# Patient Record
Sex: Female | Born: 1950 | Race: White | Hispanic: No | Marital: Married | Smoking: Former smoker
Health system: Southern US, Community
[De-identification: ages and names within clinical notes are randomized; demographics above are authoritative.]

## PROBLEM LIST (undated history)

## (undated) ENCOUNTER — Emergency Department (HOSPITAL_COMMUNITY): Admission: EM | Disposition: A | Discharge status: Home / Self Care | Payer: Medicare Other

## (undated) DIAGNOSIS — K5790 Diverticulosis of intestine, part unspecified, without perforation or abscess without bleeding: Secondary | ICD-10-CM

## (undated) DIAGNOSIS — K589 Irritable bowel syndrome without diarrhea: Secondary | ICD-10-CM

## (undated) DIAGNOSIS — F32A Depression, unspecified: Secondary | ICD-10-CM

## (undated) DIAGNOSIS — I714 Abdominal aortic aneurysm, without rupture, unspecified: Secondary | ICD-10-CM

## (undated) DIAGNOSIS — M1990 Unspecified osteoarthritis, unspecified site: Secondary | ICD-10-CM

## (undated) DIAGNOSIS — F419 Anxiety disorder, unspecified: Secondary | ICD-10-CM

## (undated) DIAGNOSIS — I2510 Atherosclerotic heart disease of native coronary artery without angina pectoris: Secondary | ICD-10-CM

## (undated) DIAGNOSIS — F329 Major depressive disorder, single episode, unspecified: Secondary | ICD-10-CM

## (undated) DIAGNOSIS — K529 Noninfective gastroenteritis and colitis, unspecified: Secondary | ICD-10-CM

## (undated) DIAGNOSIS — C801 Malignant (primary) neoplasm, unspecified: Secondary | ICD-10-CM

## (undated) DIAGNOSIS — K219 Gastro-esophageal reflux disease without esophagitis: Secondary | ICD-10-CM

## (undated) DIAGNOSIS — I10 Essential (primary) hypertension: Secondary | ICD-10-CM

## (undated) DIAGNOSIS — E785 Hyperlipidemia, unspecified: Secondary | ICD-10-CM

## (undated) DIAGNOSIS — R109 Unspecified abdominal pain: Secondary | ICD-10-CM

## (undated) HISTORY — PX: CORONARY ANGIOPLASTY WITH STENT PLACEMENT: SHX49

## (undated) HISTORY — DX: Atherosclerotic heart disease of native coronary artery without angina pectoris: I25.10

## (undated) HISTORY — DX: Noninfective gastroenteritis and colitis, unspecified: K52.9

## (undated) HISTORY — DX: Unspecified abdominal pain: R10.9

## (undated) HISTORY — DX: Malignant (primary) neoplasm, unspecified: C80.1

## (undated) HISTORY — DX: Abdominal aortic aneurysm, without rupture: I71.4

## (undated) HISTORY — PX: CERVICAL FUSION: SHX112

## (undated) HISTORY — PX: COLONOSCOPY: SHX174

## (undated) HISTORY — DX: Abdominal aortic aneurysm, without rupture, unspecified: I71.40

## (undated) HISTORY — DX: Gastro-esophageal reflux disease without esophagitis: K21.9

---

## 1993-05-16 HISTORY — PX: APPENDECTOMY: SHX54

## 1999-02-12 ENCOUNTER — Inpatient Hospital Stay (HOSPITAL_COMMUNITY): Admission: RE | Admit: 1999-02-12 | Discharge: 1999-02-17 | Payer: Self-pay | Admitting: Cardiology

## 1999-02-14 ENCOUNTER — Encounter: Payer: Self-pay | Admitting: Cardiology

## 1999-03-05 ENCOUNTER — Inpatient Hospital Stay (HOSPITAL_COMMUNITY): Admission: EM | Admit: 1999-03-05 | Discharge: 1999-03-06 | Payer: Self-pay | Admitting: Internal Medicine

## 1999-08-09 ENCOUNTER — Inpatient Hospital Stay (HOSPITAL_COMMUNITY): Admission: EM | Admit: 1999-08-09 | Discharge: 1999-08-12 | Payer: Self-pay | Admitting: Emergency Medicine

## 1999-08-09 ENCOUNTER — Encounter: Payer: Self-pay | Admitting: Cardiology

## 1999-08-10 ENCOUNTER — Encounter: Payer: Self-pay | Admitting: Cardiology

## 1999-08-12 ENCOUNTER — Encounter: Payer: Self-pay | Admitting: Cardiology

## 2000-03-04 ENCOUNTER — Inpatient Hospital Stay (HOSPITAL_COMMUNITY): Admission: EM | Admit: 2000-03-04 | Discharge: 2000-03-07 | Payer: Self-pay | Admitting: Cardiology

## 2002-09-28 ENCOUNTER — Inpatient Hospital Stay (HOSPITAL_COMMUNITY): Admission: EM | Admit: 2002-09-28 | Discharge: 2002-09-30 | Payer: Self-pay | Admitting: Internal Medicine

## 2003-01-16 ENCOUNTER — Encounter: Payer: Self-pay | Admitting: Neurosurgery

## 2003-01-21 ENCOUNTER — Ambulatory Visit (HOSPITAL_COMMUNITY): Admission: RE | Admit: 2003-01-21 | Discharge: 2003-01-22 | Payer: Self-pay | Admitting: Neurosurgery

## 2003-01-21 ENCOUNTER — Encounter: Payer: Self-pay | Admitting: Neurosurgery

## 2003-09-26 ENCOUNTER — Encounter: Admission: RE | Admit: 2003-09-26 | Discharge: 2003-09-26 | Payer: Self-pay | Admitting: Family Medicine

## 2003-11-25 ENCOUNTER — Observation Stay (HOSPITAL_COMMUNITY): Admission: RE | Admit: 2003-11-25 | Discharge: 2003-11-25 | Payer: Self-pay | Admitting: Neurosurgery

## 2004-01-23 ENCOUNTER — Inpatient Hospital Stay (HOSPITAL_COMMUNITY): Admission: RE | Admit: 2004-01-23 | Discharge: 2004-01-25 | Payer: Self-pay | Admitting: Neurosurgery

## 2004-07-13 ENCOUNTER — Encounter: Admission: RE | Admit: 2004-07-13 | Discharge: 2004-07-13 | Payer: Self-pay | Admitting: Neurosurgery

## 2004-07-27 ENCOUNTER — Encounter: Admission: RE | Admit: 2004-07-27 | Discharge: 2004-07-27 | Payer: Self-pay | Admitting: Neurosurgery

## 2004-08-10 ENCOUNTER — Encounter: Admission: RE | Admit: 2004-08-10 | Discharge: 2004-08-10 | Payer: Self-pay | Admitting: Neurosurgery

## 2004-09-02 ENCOUNTER — Ambulatory Visit: Payer: Self-pay | Admitting: Cardiology

## 2004-09-02 ENCOUNTER — Observation Stay (HOSPITAL_COMMUNITY): Admission: AD | Admit: 2004-09-02 | Discharge: 2004-09-03 | Payer: Self-pay | Admitting: Cardiology

## 2005-03-22 ENCOUNTER — Encounter: Admission: RE | Admit: 2005-03-22 | Discharge: 2005-03-22 | Payer: Self-pay | Admitting: Neurosurgery

## 2005-05-17 ENCOUNTER — Encounter: Admission: RE | Admit: 2005-05-17 | Discharge: 2005-05-17 | Payer: Self-pay | Admitting: Neurosurgery

## 2005-05-31 ENCOUNTER — Encounter: Admission: RE | Admit: 2005-05-31 | Discharge: 2005-05-31 | Payer: Self-pay | Admitting: Neurosurgery

## 2005-06-27 ENCOUNTER — Encounter: Admission: RE | Admit: 2005-06-27 | Discharge: 2005-06-27 | Payer: Self-pay | Admitting: Neurosurgery

## 2005-12-23 ENCOUNTER — Encounter: Admission: RE | Admit: 2005-12-23 | Discharge: 2005-12-23 | Payer: Self-pay | Admitting: Neurosurgery

## 2006-02-07 ENCOUNTER — Ambulatory Visit: Payer: Self-pay | Admitting: Gastroenterology

## 2006-02-13 ENCOUNTER — Encounter (INDEPENDENT_AMBULATORY_CARE_PROVIDER_SITE_OTHER): Payer: Self-pay | Admitting: Specialist

## 2006-02-13 ENCOUNTER — Ambulatory Visit: Payer: Self-pay | Admitting: Gastroenterology

## 2006-02-13 ENCOUNTER — Ambulatory Visit (HOSPITAL_COMMUNITY): Admission: RE | Admit: 2006-02-13 | Discharge: 2006-02-13 | Payer: Self-pay | Admitting: Gastroenterology

## 2006-02-21 ENCOUNTER — Ambulatory Visit: Payer: Self-pay | Admitting: Gastroenterology

## 2006-02-27 ENCOUNTER — Encounter (INDEPENDENT_AMBULATORY_CARE_PROVIDER_SITE_OTHER): Payer: Self-pay | Admitting: *Deleted

## 2006-02-27 ENCOUNTER — Ambulatory Visit (HOSPITAL_COMMUNITY): Admission: RE | Admit: 2006-02-27 | Discharge: 2006-02-27 | Payer: Self-pay | Admitting: Gastroenterology

## 2006-03-09 ENCOUNTER — Ambulatory Visit: Payer: Self-pay | Admitting: Cardiology

## 2006-03-16 ENCOUNTER — Ambulatory Visit: Payer: Self-pay | Admitting: Cardiology

## 2006-03-24 ENCOUNTER — Ambulatory Visit: Payer: Self-pay | Admitting: Cardiology

## 2006-09-23 ENCOUNTER — Ambulatory Visit: Payer: Self-pay | Admitting: Cardiology

## 2006-09-25 ENCOUNTER — Ambulatory Visit: Payer: Self-pay | Admitting: Cardiology

## 2006-12-07 ENCOUNTER — Ambulatory Visit: Payer: Self-pay | Admitting: Gastroenterology

## 2007-01-11 ENCOUNTER — Ambulatory Visit (HOSPITAL_COMMUNITY): Payer: Self-pay | Admitting: Psychiatry

## 2007-01-26 ENCOUNTER — Ambulatory Visit (HOSPITAL_COMMUNITY): Payer: Self-pay | Admitting: Psychiatry

## 2007-02-13 ENCOUNTER — Ambulatory Visit: Payer: Self-pay | Admitting: Family Medicine

## 2007-02-14 ENCOUNTER — Ambulatory Visit (HOSPITAL_COMMUNITY): Payer: Self-pay | Admitting: Psychiatry

## 2007-03-09 ENCOUNTER — Ambulatory Visit (HOSPITAL_COMMUNITY): Payer: Self-pay | Admitting: Psychiatry

## 2007-03-13 ENCOUNTER — Ambulatory Visit: Payer: Self-pay | Admitting: Gastroenterology

## 2007-05-15 ENCOUNTER — Ambulatory Visit: Payer: Self-pay | Admitting: Cardiology

## 2007-05-22 ENCOUNTER — Ambulatory Visit (HOSPITAL_COMMUNITY): Payer: Self-pay | Admitting: Psychiatry

## 2007-06-07 ENCOUNTER — Encounter: Admission: RE | Admit: 2007-06-07 | Discharge: 2007-06-07 | Payer: Self-pay | Admitting: Neurosurgery

## 2007-09-06 ENCOUNTER — Encounter: Payer: Self-pay | Admitting: Cardiology

## 2007-12-20 ENCOUNTER — Ambulatory Visit: Payer: Self-pay | Admitting: Cardiology

## 2007-12-20 ENCOUNTER — Encounter: Payer: Self-pay | Admitting: Cardiology

## 2007-12-27 ENCOUNTER — Ambulatory Visit: Payer: Self-pay | Admitting: Cardiology

## 2008-01-04 ENCOUNTER — Ambulatory Visit: Payer: Self-pay | Admitting: Cardiology

## 2008-02-01 ENCOUNTER — Ambulatory Visit: Payer: Self-pay | Admitting: Cardiology

## 2008-02-01 ENCOUNTER — Inpatient Hospital Stay (HOSPITAL_BASED_OUTPATIENT_CLINIC_OR_DEPARTMENT_OTHER): Admission: RE | Admit: 2008-02-01 | Discharge: 2008-02-01 | Payer: Self-pay | Admitting: Cardiology

## 2008-03-06 ENCOUNTER — Ambulatory Visit: Payer: Self-pay | Admitting: Cardiology

## 2008-03-18 ENCOUNTER — Encounter: Payer: Self-pay | Admitting: Cardiology

## 2008-05-16 DIAGNOSIS — C801 Malignant (primary) neoplasm, unspecified: Secondary | ICD-10-CM

## 2008-05-16 HISTORY — DX: Malignant (primary) neoplasm, unspecified: C80.1

## 2008-05-27 HISTORY — PX: MASTECTOMY: SHX3

## 2008-07-28 ENCOUNTER — Encounter: Payer: Self-pay | Admitting: Cardiology

## 2008-08-11 ENCOUNTER — Encounter: Payer: Self-pay | Admitting: Cardiology

## 2008-08-14 ENCOUNTER — Encounter: Payer: Self-pay | Admitting: Cardiology

## 2008-08-15 ENCOUNTER — Encounter: Payer: Self-pay | Admitting: Cardiology

## 2008-08-17 ENCOUNTER — Encounter: Payer: Self-pay | Admitting: Cardiology

## 2008-09-11 ENCOUNTER — Encounter: Payer: Self-pay | Admitting: Cardiology

## 2008-09-22 ENCOUNTER — Encounter: Payer: Self-pay | Admitting: Cardiology

## 2008-09-23 ENCOUNTER — Encounter: Payer: Self-pay | Admitting: Cardiology

## 2008-09-26 ENCOUNTER — Encounter: Payer: Self-pay | Admitting: Cardiology

## 2008-10-01 ENCOUNTER — Ambulatory Visit: Payer: Self-pay | Admitting: Cardiology

## 2008-10-15 ENCOUNTER — Encounter: Payer: Self-pay | Admitting: Cardiology

## 2008-10-23 ENCOUNTER — Encounter: Payer: Self-pay | Admitting: Cardiology

## 2008-10-24 ENCOUNTER — Ambulatory Visit: Payer: Self-pay | Admitting: Cardiology

## 2008-10-26 ENCOUNTER — Encounter: Payer: Self-pay | Admitting: Cardiology

## 2008-11-13 ENCOUNTER — Encounter: Payer: Self-pay | Admitting: Cardiology

## 2008-12-18 ENCOUNTER — Encounter: Payer: Self-pay | Admitting: Cardiology

## 2008-12-31 ENCOUNTER — Encounter: Payer: Self-pay | Admitting: Cardiology

## 2009-01-06 HISTORY — PX: OTHER SURGICAL HISTORY: SHX169

## 2009-02-04 ENCOUNTER — Encounter: Payer: Self-pay | Admitting: Cardiology

## 2009-02-10 ENCOUNTER — Encounter: Payer: Self-pay | Admitting: Cardiology

## 2009-03-11 ENCOUNTER — Telehealth (INDEPENDENT_AMBULATORY_CARE_PROVIDER_SITE_OTHER): Payer: Self-pay | Admitting: *Deleted

## 2009-03-12 ENCOUNTER — Encounter: Payer: Self-pay | Admitting: Cardiology

## 2009-05-11 ENCOUNTER — Ambulatory Visit: Payer: Self-pay | Admitting: Cardiology

## 2009-05-11 ENCOUNTER — Encounter: Payer: Self-pay | Admitting: Physician Assistant

## 2009-05-11 ENCOUNTER — Encounter (INDEPENDENT_AMBULATORY_CARE_PROVIDER_SITE_OTHER): Payer: Self-pay | Admitting: *Deleted

## 2009-05-11 DIAGNOSIS — J4489 Other specified chronic obstructive pulmonary disease: Secondary | ICD-10-CM | POA: Insufficient documentation

## 2009-05-11 DIAGNOSIS — K589 Irritable bowel syndrome without diarrhea: Secondary | ICD-10-CM

## 2009-05-11 DIAGNOSIS — E785 Hyperlipidemia, unspecified: Secondary | ICD-10-CM

## 2009-05-11 DIAGNOSIS — I2510 Atherosclerotic heart disease of native coronary artery without angina pectoris: Secondary | ICD-10-CM | POA: Insufficient documentation

## 2009-05-11 DIAGNOSIS — I259 Chronic ischemic heart disease, unspecified: Secondary | ICD-10-CM

## 2009-05-11 DIAGNOSIS — J449 Chronic obstructive pulmonary disease, unspecified: Secondary | ICD-10-CM

## 2009-05-11 DIAGNOSIS — F17200 Nicotine dependence, unspecified, uncomplicated: Secondary | ICD-10-CM

## 2009-05-11 DIAGNOSIS — I5032 Chronic diastolic (congestive) heart failure: Secondary | ICD-10-CM

## 2009-05-11 DIAGNOSIS — I10 Essential (primary) hypertension: Secondary | ICD-10-CM | POA: Insufficient documentation

## 2009-05-13 ENCOUNTER — Inpatient Hospital Stay (HOSPITAL_BASED_OUTPATIENT_CLINIC_OR_DEPARTMENT_OTHER): Admission: RE | Admit: 2009-05-13 | Discharge: 2009-05-13 | Payer: Self-pay | Admitting: Cardiology

## 2009-05-13 ENCOUNTER — Inpatient Hospital Stay (HOSPITAL_COMMUNITY): Admission: AD | Admit: 2009-05-13 | Discharge: 2009-05-14 | Payer: Self-pay | Admitting: Cardiology

## 2009-05-13 ENCOUNTER — Ambulatory Visit: Payer: Self-pay | Admitting: Cardiology

## 2009-05-13 ENCOUNTER — Encounter: Payer: Self-pay | Admitting: Cardiology

## 2009-05-14 ENCOUNTER — Encounter: Payer: Self-pay | Admitting: Cardiology

## 2009-06-01 ENCOUNTER — Ambulatory Visit: Payer: Self-pay | Admitting: Cardiology

## 2009-06-01 DIAGNOSIS — I959 Hypotension, unspecified: Secondary | ICD-10-CM | POA: Insufficient documentation

## 2009-06-03 ENCOUNTER — Encounter: Payer: Self-pay | Admitting: Cardiology

## 2009-06-10 ENCOUNTER — Encounter: Payer: Self-pay | Admitting: Cardiology

## 2009-07-14 ENCOUNTER — Encounter: Payer: Self-pay | Admitting: Cardiology

## 2009-07-17 ENCOUNTER — Encounter (HOSPITAL_COMMUNITY): Admission: RE | Admit: 2009-07-17 | Discharge: 2009-09-15 | Payer: Self-pay | Admitting: Neurosurgery

## 2009-07-17 ENCOUNTER — Encounter: Payer: Self-pay | Admitting: Cardiology

## 2009-08-12 ENCOUNTER — Encounter: Payer: Self-pay | Admitting: Cardiology

## 2009-09-28 ENCOUNTER — Encounter: Payer: Self-pay | Admitting: Cardiology

## 2009-12-04 ENCOUNTER — Encounter: Payer: Self-pay | Admitting: Cardiology

## 2009-12-22 ENCOUNTER — Telehealth (INDEPENDENT_AMBULATORY_CARE_PROVIDER_SITE_OTHER): Payer: Self-pay | Admitting: *Deleted

## 2010-01-13 ENCOUNTER — Encounter: Payer: Self-pay | Admitting: Cardiology

## 2010-01-26 ENCOUNTER — Emergency Department (HOSPITAL_COMMUNITY): Admission: EM | Admit: 2010-01-26 | Discharge: 2010-01-27 | Payer: Self-pay | Admitting: Emergency Medicine

## 2010-01-27 ENCOUNTER — Inpatient Hospital Stay (HOSPITAL_COMMUNITY): Admission: RE | Admit: 2010-01-27 | Discharge: 2010-01-28 | Payer: Self-pay | Admitting: Psychiatry

## 2010-01-27 ENCOUNTER — Ambulatory Visit: Payer: Self-pay | Admitting: Psychiatry

## 2010-03-11 ENCOUNTER — Telehealth (INDEPENDENT_AMBULATORY_CARE_PROVIDER_SITE_OTHER): Payer: Self-pay | Admitting: *Deleted

## 2010-03-30 ENCOUNTER — Telehealth (INDEPENDENT_AMBULATORY_CARE_PROVIDER_SITE_OTHER): Payer: Self-pay | Admitting: *Deleted

## 2010-04-02 ENCOUNTER — Encounter: Payer: Self-pay | Admitting: Cardiology

## 2010-04-13 ENCOUNTER — Encounter: Payer: Self-pay | Admitting: Cardiology

## 2010-04-16 ENCOUNTER — Encounter: Payer: Self-pay | Admitting: Cardiology

## 2010-04-17 ENCOUNTER — Encounter: Payer: Self-pay | Admitting: Cardiology

## 2010-04-19 ENCOUNTER — Encounter: Payer: Self-pay | Admitting: Cardiology

## 2010-04-29 ENCOUNTER — Encounter: Payer: Self-pay | Admitting: Cardiology

## 2010-05-03 ENCOUNTER — Ambulatory Visit: Payer: Self-pay | Admitting: Internal Medicine

## 2010-06-02 ENCOUNTER — Ambulatory Visit
Admission: RE | Admit: 2010-06-02 | Discharge: 2010-06-02 | Payer: Self-pay | Source: Home / Self Care | Attending: Cardiology | Admitting: Cardiology

## 2010-06-02 ENCOUNTER — Encounter: Payer: Self-pay | Admitting: Cardiology

## 2010-06-05 ENCOUNTER — Encounter: Payer: Self-pay | Admitting: Neurosurgery

## 2010-06-06 ENCOUNTER — Encounter: Payer: Self-pay | Admitting: Neurosurgery

## 2010-06-17 NOTE — Progress Notes (Signed)
Summary: REQUEST REFILL ON SEROQUEL  Phone Note Call from Patient Call back at Home Phone [phone]   Caller: Patient Call For: NURSE Summary of Call: message left on nurse voicemail that Degent is the physician that started the seroquel and she needed a refill on this. Nurse called and informed patient that one refill will be sent to [facility] but she needed to have PCP or psychiatrist manage/refill this med in the future. Patient verbalized understanding of plan.  Initial call taken by: Georgina Peer,  December 22, 2009 4:26 PM    Prescriptions: SEROQUEL XR 50 MG XR24H-TAB (QUETIAPINE FUMARATE) 1 tablet by mouth at bedtime  #90 x 0   Entered by:   Georgina Peer   Authorized by:   Terald Sleeper, MD, [facility]   Signed by:   Georgina Peer on 12/22/2009   Method used:   Electronically to        [facility]* (retail)             ,          Ph: [phone]       Fax: [phone]   RxIDFK:[id number]

## 2010-06-17 NOTE — Progress Notes (Signed)
Summary: FAX REQUESTING [facility] FOR PROCEDURE  Phone Note Other Incoming   Caller: FAX REQUESTING [facility]ICAL [facility]ARANCE FOR PROCEDURE Summary of Call: Pending lumbar epidural steroid injection.  Question holding Plavix & ASA.   Lovina Reach, LPN  November 15, 624THL 10:57 AM   Follow-up for Phone Call        Patient had a drug eluding stent in Jan 2011. Therefoer if we stop Plavix patient is at high risk for stent thrombosis. Delay procedure until Jan 2012 and ASA can be continued for procedure. Plavix will need be stopped 7 days before and resume immediatelly. Follow-up by: Terald Sleeper, MD, [facility],  April 09, 2010 1:01 PM  Additional Follow-up for Phone Call Additional follow up Details #1::        Will fax note back today.  Additional Follow-up by: Lovina Reach, LPN,  November 29, 624THL 9:22 AM

## 2010-06-17 NOTE — Miscellaneous (Signed)
Summary: Rehab Report/ CARDIAC REHAB DISCHARGE SUMMARY  Rehab Report/ CARDIAC REHAB DISCHARGE SUMMARY   Imported By: Bartholomew Boards 08/27/2009 10:53:18  _____________________________________________________________________  External Attachment:    Type:   Image     Comment:   External Document

## 2010-06-17 NOTE — Letter (Signed)
Summary: Letter/ FAXED [place]  Letter/ FAXED [facility]   Imported By: Bartholomew Boards 04/14/2010 10:51:53  _____________________________________________________________________  External Attachment:    Type:   Image     Comment:   External Document

## 2010-06-17 NOTE — Assessment & Plan Note (Signed)
Summary: 79mth f/u [place]   Visit Type:  Follow-up Primary Provider:  Jerene Bears, MD   History of Present Illness: the patient is a 60 year old female with a history of coronary artery disease she status post drug-eluting stent to the right coronary artery for instent restenosis. Unfortunately she has begun smoking again. She denies however substernal chest pain. She denies any orthopnea PND. She still continues to progress with occasional bowel cramps, bloating and diarrhea. She is under treatment for irritable bowel syndrome. She does complain of easy bruisability on full dose aspirin and Plavix. Otherwise from a cardiovascular perspective she staples. In particular she denies any orthopnea PND palpitations or syncope.  Preventive Screening-Counseling & Management  Alcohol-Tobacco     Smoking Status: current     Packs/Day: 1.5  Current Medications (verified): 1)  Dicyclomine Hcl 20 Mg Tabs (Dicyclomine Hcl) .... Take 1 Tablet By Mouth Three Times A Day 2)  Diovan 320 Mg Tabs (Valsartan) .... Take 1/2 Tablet Daily (160mg ) 3)  Effexor Xr 150 Mg Xr24h-Cap (Venlafaxine Hcl) .... Take 1 Capsule By Mouth At Bedtime 4)  Lasix 40 Mg Tabs (Furosemide) .... Take 1 Tablet By Mouth Every Morning As Needed 5)  Pravastatin Sodium 40 Mg Tabs (Pravastatin Sodium) .Marland Kitchen.. 1 Tablet By Mouth Every Night 6)  Seroquel Xr 50 Mg Xr24h-Tab (Quetiapine Fumarate) .Marland Kitchen.. 1 Tablet By Mouth At Bedtime 7)  Hydrochlorothiazide 25 Mg Tabs (Hydrochlorothiazide) .... Take One Tablet By Mouth Daily. 8)  Protonix 40 Mg Tbec (Pantoprazole Sodium) .... Take 1 Tablet By Mouth Once A Day 9)  Lotronex 1 Mg Tabs (Alosetron Hcl) .... Take 1 Tablet By Mouth Two Times A Day As Needed 10)  Vitamin E 400 Unit Caps (Vitamin E) .... Take 1 Capsule By Mouth Once A Day 11)  Coenzyme Q10 100 Mg Caps (Coenzyme Q10) .... Take 1 Capsule By Mouth Once A Day 12)  Mega Red .... Take 1 Tablet By Mouth Once A Day 13)  Aspirin 81 Mg Tbec (Aspirin) ....  Take 1 Tablet By Mouth Once A Day 14)  Nystatin 100000 Unit/ml Susp (Nystatin) .... Swish and Swallow 4 Times Per Day 15)  Nitrostat 0.4 Mg Subl (Nitroglycerin) .... Dissolve One Tablet Under Tongue For Severe Chest Pain As Needed Every 5 Minutes, Not To Exceed 3 in 15 Min Time Frame 16)  Gabapentin 600 Mg Tabs (Gabapentin) .... Take 1 Tablet By Mouth Three Times A Day 17)  Diazepam 5 Mg Tabs (Diazepam) .... Take 1 Tablet By Mouth Two Times A Day 18)  Bystolic 5 Mg Tabs (Nebivolol Hcl) .... Take 1 Tablet By Mouth Once A Day 19)  Soma 350 Mg Tabs (Carisoprodol) .... Take 1 Tab By Mouth At Bedtime 20)  Promethazine Hcl 25 Mg Tabs (Promethazine Hcl) .... As Needed 21)  Lomotil 2.5-0.025 Mg Tabs (Diphenoxylate-Atropine) .... Take 4 Tablets Per Day 22)  Loratadine 10 Mg Tabs (Loratadine) .... As Needed Alternates With Zyrtec 23)  Zyrtec Allergy 10 Mg Tabs (Cetirizine Hcl) .... As Needed Alternate With Claritin 24)  Percocet 7.5-500 Mg Tabs (Oxycodone-Acetaminophen) .... As Needed Pain 25)  Plavix 75 Mg Tabs (Clopidogrel Bisulfate) .... Take 1 Tablet By Mouth Once A Day 26)  Benzonatate 200 Mg Caps (Benzonatate) .... Take 1 Capsule By Mouth Three Times A Day 27)  Align  Caps (Probiotic Product) .... Take 1 Tablet By Mouth Once A Day 28)  Centrum Silver  Tabs (Multiple Vitamins-Minerals) .... Take 1 Tablet By Mouth Once A Day  Allergies (verified): 1)  Zestril 2)  Lopressor 3)  Cardura 4)  Norvasc 5)  Penicillin  Comments:  Nurse/Medical Assistant: The patient's medications and allergies were reviewed with the patient and were updated in the Medication and Allergy Lists. reviewed list w/ pt. Tammi Romine CMA (June 02, 2010 1:12 PM)  Past History:  Past Medical History: Last updated: 03/09/2008 coronary artery diseasewith history of stents to the left anterior descending artery and ostial right coronary artery in 2000 with patent stents in 2007. cardiac catheterization 9/18 /2009, new  significant lesion in the right coronary artery.intervention pending evaluation of resting hypoxemia. History of normal LV function. Dyslipidemia tobacco use history of depression hronic back pain hypertension diastolic heart failure severe irritable bowel syndrome IBS with diarrhea  Past Surgical History: Last updated: 03/09/2008 neck surgery x 6 for ruptured disks appendectomy 1995 history of back surgery coloscopy 2007 with Dr. Stann Mainland.  Irritable bowel syndrome with diarrhea.  Prior colonoscopies in the past by Dr. Lindalou Hose and Dr. the Cornelia Copa.   EGD with Dr. Stann Mainland in 2007 for vomiting blood negative per patient.  Family History: Last updated: 03/09/2008 significant for father who died o fmyocardial infarction at the age of 31 brother had a myocardial infarction at the age of 59.  She has another brother who is severe peripheral vascular disease.  Social History: Last updated: 03/09/2008 the patient is married.  She uses alcohol occasionally.  She denies extracurricular drug use.  She formerly smoked for 30 years approximately 1 1/2 pack per day.  She quit smoking back in 2002.  Social History: Packs/Day:  1.5  Review of Systems       The patient complains of weight gain/loss, abdominal pain, and diarrhea.  The patient denies fatigue, malaise, fever, vision loss, decreased hearing, hoarseness, chest pain, palpitations, shortness of breath, prolonged cough, wheezing, sleep apnea, coughing up blood, blood in stool, nausea, vomiting, heartburn, incontinence, blood in urine, muscle weakness, joint pain, leg swelling, rash, skin lesions, headache, fainting, dizziness, depression, anxiety, enlarged lymph nodes, easy bruising or bleeding, and environmental allergies.    Vital Signs:  Patient profile:   60 year old female Height:      63 inches Weight:      134 pounds BMI:     23.82 Pulse rate:   84 / minute BP sitting:   160 / 89  (left arm) Cuff size:   regular  Vitals Entered  By: Annett Gula CMA (June 02, 2010 1:12 PM)  Physical Exam  Additional Exam:  General: Well-developed, well-nourished in no distress head: Normocephalic and atraumatic eyes PERRLA/EOMI intact, conjunctiva and lids normal nose: No deformity or lesions mouth normal dentition, normal posterior pharynx neck: Supple, no JVD.  No masses, thyromegaly or abnormal cervical nodes lungs: Normal breath sounds bilaterally without wheezing.  Normal percussion heart: regular rate and rhythm with normal S1 and S2, no S3 or S4.  PMI is normal.  No pathological murmurs abdomen: Normal bowel sounds, abdomen is soft and nontender without masses, organomegaly or hernias noted.  No hepatosplenomegaly musculoskeletal: Back normal, normal gait muscle strength and tone normal pulsus: Pulse is normal in all 4 extremities Extremities: No peripheral pitting edema neurologic: Alert and oriented x 3 skin: Intact without lesions or rashes cervical nodes: No significant adenopathy psychologic: Normal affect    Impression & Recommendations:  Problem # 1:  HYPOTENSION (ICD-458.9) resolved.  Problem # 2:  DYSLIPIDEMIA (ICD-272.4) patient remained on statin drug therapy. She will follow up with her primary care physician Her updated  medication list for this problem includes:    Pravastatin Sodium 40 Mg Tabs (Pravastatin sodium) .Marland Kitchen... 1 tablet by mouth every night  Problem # 3:  TOBACCO ABUSE (ICD-305.1) I counseled the patient regarding her tobacco use.  Problem # 4:  IRRITABLE BOWEL SYNDROME (ICD-564.1) I recommended to the patient the use of Iberogast.  Problem # 5:  CORONARY ARTERY DISEASE, S/P PTCA (ICD-414.9) no recurrent chest pain. The patient will remain on Plavix indefinitely. Her updated medication list for this problem includes:    Aspirin 81 Mg Tbec (Aspirin) .Marland Kitchen... Take 1 tablet by mouth once a day    Nitrostat 0.4 Mg Subl (Nitroglycerin) .Marland Kitchen... Dissolve one tablet under tongue for severe chest  pain as needed every 5 minutes, not to exceed 3 in 15 min time frame    Bystolic 5 Mg Tabs (Nebivolol hcl) .Marland Kitchen... Take 1 tablet by mouth once a day    Plavix 75 Mg Tabs (Clopidogrel bisulfate) .Marland Kitchen... Take 1 tablet by mouth once a day  Patient Instructions: 1)  Decrease Aspirin to 81mg  daily 2)  Follow up in  6 months Prescriptions: PLAVIX 75 MG TABS (CLOPIDOGREL BISULFATE) Take 1 tablet by mouth once a day  #90 x 3   Entered by:   Lovina Reach, LPN   Authorized by:   Terald Sleeper, MD, [facility]   Signed by:   Lovina Reach, LPN on 624THL   Method used:   Faxed to ...       [facility] (mail-order)             , [place]         Ph: [phone]       Fax: [phone]   RxID:   [id number]

## 2010-06-17 NOTE — Progress Notes (Signed)
Summary: sycopal episode & slurred speech  Phone Note Call from Patient   Caller: [name] Reason for Call: Talk to Nurse Summary of Call: called to report her blood pressure is 87/40. Wants to know what to do?  can reach her at [phone] Initial call taken by: Neoma Laming,  March 11, 2010 12:37 PM  Follow-up for Phone Call        Now BP is 103/51.  States she fell earlier this morning and the bp was taken after her fall.  Noticed speech sounded slurred and questioned her about this.  States she is more slurred today than usual.  Today has felt very tired and weak.  Advised pt to go to ED for evaluation.  Patient verbalized understanding.  Follow-up by: Lovina Reach, LPN,  October 27, 624THL 5:07 PM  Additional Follow-up for Phone Call Additional follow up Details #1::        Agree

## 2010-06-17 NOTE — Assessment & Plan Note (Signed)
Summary: eph d/c [facility] 12-30   Visit Type:  Follow-up Primary Provider:  Jerene Bears, MD  CC:  follow-up visit.  History of Present Illness: the patient is a 60-year-old female with a history of coronary artery disease. She was recently evaluated and referred to the catheter lab. She was found to have a 70-80% stenosis of the right coronary artery but developed chest pain on the table. She was taken downstairs to the lab for IVIS and was found to have an high-grade in-stent restenosis of the RCA. She was treated with PTCA and drug-eluting stent. The patient at the prior stent placed in the LAD and right coronary artery in 2000. She states that now her chest pain is completely resolved. She feels much improved. She is taking both aspirin and Plavix. She reports no further chest pain. She has started cardiac rehabilitation. She is able to exercise without any dyspnea. She knows though that her blood pressure is rather low in the morning and is associated with dizziness. She now also takes Lotronex for her irritable bowel syndrome. She was given Seroquel for the latter diagnosis at [facility] by Dr. Matilde Bash.unfortunately, her gastroenterologist added amitriptyline to this medical regimen. She is questioning me whether she can safely use both drugs together. She denies any orthopnea PND has no palpitations or syncope.the patient reports no groin complications after her catheterization   Preventive Screening-Counseling & Management  Alcohol-Tobacco     Smoking Status: current     Packs/Day: 0.5  Comments: Pt states had stopped smoking 8 yrs ago but started back before Christmas.  Current Medications (verified): 1)  Dicyclomine Hcl 20 Mg Tabs (Dicyclomine Hcl) .... Take 1 Tablet By Mouth Three Times A Day 2)  Diovan 320 Mg Tabs (Valsartan) .... Take 1/2 Tablet Daily (160mg ) 3)  Effexor Xr 150 Mg Xr24h-Cap (Venlafaxine Hcl) .... Take 1 Capsule By Mouth At Bedtime 4)  Lasix 40 Mg Tabs  (Furosemide) .... Take 1 Tablet By Mouth Every Morning As Needed 5)  Pravastatin Sodium 40 Mg Tabs (Pravastatin Sodium) .Marland Kitchen.. 1 Tablet By Mouth Every Night 6)  Seroquel Xr 50 Mg Xr24h-Tab (Quetiapine Fumarate) .Marland Kitchen.. 1 Tablet By Mouth At Bedtime 7)  Hydrochlorothiazide 25 Mg Tabs (Hydrochlorothiazide) .... Take One Tablet By Mouth Daily. 8)  Protonix 40 Mg Tbec (Pantoprazole Sodium) .... Take 1 Tablet By Mouth Once A Day 9)  Lotronex 1 Mg Tabs (Alosetron Hcl) .... Take 1 Tablet By Mouth Two Times A Day As Needed 10)  Vitamin E 400 Unit Caps (Vitamin E) .... Take 1 Capsule By Mouth Once A Day 11)  Coenzyme Q10 100 Mg Caps (Coenzyme Q10) .... Take 1 Capsule By Mouth Once A Day 12)  Mega Red .... Take 1 Tablet By Mouth Once A Day 13)  Aspirin 325 Mg Tabs (Aspirin) .... Take 1 Tablet By Mouth Once A Day 14)  Amitriptyline Hcl 75 Mg Tabs (Amitriptyline Hcl) .... Take 1 Tab By Mouth At Bedtime 15)  Nystatin 100000 Unit/ml Susp (Nystatin) .... Swish and Swallow 4 Times Per Day 16)  Nitrostat 0.4 Mg Subl (Nitroglycerin) .... Dissolve One Tablet Under Tongue For Severe Chest Pain As Needed Every 5 Minutes, Not To Exceed 3 in 15 Min Time Frame 17)  Gabapentin 600 Mg Tabs (Gabapentin) .... Take 1 Tablet By Mouth Three Times A Day 18)  Diazepam 5 Mg Tabs (Diazepam) .... Take 1 Tablet By Mouth Two Times A Day 19)  Bystolic 5 Mg Tabs (Nebivolol Hcl) .... Take 1 Tablet  By Mouth Once A Day 20)  Soma 350 Mg Tabs (Carisoprodol) .... Take 1 Tab By Mouth At Bedtime 21)  Promethazine Hcl 25 Mg Tabs (Promethazine Hcl) .... As Needed 22)  Lomotil 2.5-0.025 Mg Tabs (Diphenoxylate-Atropine) .... Take 4 Tablets Per Day 23)  Hyomax 0.125 Mg Tabs (Hyoscyamine Sulfate) .... As Needed 24)  Loratadine 10 Mg Tabs (Loratadine) .... As Needed Alternates With Zyrtec 25)  Zyrtec Allergy 10 Mg Tabs (Cetirizine Hcl) .... As Needed Alternate With Claritin 26)  Percocet 7.5-500 Mg Tabs (Oxycodone-Acetaminophen) .... As Needed Pain 27)   Plavix 75 Mg Tabs (Clopidogrel Bisulfate) .... Take 1 Tablet By Mouth Once A Day 28)  Cranberry 500 Mg Caps (Cranberry) .... Take 1 Capsule By Mouth Once A Day 29)  Benzonatate 200 Mg Caps (Benzonatate) .... Take 1 Capsule By Mouth Three Times A Day 30)  Potassium Chloride Cr 10 Meq Cr-Caps (Potassium Chloride) .... Take One Tablet By Mouth As Needed With Lasix 31)  Fluconazole 100 Mg Tabs (Fluconazole) .... Take 1 Tablet By Mouth Once A Day 32)  Align  Caps (Probiotic Product) .... Take 1 Tablet By Mouth Once A Day  Allergies: 1)  Zestril 2)  Lopressor 3)  Cardura 4)  Norvasc 5)  Penicillin  Comments:  Nurse/Medical Assistant: The patient's medications were reviewed with the patient and were updated in the Medication List. Pt brought list of medications to office visit.  Past History:  Past Medical History: Last updated: 03/09/2008 coronary artery diseasewith history of stents to the left anterior descending artery and ostial right coronary artery in 2000 with patent stents in 2007. cardiac catheterization 9/18 /2009, new significant lesion in the right coronary artery.intervention pending evaluation of resting hypoxemia. History of normal LV function. Dyslipidemia tobacco use history of depression hronic back pain hypertension diastolic heart failure severe irritable bowel syndrome IBS with diarrhea  Past Surgical History: Last updated: 03/09/2008 neck surgery x 6 for ruptured disks appendectomy 1995 history of back surgery coloscopy 2007 with Dr. Stann Mainland.  Irritable bowel syndrome with diarrhea.  Prior colonoscopies in the past by Dr. Lindalou Hose and Dr. the Cornelia Copa.   EGD with Dr. Stann Mainland in 2007 for vomiting blood negative per patient.  Family History: Last updated: 03/09/2008 significant for father who died o fmyocardial infarction at the age of 27 brother had a myocardial infarction at the age of 66.  She has another brother who is severe peripheral vascular  disease.  Social History: Last updated: 03/09/2008 the patient is married.  She uses alcohol occasionally.  She denies extracurricular drug use.  She formerly smoked for 30 years approximately 1 1/2 pack per day.  She quit smoking back in 2002.  Risk Factors: Smoking Status: current (06/01/2009) Packs/Day: 0.5 (06/01/2009)  Social History: Packs/Day:  0.5  Review of Systems       The patient complains of abdominal pain and diarrhea.  The patient denies fatigue, malaise, fever, weight gain/loss, vision loss, decreased hearing, hoarseness, chest pain, palpitations, shortness of breath, prolonged cough, wheezing, sleep apnea, coughing up blood, blood in stool, nausea, vomiting, heartburn, incontinence, blood in urine, muscle weakness, joint pain, leg swelling, rash, skin lesions, headache, fainting, dizziness, depression, anxiety, enlarged lymph nodes, easy bruising or bleeding, and environmental allergies.    Vital Signs:  Patient profile:   60 year old female Height:      63 inches Weight:      161.75 pounds Pulse rate:   84 / minute BP sitting:   124 / 84  (  left arm) Cuff size:   regular  Vitals Entered By: Gurney Maxin, RN, BSN (June 01, 2009 10:48 AM) CC: follow-up visit   Physical Exam  Additional Exam:  General: Well-developed, well-nourished in no distress head: Normocephalic and atraumatic eyes PERRLA/EOMI intact, conjunctiva and lids normal nose: No deformity or lesions mouth normal dentition, normal posterior pharynx neck: Supple, no JVD.  No masses, thyromegaly or abnormal cervical nodes lungs: Normal breath sounds bilaterally without wheezing.  Normal percussion heart: regular rate and rhythm with normal S1 and S2, no S3 or S4.  PMI is normal.  No pathological murmurs abdomen: Normal bowel sounds, abdomen is soft and nontender without masses, organomegaly or hernias noted.  No hepatosplenomegaly musculoskeletal: Back normal, normal gait muscle strength and tone  normal pulsus: Pulse is normal in all 4 extremities Extremities: No peripheral pitting edema neurologic: Alert and oriented x 3 skin: Intact without lesions or rashes cervical nodes: No significant adenopathy psychologic: Normal affect    Cardiac Cath  Procedure date:  05/13/2009  Findings:      RESULTS:  Initially, stenosis within the stent and distal to the stent were estimated at 80%.  Following stenting, these improved to 0%.   CONCLUSION:  Successful percutaneous coronary intervention of the in- stent and peri-stent restenotic lesion in the ostium of the right coronary artery using intracoronary vascular ultrasound guidance, and a Xience drug-eluting stent with improvement in center narrowing from 80% to 0%.  Impression & Recommendations:  Problem # 1:  CORONARY ARTERY DISEASE, S/P PTCA (ICD-414.9) the patient is status post drug-eluting stent placement for instent restenosis of the right coronary artery her symptoms of chest pain have resolved. She'll be continued on aspirin and Plavix for at least a year. The following medications were removed from the medication list:    Isosorbide Mononitrate Cr 60 Mg Xr24h-tab (Isosorbide mononitrate) .Marland Kitchen... Take one tablet by mouth daily Her updated medication list for this problem includes:    Aspirin 325 Mg Tabs (Aspirin) .Marland Kitchen... Take 1 tablet by mouth once a day    Nitrostat 0.4 Mg Subl (Nitroglycerin) .Marland Kitchen... Dissolve one tablet under tongue for severe chest pain as needed every 5 minutes, not to exceed 3 in 15 min time frame    Bystolic 5 Mg Tabs (Nebivolol hcl) .Marland Kitchen... Take 1 tablet by mouth once a day    Plavix 75 Mg Tabs (Clopidogrel bisulfate) .Marland Kitchen... Take 1 tablet by mouth once a day  Problem # 2:  DYSLIPIDEMIA (ICD-272.4) the patient is a statin drug therapy. This is followed by her primary care physician. Her updated medication list for this problem includes:    Pravastatin Sodium 40 Mg Tabs (Pravastatin sodium) .Marland Kitchen... 1 tablet by  mouth every night  Problem # 3:  HYPOTENSION (ICD-458.9) I stopped the patient's isosorbide mononitrate. I also decreased her dose of Diovan 260 minutes p.o. q. daily.  Problem # 4:  IRRITABLE BOWEL SYNDROME (ICD-564.1) I also told the patient to discontinue her amitriptyline as this cannot be used in combination with venlafaxine and Seroquel.  Patient Instructions: 1)  Stop Amitriptyline 2)  Stop Imdur 3)  Decrease Diovan to 160mg  daily 4)  Follow up in 6 months.

## 2010-06-17 NOTE — Letter (Signed)
Summary: [place] [facility] D/C DR. DHRUV VYAS  [facility] D/C DR. DHRUV VYAS   Imported By: Delfino Lovett 06/02/2010 10:28:13  _____________________________________________________________________  External Attachment:    Type:   Image     Comment:   External Document

## 2010-06-17 NOTE — Letter (Signed)
Summary: External Correspondence/ [place]  External Correspondence/ [place]   Imported By: Bartholomew Boards 06/02/2010 10:40:02  _____________________________________________________________________  External Attachment:    Type:   Image     Comment:   External Document

## 2010-06-17 NOTE — Letter (Signed)
Summary: External Correspondence/ [place]  External Correspondence/ [place]   Imported By: Bartholomew Boards 12/18/2009 11:31:06  _____________________________________________________________________  External Attachment:    Type:   Image     Comment:   External Document

## 2010-06-17 NOTE — Letter (Signed)
Summary: Letter/ [facility]  Letter/ [facility]   Imported By: Bartholomew Boards 04/06/2010 15:24:00  _____________________________________________________________________  External Attachment:    Type:   Image     Comment:   External Document

## 2010-06-17 NOTE — Medication Information (Signed)
Summary: [place] D/C MEDICATION SHEET ORDER  [place] D/C MEDICATION SHEET ORDER   Imported By: Delfino Lovett 06/02/2010 10:13:16  _____________________________________________________________________  External Attachment:    Type:   Image     Comment:   External Document

## 2010-06-17 NOTE — Consult Note (Signed)
Summary: CARDIOLOGY CONSULT/ [facility]/ [place]   Imported By: Delfino Lovett 06/02/2010 10:11:46  _____________________________________________________________________  External Attachment:    Type:   Image     Comment:   External Document

## 2010-06-23 NOTE — Medication Information (Signed)
Summary: RX Folder/ [facility]  RX Folder/ [place]   Imported By: Bartholomew Boards 06/15/2010 11:29:39  _____________________________________________________________________  External Attachment:    Type:   Image     Comment:   External Document

## 2010-07-29 LAB — URINALYSIS, ROUTINE W REFLEX MICROSCOPIC
Glucose, UA: NEGATIVE mg/dL
Ketones, ur: NEGATIVE mg/dL
Protein, ur: NEGATIVE mg/dL
Urobilinogen, UA: 0.2 mg/dL (ref 0.0–1.0)

## 2010-07-29 LAB — COMPREHENSIVE METABOLIC PANEL
ALT: 23 U/L (ref 0–35)
AST: 25 U/L (ref 0–37)
Albumin: 3.6 g/dL (ref 3.5–5.2)
CO2: 29 mEq/L (ref 19–32)
Calcium: 10.9 mg/dL — ABNORMAL HIGH (ref 8.4–10.5)
Creatinine, Ser: 0.61 mg/dL (ref 0.4–1.2)
GFR calc Af Amer: >60 mL/min (ref >60)
Sodium: 142 mEq/L (ref 135–145)
Total Protein: 7.4 g/dL (ref 6.0–8.3)

## 2010-07-29 LAB — DIFFERENTIAL
Eosinophils Absolute: 0.1 10*3/uL (ref 0.0–0.7)
Eosinophils Relative: 1 % (ref 0–5)
Lymphocytes Relative: 26 % (ref 12–46)
Lymphs Abs: 2.2 10*3/uL (ref 0.7–4.0)
Monocytes Relative: 8 % (ref 3–12)

## 2010-07-29 LAB — RAPID URINE DRUG SCREEN, HOSP PERFORMED
Amphetamines: NOT DETECTED
Barbiturates: POSITIVE — AB
Benzodiazepines: POSITIVE — AB
Tetrahydrocannabinol: NOT DETECTED

## 2010-07-29 LAB — CBC
MCH: 28.9 pg (ref 26.0–34.0)
MCV: 87.6 fL (ref 78.0–100.0)
Platelets: 382 10*3/uL (ref 150–400)
RDW: 17.2 % — ABNORMAL HIGH (ref 11.5–15.5)
WBC: 8.6 10*3/uL (ref 4.0–10.5)

## 2010-07-29 LAB — URINE MICROSCOPIC-ADD ON

## 2010-07-29 LAB — ACETAMINOPHEN LEVEL: Acetaminophen (Tylenol), Serum: <10 ug/mL — ABNORMAL LOW (ref 10–30)

## 2010-08-16 LAB — BASIC METABOLIC PANEL
CO2: 24 mEq/L (ref 19–32)
Chloride: 110 mEq/L (ref 96–112)
GFR calc Af Amer: >60 mL/min (ref >60)
Potassium: 4.4 mEq/L (ref 3.5–5.1)

## 2010-08-16 LAB — LIPID PANEL
HDL: 42 mg/dL (ref >39)
Total CHOL/HDL Ratio: 4.2 RATIO
VLDL: 34 mg/dL (ref 0–40)

## 2010-08-16 LAB — CBC
HCT: 29.3 % — ABNORMAL LOW (ref 36.0–46.0)
MCHC: 34.2 g/dL (ref 30.0–36.0)
MCV: 92.8 fL (ref 78.0–100.0)
RBC: 3.16 MIL/uL — ABNORMAL LOW (ref 3.87–5.11)
WBC: 7.4 10*3/uL (ref 4.0–10.5)

## 2010-08-17 ENCOUNTER — Ambulatory Visit (INDEPENDENT_AMBULATORY_CARE_PROVIDER_SITE_OTHER): Payer: Medicare Other | Admitting: Internal Medicine

## 2010-08-17 DIAGNOSIS — R197 Diarrhea, unspecified: Secondary | ICD-10-CM

## 2010-08-30 NOTE — Consult Note (Signed)
NAMESHAWNELLE, [name] [name]                ACCOUNT NO.:  [id number]  MEDICAL RECORD NO.:  [id number]          PATIENT TYPE: AMB.  LOCATION: [place].                                [facility]  PHYSICIAN:  Hildred Laser, M.D.    DATE OF BIRTH:  Oct 01, 1950  DATE : 08/17/2010.                                 CONSULTATION   PRESENTING COMPLAINT:  Diarrhea and lower abdominal cramps.  SUBJECTIVE:  [name] [name] is a 60 year old Caucasian female patient of Dr. Woody Seller, who is here for scheduled visit.  She was last seen on May 03, 2010.  She has had chronic diarrhea and lower abdominal pain.  She had fairly extensive workup including stool studies and multiple colonoscopies most recent which was in August 2010, and biopsies from the sigmoid colon for negative for microscopic or collagenous colitis.  She also had small bowel Given capsule study and no abnormality was noted to her small bowel.  She has few specks of coffee-ground in her stomach, but no frank bleeding was noted.  Last year, she was treated with Lotronex and she had normal bowel movements 1-2 a day for few months.  She stopped the medicine at some point thinking that it was not working.  Now she returns stating that she is back to score once she is having anywhere from 4-15 stools per day.  Most of her stools are formed, but when she flushes the commode, stool break into pieces.  She has occasionally noted blood on the tissue, but no frank rectal bleeding or melena.  She goes to bed around 11, and wakes up at 7 and very rarely has to get up to have a bowel movement.  Her appetite is good, but she is afraid to eat.  She states she enjoy eating habits and she believes she is eating very healthy foods.  She has lost 14 pounds since the last visit and she has lost 28 pounds in 1 year.  She continues to experience pain or cramps across the lower abdomen.  She denies fever, chills, nausea, or vomiting.  She states she took 4  Lomotil tablets all one time this morning.  She has already had four bowel movements.  She has not been constipated.  She is requesting prescription for Lomotil.  She is willing to go back on Lotronex.  CURRENT MEDICATIONS: 1. Fiorinal  1-2 every 6 hours as needed. 2. Diazepam 5 mg t.i.d. 3. Lomotil 1-4 tablets daily p.r.n. 4. Bystolic 5 mg daily. 5. Promethazine 25 mg daily p.r.n. 6. Carisoprodol  350 mg p.o. daily p.r.n. 7. Nitrostat 0.4 mg sublingual p.r.n. 8. Coricidin one p.o. daily p.r.n. 9. Metamucil 5 tablets in a.m., 3 at lunch, and 5 at bedtime. 10.Diovan 320 mg p.o. daily when systolic blood pressure is greater     than 140. 11.Effexor 150 mg p.o. q.a.m. 12.Seroquel 50 mg p.o. bedtime. 13.Magnesium 250 mg p.o. daily. 14.Zyrtec 10 mg daily. 15.Cranberry b.i.d. 16.Acai one b.i.d. 17.Dicyclomine 20 mg t.i.d. p.r.n. 18.Endocet 10/325, one q.4 p.r.n. 19.Muro 128 one drop to each eye daily. 20.Lasix 40 mg daily. 21.Vitamin E 4 units  daily. 22.Travel sickness 25 mg daily p.r.n. 23.Pantoprazole 40 mg p.o. q.a.m. 24.Gabapentin 600 mg p.o. t.i.d. 25.Fluticasone nasal spray p.r.n. 26.CoQ10 100 mg daily. 27.Percogesic one p.o. daily p.r.n. 28.Plavix 75 mg p.o. daily. 29.Asa 81 mg daily. 30.Imodium OTC p.r.n. which she alternates with Lomotil. 31.Tums p.r.n. 32.Septra DS one b.i.d. she will finish soon.  OBJECTIVE:  VITAL SIGNS:  Weight 131 pounds, she is 63 inches tall, pulse 70 per minute, blood pressure 118/68, temp is 99.2. HEENT:  Conjunctivae is pink.  Sclerae nonicteric.  Oropharyngeal mucosa is normal.  No neck masses or thyromegaly noted.  LUNGS:  Clear to auscultation. CARDIAC:  With regular rhythm.  Normal S1 and S3.  No murmur or gallop noted. ABDOMEN:  Flat.  Bowel sounds are normal.  On palpation soft abdomen with mild tenderness across lower abdomen.  No organomegaly or masses. EXTREMITIES:  No peripheral edema or clubbing noted.  ASSESSMENT:  [name] [name]  has chronic or recurrent diarrhea with lower abdominal cramps.  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  She had excellent response to Lotronex last year and I am not sure that what happened maybe she quit taking the medicine.  She is losing weight.  I have never seen her weight this much before.  This may be because she is afraid to eat or she could have malabsorptive syndrome.  RECOMMENDATIONS:  The patient advised to discontinue magnesium and all other OTC medications.  The patient advised to take her diazepam and narcotic once scheduled rather than p.r.n. basis because it could have an effect on GI motility one way or the other.  We will do 24-hour fecal collection for stool, weight, and FAST.  The patient advised to try to eat regular food for few days prior to stool collection and she should not take antidiarrheal on the day she collect stool.  Prescription given for Lomotil 2 tablets b.i.d. 120 with one refill to be felt at 1 month.  New prescription given for Protonix 40 mg p.o. #90 with 3 refills.  We will start on Lotronex.  All of her questions were answered and she has signed. the patient acknowledgment form.  She will take 1 mg once or twice daily.  Prescription given for 60, 2 refills.  The patient advised to keep stool diary and we will plan to see her back in few weeks.     Hildred Laser, M.D.     NR/MEDQ  D:  08/17/2010  T:  08/18/2010  Job:  [id number]  cc:   Dr. Woody Seller  Electronically Signed by Hildred Laser M.D. on 08/29/2010 11:37:25 PM

## 2010-09-21 ENCOUNTER — Ambulatory Visit (INDEPENDENT_AMBULATORY_CARE_PROVIDER_SITE_OTHER): Payer: Medicare Other | Admitting: Internal Medicine

## 2010-09-21 DIAGNOSIS — R109 Unspecified abdominal pain: Secondary | ICD-10-CM

## 2010-09-21 DIAGNOSIS — R197 Diarrhea, unspecified: Secondary | ICD-10-CM

## 2010-09-21 HISTORY — DX: Unspecified abdominal pain: R10.9

## 2010-09-27 ENCOUNTER — Ambulatory Visit (INDEPENDENT_AMBULATORY_CARE_PROVIDER_SITE_OTHER): Payer: Medicare Other | Admitting: Internal Medicine

## 2010-09-28 NOTE — Assessment & Plan Note (Signed)
[facility]                          [facility] NOTE   [name] [name], Demayo DINEEN TROJANOWSKI                       MRN:          [id number]  DATE:10/01/2008                            DOB:          [birth year]    REFERRING PHYSICIAN:  Jerene Bears, MD   HISTORY OF PRESENT ILLNESS:  The patient is a 60 year old female with  irritable bowel syndrome as well as coronary artery disease.  She was  also seen last in the office for a presumed resting hypoxemia by Dr.  Lia Foyer.  This was noticed during her last catheterization, the patient  had patent stents, but still had a 70-75% narrowing in the right  coronary artery.  The patient states she takes occasional nitroglycerin,  but her use has not been increased.   The patient also been diagnosed recently with breast cancer, she is on  chemotherapy.  She has had fever secondary to bladder infection that is  followed closely by her oncologist.  She also has an abscess tooth for  which she will see the oral surgeon later today.  The patient's blood  pressure remains elevated.  From an IBS standpoint, however, symptoms  have markedly improved after she was seen in [facility] and she was  placed on Seroquel in addition to change that I made to Effexor.  She  also continues to use Bentyl and peppermint oil.  She states that latter  problem has significantly improved.   MEDICATIONS:  1. Nexium 40 mg a day.  2. Aspirin 81 mg a day.  3. Vicodin 10/325 mg p.o. p.r.n.  4. Neurontin 600 mg p.o. t.i.d.  5. Coenzyme Q10 150 daily.  6. Diovan 320 daily.  7. Pravachol 40 daily.  8. Align daily.  9. Bentyl 20 mg p.o. t.i.d.  10.Effexor 150 mg p.o. nightly.  11.Zyrtec 10 mg p.o. daily.  12.Imdur 30 p.o. nightly.  I asked the patient to change to q.a.m.  13.Bystolic 5 mg p.o. daily.  14.Seroquel 50 mg p.o. nightly.  15.Peppermint oil tablet 3 times a day before meals.   PHYSICAL EXAMINATION:  VITAL SIGNS:  Blood pressure  160/98, heart rate  84, and weight is 61.  NECK:  Normal carotid upstroke and no carotid bruits.  LUNGS:  Diminished breath sounds bilaterally with no crackles.  HEART:  Regular rate and rhythm.  Normal sinus rhythm.  ABDOMEN:  Soft.  EXTREMITIES:  No cyanosis, clubbing, or edema.   PROBLEM:  1. Coronary artery disease with history of stents to the left anterior      descending artery and right coronary artery in 2000, patent stents      in 2007 and 2009.  2. Catheterization on February 01, 2008 with 70-75% lesion in the mid      right coronary artery.  3. Resting hypoxemia.      a.     Status post 6-minute walk with no evidence of hypoxemia.      b.     Liver function tests consistent with decreased diffusing       capacity of lung for carbon monoxide, otherwise  unremarkable.  4. History of normal left ventricular function.  5. Dyslipidemia.  6. Tobacco use.  7. History of anxiety and depression.  8. Chronic back pain.  9. Severe irritable bowel syndrome with diarrhea.  10.Hypertension.  11.Diastolic heart failure.   PLAN:  1. I do not think the patient needs further investigation of hypoxemia      this could not be documented on a 6-minute walk and I think this      done during this reading at cath lab previously.  2. The patient does have abnormal blood pressure test with decrease in      DLCO, but there is no obstructive airway disease.  I do not think      she necessarily needs a referral at this point in time.  3. Increase the patient's Imdur to 60 mg a day, still have a      occasional heavy angina as well as she continue to use      nitroglycerin, but if the nitroglycerin use goes up, we might      consider studying her although this occur study with a cardiac      catheterization, although this would be poor timing because of      recurrent problems with breast cancer and ongoing chemotherapy.  4. From IBS standpoint, the patient is markedly improved and she is       now sleeping well with the addition of the Seroquel that was given      by the [facility] in [place].  We will see      the patient back in a couple of months.     Ernestine Mcmurray, MD,FACC  Electronically Signed    GED/MedQ  DD: 10/01/2008  DT: 10/02/2008  Job #: [id number]   cc:   Jerene Bears, MD
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
## 2010-09-28 NOTE — Assessment & Plan Note (Signed)
[facility]                          [facility]ICE NOTE   [name] [name], [name] [name] [name] [name]                       MRN:          [id number]  DATE:01/04/2008                            DOB:          Oct 24, 1950    CARDIOLOGIST:  Ernestine Mcmurray, MD,FACC.   PRIMARY CARE PHYSICIAN:  Jerene Bears, MD   REASON FOR VISIT:  A 2-week followup.   HISTORY OF PRESENT ILLNESS:  Ms. Colden is a very pleasant 60 year old  female patient with a history of coronary artery disease status post  stenting to the RCA and LAD in 2000, who presents now for followup.  She  recently saw Dr. Ron Parker in Dr. Arlina Robes absence.  She had some edema and  volume overload.  He set up for an echocardiogram and discontinued her  hydrochlorothiazide and initiated Lasix 40 mg a day.  Her echocardiogram  returned with normal LV function.  She did have abnormal diastolic  filling consistent with impaired relaxation.  She had no significant  valvular abnormalities.  Her EF was 55-60%.  In the office today, she  notes that she continues to have edema.  This seems to be quite  troublesome for her.  She notes increased pain in her legs with walking  when her edema is particularly worse.  She does not feel that the Lasix  is creating that much diuresis.  Her weight is down from 177-176 since  she last seen in the office.  She does tell me that she has had episodes  of chest tightness over the last 6 months as well as increased dyspnea  with exertion.  She describes NYHA class II-III symptoms at times.  She  denies orthopnea or PND.  She does note some radiation up into her jaw  with her chest tightness.  She denies syncope, but she does have  dizziness from time to time.  She also has a questionable spinning  sensation.  She has taken nitroglycerin before with some relief.   CURRENT MEDICATIONS:  Nexium 40 mg b.i.d., Aspirin 81 mg daily, Cardizem  240 mg at bedtime, Vicodin 10/325 mg 4 times a day,  Neurontin 600 mg 3  times a day, Kadian 30 mg b.i.d., Coenzyme Q10 150 mg daily, Diovan 180  mg daily, Pravachol 40 mg daily, Fish oil b.i.d., Lasix 40 mg daily.   ALLERGIES:  She has multiple allergies as listed in the chart including  ZESTRIL, NORVASC, LOPRESSOR, CARDURA, SULFA and PENICILLIN.   SOCIAL HISTORY:  She denies any tobacco abuse.   FAMILY HISTORY:  Significant for CAD.  She has a brother who had a  myocardial infarction at age 60.  He is still alive.   REVIEW OF SYSTEMS:  Please see HPI.  She denies any fevers, chills,  cough, melena, hematochezia, hematuria, or dysuria.  She does have a  history of diverticulosis and irritable bowel syndrome.  She has a  history of episodic bright red blood per rectum.  She has not had  evidence of this since quite some time.  She denies melena.  Rest of the  review of  systems are negative.   PHYSICAL EXAMINATION:  GENERAL:  She is a well-nourished, well-developed  female, in no acute distress.  VITAL SIGNS:  Blood pressure 123/77, pulse 96, and weight is 177 pounds.  HEENT:  Normal.  NECK:  Without JVD.  LYMPHATICS:  No adenopathy.  ENDOCRINE:  Without thyromegaly.  CARDIAC:  Normal S1 and S2.  Regular rate and rhythm without murmur.  LUNGS:  Clear to auscultation bilaterally without wheezing, rhonchi, or  rales.  ABDOMEN:  Soft, nontender with normoactive bowel sounds, no  organomegaly.  EXTREMITIES:  With 1-2+ edema bilaterally.  SKIN:  Warm and dry.  NEUROLOGIC:  She is alert and oriented x3.  Cranial nerves II-XII  grossly intact.  VASCULAR:  I cannot appreciate any carotid artery bruits or femoral  artery bruits bilaterally.   ASSESSMENT AND PLAN:  97. A 60 year old female with a history of coronary artery disease      status post stenting to the right coronary artery and left anterior      descending in 2000 who now presents with complaints of shortness of      breath and chest tightness with radiation to her jaw over  the last      6 months.  She notes that some of her symptoms are similar to what      she has had in the past with her angina, but not as bad.  I      discussed the case today with Dr. Ron Parker and we have elected to      proceed with cardiac catheterization to further evaluate her      symptoms.  We will proceed with a right and left heart      catheterization to also assess her right-sided heart pressures.      She will continue on aspirin and p.r.n. nitroglycerin.  She has not      been placed on a beta-blocker secondary to a history of allergy to      Lopressor.  She will continue on a calcium channel blocker.  2. Volume overload with preserved left ventricular function by recent      echocardiogram - question chronic diastolic congestive heart      failure.  She has not responded very well to Lasix 40 mg a day.  We      will increase her Lasix dose to 60 mg a day, but we will wait to do      this until after her cardiac catheterization.  She will need      followup blood work after this change is made.  3. Dyslipidemia.  This is followed by Dr. Woody Seller.  She will continue on      Pravachol and her goal LDL is less than or equal to 70.  4. Degenerative disk disease.  This is stable and she will continue on      her pain medications as listed.  5. Hypertension, controlled.  No medication changes were made today.  6. Tobacco abuse.  She has been encouraged to quit smoking previously.   DISPOSITION:  The patient will be brought back in followup  postcatheterization for further recommendations.      Richardson Dopp, PA-C  Electronically Signed      Carlena Bjornstad, MD, [facility]  Electronically Signed   SW/MedQ  DD: 01/04/2008  DT: 01/05/2008  Job #: [id number]   cc:   Jerene Bears, MD

## 2010-09-28 NOTE — Assessment & Plan Note (Signed)
NAMETENESA, MASTENBROOK                 CHART#:  [id number]   DATE:  03/13/2007                       DOB:  [birth year]   PROBLEM LIST:  1. Irritable bowel syndrome with intermittent diarrhea and      constipation.  2. Diverticulosis.  3. Anxiety.  4. Allergies.  5. Cervical disk disease.  6. History of coronary artery disease.  7. Hematochezia secondary to hemorrhoids.   SUBJECTIVE:  Ms. Shovan is a 60 year old female who presents as a return  patient visit.  She was last seen in July 2008.  She has abdominal  cramps if she gets nervous or upset.  Her legal problems with her  brother have resolved.  Her son does not talk to her anymore.  Sometimes  she has accidents but does not feel it.  She also will not release  the stool and a few hours later sees just a dab on her underwear 2-3  times a week.  She uses her Bentyl sometimes.  Her Levsin works for her  cramping.  She sometimes takes fiber supplement and she has used MiraLax  as needed for constipation.  She has been going to see her counselor  Maurice Small which is helping manage her psychosocial stresses.   MEDICATIONS:  1. Levsin as needed.  2. Diovan.  3. Labetalol.  4. Nexium 40 mg daily.  5. Prempro.  6. Celexa 40 mg daily.  7. Aspirin.  8. Allegra.  9. Lomotil.  10.Xanax as needed.  11.Diclofenac as needed.  12.Hydrocodone.  13.Kadian.  14.Neurontin.  15.Soma.  16.Bentyl 3 times a day as needed.  17.Cardizem.  18.Pravachol.  19.Claritin.   OBJECTIVE:  Weight 167 pounds (up 17 pounds since July 2008 and 30  pounds since September 2007), height 5 foot 3 inches, BMI 29.6  (overweight), temperature 97.9, blood pressure 140/92, pulse 92.  GENERAL:  She is no apparent distress, alert and oriented x4.  LUNGS:  Clear to auscultation bilaterally.  CARDIOVASCULAR:  Regular rhythm, no murmur, normal S1 and S2.  ABDOMEN:  Bowel sounds are present, soft, nontender, nondistended.  No  rebound or guarding.   ASSESSMENT:   Ms. Asplin is a 60 year old female who has irritable bowel  syndrome.  In spite of the fact that I gave her her discharge  instructions in writing she usually loosely follows my recommendations.  Her symptoms of diarrhea seem to be improved since her last visit.  She  has been complaining of these symptoms intermittently at least since  October of 2007.  She has gained 30 pounds in the last year.  Thank you  for allowing me to see Ms. Wyer in consultation.  My recommendations  follow.   RECOMMENDATIONS:  1. She is to use Fiber-sure 1-2 times a day.  She should use MiraLax      once every 2-3 days.  2. She is to continue use of Bentyl 30 minutes before breakfast, lunch      and dinner as needed for diarrhea and/or Levsin as needed for      abdominal cramps or diarrhea.  3. She has a follow up appointment to see me in 4 months.  I      encouraged her to continue to see her mental health specialist and      to participate in weight loss program.  Caro Hight, M.D.  Electronically Signed     SM/MEDQ  D:  03/13/2007  T:  03/14/2007  Job:  [id number]   cc:   Jerene Bears

## 2010-09-28 NOTE — Assessment & Plan Note (Signed)
[name]                          [name] [name]   NAME:[name], [name] SKINNER                       MRN:          [id number]  DATE:09/25/2006                            DOB:          04-03-1951    HISTORY OF PRESENT ILLNESS:  [name] patient is a 60 year old female with a  known history of nonobstructive coronary artery disease in April 2006,  at that time her last catheterization was performed.  She does have a  history however of stent placement to [name] LAD and ostial right coronary  artery in 2000.   [name] patient was admitted this weekend at [name] with  recurrent substernal chest pain.  She described a particularly  concerning episode of approximately 10 to 15 minutes of severe  substernal chest pain which was associated with shortness of breath.  [name] patient also stated that [name] pain was so severe that she couldn't  speak.  Her pain felt similar to her prior history that she experienced  in 2000.  She had also a similar episode a couple of weeks earlier when  she at [name].  [name] most recent episode took approximately  six nitroglycerin sprays before it resolved.  [name] patient did call 911  and she was brought to [name].  She ruled out for myocardial  infarction with negative enzymes and a negative EKG.  She was seen by  our cardiology fellow on Sunday who felt [name] patient's symptoms were  rather concerning but stable at [name] time of evaluation.  [name] patient was  discharged on medical therapy with a followup office visit to arrange an  outpatient cardiac catheterization.   Of [name], also, [name] patient is under a significant amount of stress.  Her  stepfather, [name] [name] used to be our patient.  He was murdered  last year with a .45 caliber at his house.  [name] patient states that her  [name] has been accused of [name] murder and has been detained over [name]  last several months.  This has caused severe stress.   She also several  years ago went through a divorce.   Please refer for further details regarding [name] patient's recent  presentation to our cardiology fellow to [name] [name] : [name] [name] from Sep 23, 2006.   MEDICATIONS:  1. Diovan 80 mg p.o. daily.  2. Nexium 40 mg p.o. daily.  3. Aspirin 81 mg p.o. daily.  4. Cardizem 240 mg p.o. daily.  5. Pravachol 20 mg p.o. daily.  6. Vicodin 10/660 q.i.d.  7. Neurontin 600 mg p.o. t.i.d.  8. Kadian 50 mg p.o. b.i.d.  9. Coenzyme Q10.  10.Vitamin E.  11.Calcium.   PHYSICAL EXAMINATION:  VITAL SIGNS:  Blood pressure is 150/86, heart is  98 beats per minute.  GENERAL:  Well-nourished white female in no apparent distress.  HEENT:  Pupils eyes equal and reactive, conjunctivae clear.  NECK:  Supple.  Normal carotid upstroke.  No carotid bruits.  LUNGS:  Clear breath sounds bilaterally.  HEART:  Regular rate and rhythm.  Normal S1 and S2.  No murmurs, rubs,  or  gallops.  ABDOMEN:  Soft, nontender.  No rebound or guarding.  Good bowel sounds.  EXTREMITY EXAM:  No cyanosis, clubbing, or edema.   PROBLEM LIST:  1. Coronary artery disease.      a.     Recurrent substernal chest pain ruled out for myocardial       infarction Sep 23, 2006.      b.     Nonobstructive coronary artery disease with widely patent       stent April 2006.      c.     Status post stent to [name] proximal left anterior descending       and ostial right coronary artery 2000.      d.     Preserved left ventricular function.  2. Dyslipidemia (HISTORY OF INTOLERANCE TO ZOCOR, LIPITOR, AND FISH      OIL - tolerating Pravachol).  3. History of tobacco use (stopped smoking several years ago).  4. Hypertension, uncontrolled.  5. History of depression.  6. Anxiety.   PLAN:  1. [name] patient's symptoms are very concerning for recurrent angina and      that [name] patient reportedly took six nitroglycerin sprays for her      symptoms to resolve.  She however did rule out for myocardial       infarction and her pain did not last longer than 10 to 15 minutes.  2. [name] patient states that she is under significant amount of stress      and anxiety for [name] reasons as outlined above.  Clearly if further      evaluation is indicated and [name] patient is actually preferring to      proceed with a cardiac catheterization rather than stress testing      at this point I feel this is appropriate decision on her part.  3. [name] patient will be scheduled for a JV catheterization later this      week to reassess her coronary anatomy.  [name] patient also is given      prescription for hydrochlorothiazide in [name] interim to achieve      better blood pressure control.  She was also told to present back      to [name] emergency room if she has recurrent symptoms that require      nitroglycerin.     [name] Mcmurray, MD,FACC  Electronically Signed    GED/MedQ  DD: 09/25/2006  DT: 09/25/2006  Job #: [id number]   cc:   [name] [name]

## 2010-09-28 NOTE — Assessment & Plan Note (Signed)
[place] OFFICE NOTE   NAME:[name] [name], [name] [name]                       MRN:          [id number]  DATE:02/13/2007                            DOB:          05-Dec-1950    HISTORY OF PRESENT ILLNESS:  [name] [name] is a 60 year old, white female  who presents to our office today complaining of lower extremity  swelling.  She states that she has had problems with lower extremity  swelling for the last year, left side greater than right, however, she  has not had any injuries to the left lower extremity.  She states that  she was prescribed hydrochlorothiazide earlier this year which helped  her blood pressure as well as her swelling.  However, last Wednesday  after being particularly on her feet all day, she noticed increased  swelling.  It did not resolve overnight.  On Thursday, she took a 20 mg  Lasix from her prescription and states that the swelling resolved by  Friday.  She denies any associated orthopnea, shortness of breath or  PND.  She is not specific in regards to weight gain as she does not  weigh on a daily basis.  She states that her blood pressure is fairly  well-controlled and has ranged between 0000000 systolic and [id number]  diastolic.  She states that when her blood pressure is in the 90s, she  holds her Cardizem.  She is not clear as to how often she does this.  She also states that on Thursday, she had epidural injection for her  back which consisted of steroids.  Over the next several days, due to  back discomfort and neck stiffness, she stayed off her feet and her  swelling resolved by Friday.  She states her activities are limited by  her back issues, but she specifically denies any dyspnea on exertion or  exertional chest discomfort that prevents her activities.  On further  review of chart, it was noted that the patient was hospitalized in May  2008, at [facility] with chest discomfort and was recommended  for cardiac  catheterization, however, this was rescheduled on two occasions because  of a irritable bowel syndrome flare-up and she has not had the heart  catheterization.  Since her hospitalization in May, she states that she  did have one episode of chest discomfort surround specifics that the  patient cannot recall, however, she did state this was approximately 1  month ago and she was not sure if it was related to her heart or  indigestion.  She took Nexium without relief and in over an hour, took  three sublingual nitroglycerin which resolved the discomfort.  She did  not have any associated symptoms with this.  She did not contact her  office or reschedule the heart catheterization after the second  cancellation.   The patient also complains of increased thirst, i.e. polyuria,  polydipsia.   ALLERGIES:  ZESTRIL, NORVASC, LOPRESSOR AND CARDURA for which she states  she develops a rash.  She is also allergic to [place].   MEDICATIONS:  The patient  states her medications have not changed since  May 2008.  She did not bring them to the office.  According to the list,  these include the following:  1. Diovan 80 mg daily.  2. Nexium 40 mg daily.  3. Aspirin 81 mg daily.  4. Cardizem CD 240 daily.  5. Pravachol 20 daily.  6. Vicodin 10/60 four times a day.  7. Neurontin 600 mg t.i.d.  8. Kadian 15 mg p.r.n. b.i.d.  9. Co-Q10 150 daily.  10.Vitamin E.  11.Calcium three times weekly.  12.Hydrochlorothiazide 25 mg daily.  13.Nitroglycerin 0.4 spray.   PAST MEDICAL HISTORY:  1. Hypertension.  2. Coronary artery disease with stenting to the proximal LAD and      ostial RCA in 2000, with preserved LV function.  Last      catheterization on April 6, showed patent stents.  3. Hospitalization for chest discomfort May 2008, at [facility].  The patient has cancelled cardiac catheterization x2.  4. History of dyslipidemia with intolerance Zocor, Lipitor,  Pravachol      and fish oil.  5. Tobacco use with cessation several years ago.  6. Depression/anxiety.  7. Chronic back discomfort.   SOCIAL HISTORY/FAMILY HISTORY:  Remain unchanged, however, her stressors  indicate in the note of Sep 25, 2006, in regards to her father's murder,  have been resolved after a prolonged trial.   PHYSICAL EXAMINATION:  GENERAL:  Well-nourished, well-developed,  pleasant, white female in no apparent distress.  VITAL SIGNS:  Blood pressure 134/87, heart rate 84, weight 155.6.  HEENT:  Unremarkable.  NECK:  Supple without adenopathy, thyromegaly, JVD or carotid bruits.  CHEST:  Symmetrical excursions.  LUNGS:  Clear to auscultation.  HEART:  PMI not displaced with regular rate and rhythm.  Did not  appreciate any murmurs, rubs, clicks or gallops.  All pulses are  symmetrically intact.  Did not appreciate any abdominal or femoral  bruits.  ABDOMEN:  Slightly round, bowel sounds present without organomegaly,  masses or tenderness.  EXTREMITIES:  No clubbing, cyanosis or edema.  MUSCULOSKELETAL:  Grossly intact.  NEUROLOGIC:  Grossly intact.   EKG in the office shows normal sinus rhythm, normal axis, baseline  artifact, nonspecific ST and T-wave changes.   IMPRESSION:  1. Transient lower extremity edema.  I do not see any active evidence      of congestive heart failure by history or exam.  2. Hypertension.  History as noted above.   DISPOSITION:  On review, I reassured [name] [name] that I did not find  active evidence of findings consistent with heart failure.  I asked her  to begin wearing her support stockings that she had in the past  particularly with increased activity.  I will give her a prescription  for Lasix 10 mg p.r.n. with the understanding that if she has to use  this for unresolved overnight edema every day or every other day, that  she needs to call us.  I have asked her to begin a blood pressure diary  and also include daily weights with  this.  When she returns to the  office in 1 month, bring her blood pressure and weight diary.  I have  asked her specifically to avoid salt.  We will check her blood work this  week in regards to fasting lipids since they have not been checked since  November 2007, a CMET and a hemoglobin A1c.  I will also review with Dr.  DeGent this afternoon if the patient should be referred for cardiac  catheterization.  At this point, I did not feel that she needs cardiac  catheterization since she has only had one episode of atypical chest  discomfort since May.  However, we will call her at home this afternoon  after I have discussed this with Dr. Dannielle Burn.      I discussed with Dr Dannielle Burn the above,  we will hold off on rescheduling  the heart cath at this time.  I reinforced with her proper NTG use and  to notify us/come to the ER with further chest discomfort.      Sharyl Nimrod, PA-C  Electronically Signed      Ernestine Mcmurray, MD,FACC  Electronically Signed   EW/MedQ  DD: 02/13/2007  DT: 02/13/2007  Job #: [id number]   cc:   Jerene Bears

## 2010-09-28 NOTE — Cardiovascular Report (Signed)
NAMESAIJE, SOCARRAS                ACCOUNT NO.:  [id number]   MEDICAL RECORD NO.:  [id number]          PATIENT TYPE:  OIB   LOCATION:  1963                         FACILITY:  [facility]   PHYSICIAN:  Loretha Brasil. Lia Foyer, MD, FACCDATE OF BIRTH:  16-Apr-1951   DATE OF PROCEDURE:  DATE OF DISCHARGE:  02/01/2008                            CARDIAC CATHETERIZATION   INDICATIONS:  Ms. [name] [name] is a very delightful 60 year old who is known to  me.  She has had prior stenting of the ostium of the right coronary  artery, as well as the proximal left anterior descending artery.  This  was done in 2000.  She has done well in the interim without any  recurrent ischemia.  Recently, she has had some chest discomfort.  She  has also had some shortness of breath and lower extremity edema which  has been difficult to resolve.  As a result of all of these things, she  also has had a venous Doppler of the lower extremities, which has not  demonstrated a DVT done in Dr. Woody Seller' office in [place].  The current study  was done to reassess her anatomy.   PROCEDURES:  1. Right and left heart catheterization  2. Selective coronary arteriography.  3. Selective left ventriculography.   DESCRIPTION OF THE PROCEDURE:  Following informed consent, the patient  was brought to the catheterization laboratory and prepped and draped in  the usual fashion.  Through an anterior puncture, the femoral vein was  entered, 7-French sheath was placed, a thermodilution Swan-Ganz catheter  was then passed into the superior vena cava where saturation was  obtained.  Following this, serial right heart pressures were measured.  Following this, pulmonary artery saturation was also obtained.  Thermodilution cardiac outputs were performed.   Following this, the femoral artery was entered using a 4-French sheath.  We measured central aortic and left ventricular pressures, saturation  was obtained, simultaneous wedge LV were performed.  Following  this, we  pulled the right heart catheter into the RV where we documented that  there was no LV/RV equilibration.  The right heart catheter was then  removed.  Following this, a pressure pullback was performed and the  pigtail catheter was removed.  We then performed coronary arteriography.  She tolerated all this well, and there were no major complications.  She  was taken to the holding area in satisfactory clinical condition.   HEMODYNAMIC DATA:  1. Right atrial pressure 11/6/6.  2. RV 35/12.  3. Pulmonary artery 34/17, mean 25.  4. Pulmonary capillary wedge 16.  5. LV 172/20.  6. Aortic 168/89, mean 122.  7. Thermodilution cardiac output 4.2 liters per minute.  8. Thermodilution cardiac index 2.3 liters per minute per meter      squared.  9. Aortic saturation 88%.  10.Pulmonary artery saturation 64%.  11.Superior vena cava saturation 58%.   ANGIOGRAPHIC DATA:  1. Ventriculography was done in the RAO projection.  Overall systolic      function appeared preserved and no definite wall motion      abnormalities.  There also did not appear to  be significant mitral      regurgitation.  2. The right coronary artery demonstrates a previously placed stent at      the ostium.  This appears to be patent, and there is no damping      with engagement.  Just distal to the right coronary ostium and      stent, there is a second area of narrowing of about 70-75%.  The      vessel beyond this is slightly calcified and with mild luminal      irregularities throughout involve a posterior descending and      posterolateral system.  3. The left main is free of critical disease.  4. The LAD coursed to the apex.  The stent that has been previously      placed in the apex appears to be without critical narrowing.  There      is some mild luminal irregularity throughout the LAD distally but      without critical obstruction.  Mid LAD stent is less than 30%.  5. The circumflex provides a ramus  intermedius.  The ramus intermedius      is a fairly large-caliber vessel that has segmental 50-60% plaquing      throughout the whole proximal vessel, but does not impinge on the      distal vessel much.  It is a long area of segmental plaque.  The AV      circumflex provides a marginal that also has minimal luminal      irregularities but without critical obstructions.   CONCLUSIONS:  1. Well-preserved left ventricular function.  2. Mild pulmonary hypertension, probably due to elevated left      ventricular end-diastolic pressure, question diastolic abnormality      versus and/or sleep apnea.  3. Continued patency of the LAD, RCA stents.  4. New lesion of the right coronary artery.  5. Resting hypoxemia.   PLAN:  1. We will arrange followup early in the [facility] on Monday.  2. She will need to have this evaluated further.  A CT scan and/or V/Q      scan may be warranted to exclude the etiologies of resting      hypoxemia also.  The patient will likely need further evaluation of      this.  3. Consideration of an alternative drug to Diltiazem would be      recommended with lower extremity edema.  4. Dr. Olevia Perches and I have reviewed the films, and we would lean towards      percutaneous intervention of the right coronary artery, but after      the issues with regard to resting hypoxemia are resolved.   ADDENDUM   Arterial blood gas reveals a pH of 7.36, pCO2 of 48, pO2 of 66  suggesting that some of this is due to hypoventilation.      Loretha Brasil. Lia Foyer, MD, [facility]  Electronically Signed     TDS/MEDQ  D:  02/01/2008  T:  02/02/2008  Job:  [id number]   cc:   Ernestine Mcmurray, MD,FACC  Richardson Dopp, PA-C

## 2010-09-28 NOTE — Assessment & Plan Note (Signed)
[facility] OFFICE NOTE   [name] [name], [name] [name] [name] [name]                       MRN:          [id number]  DATE:12/20/2007                            DOB:          Nov 26, 1950    [name] [name] is followed by Dr. Dannielle Burn.  She is 60 years of age.  She does  have coronary disease that has been stable.  She has a stent to her LAD  and ostial right in 2000.  The stents were patent by cath in 2007.  There is a history of normal LV function.  The patient has had some  hospitalizations recently.  In April 2009, she had some chest  discomfort.  She was admitted and stabilized with no MI and she was  discharged.  Plans were made for her to have cardiology follow-up.  However, we have not seen her in the office.  She mentions that she has  had some problems with irritable bowel.  Also she was seen in the  emergency room in late July 2009.  She had some nausea and vomiting and  this improved and she was sent home.  At that time, her chest x-ray  showed no marked abnormalities.  This past weekend, the patient had a  problem with substantial volume overload.  She had shortness of breath  and some chest discomfort with this.  Alternately she took Lasix at 60  mg daily and she diuresed nicely.  She feels better now.  Her edema is  gone.  Her breathing is better.   ALLERGIES:  ZESTRIL, NORVASC, LOPRESSOR, CARDURA, SULFA and PENICILLIN.   MEDICATIONS:  1. Nexium 40 b.i.d.  2. Aspirin 81.  3. Cardizem 240.  4. Vicodin 10/325 q.i.d.  5. Neurontin 60 t.i.d.  6. Kadian 30 mg b.i.d.  7. Hydrochlorothiazide (to be put on hold).  8. Diovan 80.  9. Pravachol 40.  10.Fish oil.   OTHER MEDICAL PROBLEMS:  See the list below.   REVIEW OF SYSTEMS:  The patient has neck and back discomfort.  She has  been treated by Dr. Carloyn Manner over time and has had some injections also.  She  will be seen by Dr. Carloyn Manner later today and we will try to get a copy of  this  note to him.  Otherwise, review of systems is negative.   PHYSICAL EXAMINATION:  VITAL SIGNS:  Blood pressure is 142/83 with a  pulse of 82.  Weight is 176 pounds.  The patient's room air O2 sat is  93%.  GENERAL APPEARANCE:  The patient is oriented to person, time and place.  Affect is normal.  She is here with her husband today.  HEENT:  No xanthelasma.  She has normal extraocular motion.  NECK:  There are no carotid bruits.  There is no jugular venous  distention.  LUNGS:  Clear.  Respiratory effort is not labored.  CARDIAC:  S1 with an S2.  There are no clicks or significant murmurs.  ABDOMEN:  Obese.  She has no significant peripheral edema today.   EKG reveals sinus rhythm.   PROBLEMS:  1. Coronary disease with a history of stents to the left anterior      descending and ostial right in 2000 with patent stents in 2007.  2. History of normal left ventricular function.  The patient needs a      follow-up 2-D echo to reassess left ventricular function.  3. Dyslipidemia.  4. Tobacco use and of course she is encouraged to stop smoking      completely.  5. History of depression.  6. Chronic back pain.  7. Hypertension, treated.  8. Recent volume overload.  With this, she was short of breath and had      some chest discomfort.  At this point, we will proceed with 2-D      echo to reassess left ventricular function and I have changed her      hydrochlorothiazide to Lasix 40 mg daily.  I will see her back in      the near future.  We will obtain a BMET before she comes and then      we will see her for further assessment.  She will also have a 2-D      echo as mentioned.  We will then decide if she needs more complete      workup or not.  9. Irritable bowel.  10.Some question of high and low blood pressures, but I do not believe      that this needs further workup at this time.     Carlena Bjornstad, MD, [facility]  Electronically Signed    JDK/MedQ  DD: 12/20/2007  DT: 12/20/2007   Job #: [id number]   cc:   Jerene Bears, MD

## 2010-09-28 NOTE — Assessment & Plan Note (Signed)
[facility]                          [facility] OFFICE NOTE   NAME:[name], [name] LISBON                       MRN:          [id number]  DATE:05/15/2007                            DOB:          06/18/1950    HISTORY OF PRESENT ILLNESS:  The patient is a 60 year old female with a  history of coronary artery disease.  The patient has been doing quite  well.  She reports no shortness of breath, orthopnea, or PND.  Unfortunately, she has gained quite a bit weight, approximately 20  pounds, which has caused her to have decreased exercise tolerance.  She  also has significant low back pain, and is contemplating lower back  surgery with Dr. Carloyn Manner in [place].  The patient denies any orthopnea,  PND, palpitations, or syncope.   MEDICATIONS:  1. Diovan 180 mg p.o. daily.  2. Pravachol 40 mg p.o. daily.  3. Fish oil OTC.  4. Calcium 5000 mg p.o. b.i.d.  5. Nexium 40 mg p.o. daily.  6. Aspirin 81 mg p.o. daily.  7. Cardizem 240 mg p.o. daily.  8. Vicodin q.i.d. p.r.n.  9. Coenzyme Q10.  10.Vitamin E.  11.Hydrochlorothiazide 25 mg p.o. daily.   PHYSICAL EXAMINATION:  VITAL SIGNS:  Blood pressure 129/79, heart rate  is 95, weight is 168 pounds.  NECK EXAM:  Normal carotid upstrokes and no carotid bruits.  LUNGS:  Clear breath sounds throughout.  HEART:  Regular rate and rhythm.  Normal S1, S2, no murmurs, rubs, or  gallops.  ABDOMEN:  Soft, nontender, no rebound or guarding.  Good bowel sounds.  EXTREMITY EXAM:  No cyanosis, clubbing, or edema.  NEURO:  The patient is alert and oriented and grossly nonfocal.   A 12-lead electrocardiogram, normal sinus rhythm, no acute ischemic  changes.   PROBLEM LIST:  1. Coronary artery disease.      a.     Stent to the left anterior descending and ostial right       coronary artery, 2000.      b.     Catheterization in 2007 with patent stents.  2. Normal left ventricular function.  3. Dyslipidemia, on  lipid-lowering therapy.  4. Tobacco use.  5. Depression.  6. Chronic back discomfort scheduled for back surgery.  7. Hypertension, stable.   PLAN:  1. The patient does not appear to have unstable angina, but she is      planning to undergo back surgery, and we will schedule her for a      stress test.  2. The patient does report reflux symptoms and I have increased her      Nexium to 40 mg p.o. b.i.d.  3. We also gave patient refills on Diovan.  4. The patient will followup with [place] in 6 months and can be cleared      for surgery, her stress test is within normal limits.     Ernestine Mcmurray, MD,FACC  Electronically Signed    GED/MedQ  DD: 05/15/2007  DT: 05/15/2007  Job #: [id number]   cc:   Jerene Bears

## 2010-10-01 NOTE — Discharge Summary (Signed)
[name] [name], [name] [name]                ACCOUNT NO.:  [id number]   MEDICAL RECORD NO.:  [id number]          PATIENT TYPE:  INP   LOCATION:  4704                         FACILITY:  [facility]   PHYSICIAN:  Gene Serpe, P.A. [facility]   DATE OF BIRTH:  1950/10/22   DATE OF ADMISSION:  09/02/2004  DATE OF DISCHARGE:                                 DISCHARGE SUMMARY   PROCEDURE:  Cardiac catheterization September 03, 2004.   REASON FOR ADMISSION:  Please refer to the admission note.   LABORATORY DATA:  Lipid profile:  Total cholesterol 241, triglycerides 301,  HDL 42, LDL 139 (cholesterol/HDL ratio 5.7).  Hepatic profile:  Normal,  decreased albumin 3.0.   HOSPITAL COURSE:  Following transfer from [facility], where the  patient presented with recurrent chest pain and where all serial cardiac  markers and electrocardiograms were normal, the patient was maintained on a  medication regimen including aspirin, beta blocker and Lovenox.   Although her chest pain was felt to be atypical, the patient does have a  history of coronary artery disease, status post prior percutaneous  intervention.  Our recommendation was to proceed with diagnostic coronary  angiography.   Cardiac catheterization was performed on the morning of discharge by Dr.  Alba Cory (see report for full details), revealing nonobstructive coronary  artery disease with widely patent stents of the proximal LAD and ostial RCA.  Left ventricular function was normal (EF of 60%), with normal wall motion.   The patient was cleared for discharge later the same evening.  Aggressive  secondary prevention was advocated.   MEDICATIONS AT DISCHARGE:  Cardizem CD 180 mg q.d., Diovan 80 mg q.d.,  Toprol XL 50 mg q.d., Nexium 40 mg q.d., Celexa 40 mg q.d., Vicodin as  directed, Nitrostat 0.4 mg as directed.   INSTRUCTIONS:  The patient is instructed to arrange follow-up with Dr.  G_________ in approximately two weeks at our [facility] office.   DISCHARGE  DIAGNOSES:  1.  Noncardiac chest pain.  1A. Normal serial cardiac markers.  1B. Nonobstructive coronary artery disease/widely patent proximal LAD and  ostial RCA stent sites - cardiac catheterization September 03, 2004.  1C. Normal left ventricular function.  1D. Status post stent LAD and RCA 2000.  1.  Mixed dyslipidemia.  2A. Statin intolerant.  1.  History of hypertension.  2.  History of depression.      GS/MEDQ  D:  09/03/2004  T:  09/05/2004  Job:  [id number]   cc:   Allene Dillon, Dr.  Ledell Noss, [place]   Minus Breeding, M.D.

## 2010-10-01 NOTE — Op Note (Signed)
NAME:  [name] [name], [name] [name]                          ACCOUNT NO.:  [id number]   MEDICAL RECORD NO.:  [id number]                   PATIENT TYPE:  INP   LOCATION:  2899                                 FACILITY:  [facility]   PHYSICIAN:  Elizabeth Sauer, M.D.                   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   OPERATION PERFORMED:  Abandoned post cervical fusion.   SURGEON:  Elizabeth Sauer, M.D.   ASSISTANT:   ANESTHESIA:  General endotracheal.   PREP:  None.   COMPLICATIONS:  Slipped in the [place] head holder.   INDICATIONS FOR PROCEDURE:  The patient is a 60 year old lady whose history  and physical is recounted in the chart.  She is admitted for posterior  cervical augmentation with fusion.   DESCRIPTION OF PROCEDURE:  The patient was taken to the operating room,  smoothly anesthetized, intubated, and placed in the [place] head holder.  It was tightened in the standard tightness and when she was turned prone,  pin slipped in the posterior part of the left frontal lobe causing a scalp  laceration.  The patient's head was being held by me when this occurred.  There was no deformity of the neck.  The patient was rapidly returned to the  table and the laceration compressed and stapled.  There was a subgaleal  hematoma associated with it.  Because of concerns with the patient's  cervical spine, she was allowed to awaken.  She was moving all fours.  She  had a small subgaleal hematoma.  Because of this, it was elected not to  place her back into the head holder and she was allowed to awaken from  anesthesia where she was moving all fours.  She was returned to the recovery  room.                                               Elizabeth Sauer, M.D.    MWR/MEDQ  D:  11/25/2003  T:  11/25/2003  Job:  [id number]

## 2010-10-01 NOTE — Discharge Summary (Signed)
[facility]  Patient:    [name] [name], [name] [name]                   MRN: [id number] Adm. Date:  [id number] Disc. Date: 03/07/00 Attending:  Wadie Lessen Dictator:   Arn Medal, P.A.C. CC:         Despina Hick, M.D.  Terald Sleeper, M.D., [facility]   Discharge Summary  DISCHARGE DIAGNOSES: 1. Coronary artery disease. 2. Hypertension. 3. Hyperlipidemia. 4. Seasonal allergies. 5. Status post neck and back surgery with residual pain. 6. Anxiety.  HOSPITAL COURSE:  The patient is a 60 year old female who presented to the ED complaining of increasing chest pain over one month.  She also reported increased stress.  She reports that the chest pain and shortness of breath were typical from previous episodes.  Her cardiac risk factors included positive for hypertension, CAD, and lymphedema.  She was admitted to the telemetry unit by Dr. Lia Foyer and started on IV nitroglycerin.  Over the next day or so, the patient continued to have episodes of chest pain and shortness of breath.  On October 22, Dr. Lia Foyer took the patient to the cath lab. Results showed no significant restenosis, normal left ventricular function, no aortic dissection, and ejection fraction of 67%.  During the stay, the patient also complained of dysuria and frequency.  Urinary culture revealed Escherichia coli 10 to the fifth count, sensitive to ciprofloxacin.  She was started on this regimen along with Pyridium 200 mg t.i.d.  The patients blood pressure also was slightly elevated during the stay and on discharge, her vital signs are heart rate of 70, blood pressure 136/60.  She also denies any chest pain or shortness of breath at the time of discharge.  DISCHARGE MEDICATIONS: 1. Cardizem CD 120 mg q.d. 2. Cozaar 25 mg twice daily. 3. Coated aspirin 325 mg q.d. 4. Lopressor 25 mg b.i.d. 5. Lipitor 20 mg q.h.s. 6. Pamelor 50 mg q.h.s. 7. Cipro 500 mg one tablet at bedtime, one  count only. 8. Claritin 10 mg once daily. 9. Fioricet one tablet q.6h. if needed for pain.  LABORATORY DATA:  The patient ruled out negative for MI by enzymes.  White count 6.4.  H&H 12.7 and 37.0 with platelets of 223.  FOLLOW-UP:  The patient is to return to work on March 13, 2000.  There is to be no driving, sexual activity, tub baths, or heavy lifting for 24 hours.  She was instructed to follow a low fat, low cholesterol diet.  She was instructed to watch for swelling, bleeding, pain, or discharge from the cath site.  She was also instructed to continue efforts to stop smoking.  She was to follow up with Dr. Dannielle Burn in the [facility] office on November 16, at 10:15 in the morning. The patient is discharged home in stable condition. DD:  03/07/00 TD:  03/07/00 Job: [id number]

## 2010-10-01 NOTE — Op Note (Signed)
[name] [name], [name] [name]                ACCOUNT NO.:  [id number]   MEDICAL RECORD NO.:  [id number]          PATIENT TYPE:  AMB   LOCATION:  DAY                           FACILITY:  [facility]   PHYSICIAN:  Caro Hight, M.D.      DATE OF BIRTH:  08/05/1950   DATE OF PROCEDURE:  02/27/2006  DATE OF DISCHARGE:                                 OPERATIVE REPORT   REFERRING PHYSICIAN:  Dr. Woody Seller.   PROCEDURE:  Colonoscopy with cold-forceps biopsy.   INDICATION FOR EXAMINATION:  [name] [name] is a 60 year old female with  intermittent diarrhea and rectal bleeding, now on Levbid twice a day and  Fibersure twice daily.   FINDINGS:  Diverticulosis.  Random biopsies obtained to evaluate for  lymphocytic or microscopic colitis.  Otherwise, no polyps, masses,  inflammatory changes or vascular ectasia seen.  No internal hemorrhoids, but  external hemorrhoids seen and are the likely source of rectal bleeding.  Otherwise, no rectal polyps seen.   RECOMMENDATIONS:  1. Decrease Levbid to once daily.  Continuous Fibersure twice daily and      high-fiber diet.  2. Screening colonoscopy in 10 years.  3. Follow up with Dr. Stann Mainland at next regularly scheduled visit.  Will      discuss the results of biopsies at next regularly scheduled visit.   MEDICATIONS:  1. Demerol 100 mg IV.  2. Versed 6 mg IV.   PROCEDURE TECHNIQUE:  Physical exam was performed, and informed consent was  obtained from the patient after explaining the benefits, risks and  alternatives to the procedure.  The patient was connected to the monitor and  placed in left lateral position.  Continuous suction provided by nasal  cannula, and IV medicine administered through an indwelling cannula.  After  administration of sedation and rectal exam, the patient's rectum was  intubated, and the  scope was passed under direct visualization to the cecum.  The scope was  subsequently removed slowly by carefully examining the color, texture,  anatomy and  integrity of the mucosa on the way out.  The patient was  recovered in endoscopy suite and discharged home in satisfactory condition.      Caro Hight, M.D.  Electronically Signed     SM/MEDQ  D:  02/28/2006  T:  03/01/2006  Job:  [id number]   cc:   Jerene Bears  Fax: [phone]

## 2010-10-01 NOTE — Op Note (Signed)
NAME:  [name] [name], [name] [name]                          ACCOUNT NO.:  [id number]   MEDICAL RECORD NO.:  [id number]                   PATIENT TYPE:  OIB   LOCATION:  NA                                   FACILITY:  [facility]   PHYSICIAN:  Elizabeth Sauer, M.D.                   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   OPERATION PERFORMED:  C4-5 anterior cervical decompression and fusion.   SURGEON:  Elizabeth Sauer, M.D.   ANESTHESIA:  General endotracheal.   PREP:  Sterile Betadine prep and scrub with alcohol wipe.   COMPLICATIONS:  None.   ASSISTANT:  1. Earleen Newport, M.D.  [place].   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   DESCRIPTION OF PROCEDURE:  The patient was taken to the operating room,  smoothly anesthetized, intubated, and placed supine on the operating table  in halter head traction, neck extended.  Following shave, prep and drape in  the usual sterile fashion, skin was incised from the midline to the medial  border of the sternocleidomastoid muscle, slightly above the level or the  carotid tubercle.  The platysma was identified, elevated, divided and  undermined.  The sternocleidomastoid muscle was identified.  Medial  dissection revealed the carotid artery retracted laterally to the left.  The  lateral margin of the esophagus was identified and carefully dissected free  from the scar around the plate.  This allowed lateral retraction of the  esophagus to the right.  Intraoperative x-ray was not obtained at this time  because the plate was noted to be below C4-5 and so diskectomy was carried  out at that adjacent level.  This was done under gross observation.  We  then  brought in the operating microscope and microdissection technique was used  to remove the remainder of the disk, remove the osteophyte, divide the  posterior longitudinal ligament and create foraminotomies over the nerve  root bilaterally.  Having completed this, the nerve was free to palpation  bilaterally.  The wound was irrigated and hemostasis assured.  Intraoperative x-ray demonstrated  the right level and a 7 mm bone graft was  fashioned from patellar allograft and tapped into place.  The platysma was  reapproximated with 3-0 Vicryl in interrupted fashion.  Subcutaneous tissue  was reapproximated with 3-0 Vicryl interrupted fashion.  The skin was closed with 4-0 Vicryl in running subcuticular fashion.  Benzoin and Steri-Strips were placed and made occlusive with Telfa and Op-  Site.  The patient was then placed the Aspen collar and returned to the  recovery room in good condition.  Elizabeth Sauer, M.D.    MWR/MEDQ  D:  01/21/2003  T:  01/21/2003  Job:  [id number]

## 2010-10-01 NOTE — Cardiovascular Report (Signed)
NAME:  [name] [name], [name] [name]                          ACCOUNT NO.:  [id number]   MEDICAL RECORD NO.:  [id number]                   PATIENT TYPE:  INP   LOCATION:  3742                                 FACILITY:  [facility]   PHYSICIAN:  Minus Breeding, M.D.                DATE OF BIRTH:  May 24, 1950   DATE OF PROCEDURE:  09/30/2002  DATE OF DISCHARGE:                              CARDIAC CATHETERIZATION   PRIMARY CARE PHYSICIAN:  Dr. Jerene Bears.   PROCEDURE:  Left heart catheterization/coronary arteriography.   INDICATION:  Evaluate patient with chest pain and previous coronary stenting  in her proximal left anterior descending and right coronary artery ostium.   PROCEDURAL NOTE:  Left heart catheterization was performed via the right  femoral artery.  The artery was cannulated using anterior wall puncture.  A  #6-French arterial sheath was inserted via the modified Seldinger technique.  Preformed Judkins and a No-Torque catheter were utilized.  The patient  tolerated the procedure well and left the lab in stable condition.  Of note,  she was hypertensive throughout the case, requiring a total of 1.25 mg of IV  enalapril.   RESULTS:   HEMODYNAMICS:  LV 199/19, AO 208/103.   CORONARIES:  The left main was normal.  The LAD had a proximal stent which  was widely patent.  The remainder of the vessel was free of disease.  The  circumflex in the A-V groove was normal.  Vessel essentially terminated as a  large mid obtuse marginal, with a very small vessel continuing on in the A-V  groove.  This was normal.  The ramus intermedius was a large branching  vessel.  There was long proximal 30% stenosis.  Right coronary artery was a  dominant vessel and was large.  There was an ostial stent.  There was ostial  40% stenosis as well as distal 40% stenosis within the stented area and  luminal irregularities throughout.  This did not appear to be changed form  the films in 2001.  The remainder of the  vessel was free of disease.   LEFT VENTRICULOGRAM:  The left ventriculogram was obtained in the RAO  projection.  The EF was 65% with normal wall motion.   CONCLUSION:  1. Two-vessel coronary artery disease with patent stents.  2. Well-preserved left ventricular function.    PLAN:  No further cardiovascular testing is suggested.  The appearance of  the stenosis in the RCA stent is unchanged.  She has had a negative  perfusion study in the last six months for similar symptoms.  She will  continue with aggressive secondary risk reduction.                                               Minus Breeding, M.D.  JH/MEDQ  D:  09/30/2002  T:  10/01/2002  Job:  [id number]   cc:   Jerene Bears  [place]  [place] [address]  Fax: [phone]

## 2010-10-01 NOTE — H&P (Signed)
NAME:  [name] [name], [name] [name]                          ACCOUNT NO.:  [id number]   MEDICAL RECORD NO.:  [id number]                   PATIENT TYPE:  INP   LOCATION:  NA                                   FACILITY:  [facility]   PHYSICIAN:  Elizabeth Sauer, M.D.                   DATE OF BIRTH:  [birth year]   DATE OF ADMISSION:  11/25/2003  DATE OF DISCHARGE:                                HISTORY & PHYSICAL   ADMITTING DIAGNOSIS:  Nonunion, C4-5.   SERVICE:  Neurosurgery.   BODY OF TEXT:  This is a very nice now 60 year old right-handed white lady  who in September underwent an anterior cervical decompression and fusion at  C4-5 without a plate.  Postoperatively, she did well for a while and has now  had an increase in pain in her neck.  She does not have a lot of pain going  down her arms but has neck pain with movement and plain films demonstrate a  probable nonunion at C4-5 and she is now admitted for posterior cervical  augmentation of her fusion.   PAST MEDICAL HISTORY:  1. Medical history is remarkable for anterior cervical decompression and     fusions at C6-7 and at C5-6 by Drs. Leeroy Cha and Hosie Spangle     in the past.  2. She also has coronary artery disease with stents in place.  3. Hypertension.   MEDICATIONS:  1. Diovan 80 mg a day.  2. Cardizem 100 mg a day.  3. Lopressor 50 mg b.i.d.  4. Zocor 80 mg a day.  5. Vicodin 4 mg a day.  6. Celexa 20 mg daily.  7. Hormone replacement.   ALLERGIES:  Allergies are to PENICILLIN, SULFA and SOME ANTI-INFLAMMATORIES.   SURGICAL HISTORY:  Neck operation as noted before.   SOCIAL HISTORY:  She does not smoke, drinks on a limited social basis and is  on disability.   FAMILY HISTORY:  Mother is 44 years old and in poor health.  Daddy is  deceased; history is not given.   PHYSICAL EXAMINATION:  HEENT:  Her HEENT exam is within normal limits.  NECK:  She has well-healed incisions in her neck with reasonable range of  motion  but neck pain with motion.  CHEST:  Chest has diffuse crackles.  CARDIAC:  Regular rate and rhythm.  ABDOMEN:  Abdomen is nontender.  No hepatosplenomegaly.  EXTREMITIES:  Extremities without clubbing or cyanosis.  GU:  Exam is deferred.  PERIPHERAL VASCULAR:  Peripheral pulses are good.  NEUROLOGIC:  Neurologically, she is awake, alert and oriented.  Cranial  nerves are intact and motor exam shows 5/5 throughout the upper and lower  extremities.  There is no current sensory deficit.  Reflexes are absent at  the biceps on the left, 2 throughout the remainder of the upper extremities.  Lower extremity strength is full with no  sensory deficit.   IMAGING STUDIES:  Plain films have been reviewed and noted above.   CLINICAL IMPRESSION:  Nonunion at C4-5.   PLAN:  The plan is for a posterior cervical augmentation and fusion.  The  risks and benefits of this approach have been discussed with her and she  wishes to proceed.                                                Elizabeth Sauer, M.D.    MWR/MEDQ  D:  11/25/2003  T:  11/25/2003  Job:  [id number]

## 2010-10-01 NOTE — Cardiovascular Report (Signed)
NAMEALVERIA, [name] [name]                ACCOUNT NO.:  [id number]   MEDICAL RECORD NO.:  [id number]          PATIENT TYPE:  INP   LOCATION:  4704                         FACILITY:  [facility]   PHYSICIAN:  Minus Breeding, M.D.   DATE OF BIRTH:  1950/11/03   DATE OF PROCEDURE:  09/03/2004  DATE OF DISCHARGE:                              CARDIAC CATHETERIZATION   PRIMARY CARE PHYSICIAN:  Dr. Jerene Bears.   PROCEDURE:  Left heart catheterization, coronary arteriography.   INDICATION:  Evaluate patient with chest pain and previous stenting of her  LAD and right coronary artery.   PROCEDURE NOTE:  Left heart catheterization was performed via the right  femoral artery.  The artery was cannulated using an anterior wall puncture.  A 6 French arterial sheath was inserted via the modified Seldinger  technique.  Preformed Judkins and a pigtail catheter were utilized.  The  patient tolerated the procedure well and left the lab in stable condition.   RESULTS:   HEMODYNAMICS:  1.  LV 144/9.  2.  Aortic 143/72.   CORONARIES:  The left main was normal.  The LAD had proximal stent.  It was  widely patent.  There was mid diagonal which was moderate sized and normal.  The circumflex in the AV groove was normal proximally.  After an obtuse  marginal, this was a very small vessel that was normal.  There was a large  ramus intermediate which was normal.  There was a very large mid obtuse  marginal which was normal.  The right coronary artery was a dominant vessel.  There was an ostial stent which was patent with diffuse in-stent luminal  irregularities.  PDA was moderate size and normal.  There were two posterior  laterals which were small and normal.   LEFT VENTRICULOGRAM:  Left ventriculogram was obtained in the RAO  projection.  The EF was 60% with normal wall motion.   CONCLUSION:  1.  Nonobstructive residual coronary artery disease.  2.  Patent stents.  3.  Preserved ejection fraction.   PLAN:  The  patient will continue secondary risk reduction.  No further  cardiovascular testing is suggested.      JH/MEDQ  D:  09/03/2004  T:  09/03/2004  Job:  [id number]   cc:   Jerene Bears  [place]  [place] [address]  Fax: [place] in [place]i Cones, M.D. [facility]

## 2010-10-01 NOTE — H&P (Signed)
NAME:  [name] [name], [name] [name]                          ACCOUNT NO.:  [id number]   MEDICAL RECORD NO.:  [id number]                   PATIENT TYPE:  OIB   LOCATION:  NA                                   FACILITY:  [facility]   PHYSICIAN:  Elizabeth Sauer, M.D.                   DATE OF BIRTH:  Oct 02, 1950   DATE OF ADMISSION:  01/21/2003  DATE OF DISCHARGE:                                HISTORY & PHYSICAL   ADMITTING DIAGNOSES:  Spondylosis C4-C5.   HISTORY:  This is a very nice 60 year old right-handed white lady who I saw  in the office in June.  She had had an operation at C6-7 by Leeroy Cha,  M.D. and 5-6 by Hosie Spangle, M.D. in 1997.  When I saw her she had  electrical current going down her left arm particularly when she turned her  head towards the left.  She had numbness in the middle three fingers of left  hand, some back and leg pain.   PAST MEDICAL HISTORY:  1. Coronary artery disease.  She has a couple stents in place.  2. Hypertension.   MEDICATIONS:  1. Diovan 80 mg daily.  2. Cardizem 180 mg daily.  3. Lopressor 50 mg b.i.d.  4. Zocor 80 mg daily.  5. Vicodin 4 daily.  6. Celexa 20 mg daily.  7. Medicine for hot flashes.   ALLERGIES:  PENICILLIN, SULFA.   PAST SURGICAL HISTORY:  Neck operation noted before.   SOCIAL HISTORY:  She does not smoke.  Drinks on a very limited social basis.  On disability.   FAMILY HISTORY:  Mom is 12 years old, in poor health.  Daddy is deceased.  History is not given.   PHYSICAL EXAMINATION:  HEENT:  Within normal limits.  NECK:  She has well healed incisions on her neck.  She has reasonable range  of motion but it does reproduce her left shoulder and arm pain.  CHEST:  Diffuse crackles.  CARDIAC:  Regular rate and rhythm.  ABDOMEN:  Nontender.  No hepatosplenomegaly.  EXTREMITIES:  Without clubbing or cyanosis.  GENITOURINARY:  Deferred.  PERIPHERAL PULSES:  Good.  NEUROLOGIC:  She is awake, alert, and oriented.  Cranial  nerves are intact.  Motor examination shows 5/5 strength throughout the upper and lower  extremities at this time.  Sensory deficit describes in the C7 distribution  on the left side.  Reflexes are absent at the biceps on the left, 2  throughout the remainder of the upper extremities.  Lower extremity strength  is full.  There is no sensory deficit.  Dysesthesia is described in an L5-S1  distribution.  Reflexes are 2 at the knees, 2 at the right ankle, absent at  the left and straight leg raise is negative.   LABORATORIES:  She had an MR done in plain films that demonstrates  significant spondylitic disease at  C4-5 which is the level above her old  plate.   IMPRESSION:  She has neck and left arm pain.   PLAN:  Anterior cervicectomy and fusion at C4-5.  We are not going to take  out the old plate.  Will do a simple bone graft, leave her in a collar for  six weeks.  We feel this will reduce the risk to her esophagus, the amount  of dissection that has to take place.  There is some benefits to this  approach and discussed with her and she wished to proceed.                                                Elizabeth Sauer, M.D.    MWR/MEDQ  D:  01/21/2003  T:  01/21/2003  Job:  [id number]

## 2010-10-01 NOTE — H&P (Signed)
NAMEMarland Kitchen  SEBA, TSCHOEPE                          ACCOUNT NO.:  [id number]   MEDICAL RECORD NO.:  [id number]                   PATIENT TYPE:  INP   LOCATION:  2104                                 FACILITY:  [facility]   PHYSICIAN:  Deboraha Sprang, M.D.               DATE OF BIRTH:  1950-09-04   DATE OF ADMISSION:  09/28/2002  DATE OF DISCHARGE:                                HISTORY & PHYSICAL   CHIEF COMPLAINT:  Chest pain.   HISTORY OF PRESENT ILLNESS:  This is a 60 year old white female with history  of coronary artery disease, hypertension, hyperlipidemia and positive family  history, presents with chest pain and shortness of breath. The patient  states the chest pain started at 2 p.m. while at the [employer] working on boat,  associated with shortness of breath and diaphoresis.  The pain was described  as a pressure chest pain radiating over the left breast, 7 out of 10,  similar to previous chest pain for which she has been cathed for in the  past.  The patient states over the last two weeks she has been having  increased lower extremity edema when on feet, sometimes with the left leg  being greater than the right leg. However, when the leg is elevated lower  extremity edema disappears.  The patient denies PND, orthopnea, or nausea  and vomiting with chest pain. The patient also states she has been more  fatigued over the last two weeks.  The patient states that chest pain was  associated with exertion considering chest pain continued with exertion,  relieved with nitroglycerin. Husband recommended that she follow up with a  physician, therefore, presented to the emergency room and transferred to  [facility] for consultation by [facility].   PAST MEDICAL HISTORY:  1. Hypertension x 15 years.  2. Arthritis.  3. Coronary artery disease with a stent in 2000, at [facility]. Cathed in     2001 showing left main free of disease, stent in LAD, 20-30% mid stent     restenosis, 50%  stenosis in the first diagonal circumflex with 50% first     marginal RCA with a stent 40% stenosis. 70% stenosis in the right     ventricular branch.  Overall ejection fraction on this catheterization     was 67%.  4. Hyperlipidemia on a statin.  5. Depression.   PAST SURGICAL HISTORY:  Four back and neck surgeries, 1990, 1991, 1997 and  1999. Appendectomy in 1995.   FAMILY HISTORY:  Mom age 84 with Parkinson's, osteoporosis, CVA and  hypertension.  Father passed away at age 18 from an MI and hypertension.  Two brothers with MI at 59 and one with hypertension.   SOCIAL HISTORY:  The patient lives in [place], [place] with her husband.  She is retired, disability secondary to back pain x 2 years.  Sales promotion account executive  for [employer].  She has a 31 year old son who is healthy.  Tobacco,  quit 2 years ago, one pack a day x 25 years.  Occasional alcohol.  No drugs.   ALLERGIES:  SULFA causes rash and hives. PENICILLIN, rash and hives.  ZESTRIL, laryngeal swelling and shortness of breath.  CARDURA, laryngeal  swelling and shortness of breath.   MEDICATIONS:  1. Lopressor 50 b.i.d.  2. Diovan 80 daily.  3. Cardizem 180 daily.  4. Multivitamin daily.  5. Aspirin 325 daily.  6. OS-Cal 600 b.i.d.  7. Vicodin 1-2 tablets q.6h.  8. Lipitor 20 q.h.s.  9. Celexa 20 daily.   REVIEW OF SYSTEMS:  Positive for shortness of breath, nausea, fatigue. Lower  extremity edema, left greater than right.  Migraine headaches. Diaphoresis,  __________ pain.  No fevers, chills, urinary symptoms, or vision changes.   PHYSICAL EXAMINATION:  VITAL SIGNS:  Afebrile, 97.2.  Blood pressure 138/70.  Pulse 75, respiratory rate 16.  Saturation 99% on 2 liters.  GENERAL: Alert and oriented x 3.  No apparent distress.  HEENT:  No significant JVP.  No thyromegaly. No carotid bruits.  NECK:  Supple.  CHEST:  Clear to auscultation bilaterally.  CARDIOVASCULAR: Normal S1 and S2. No murmurs, rubs or gallops.   Nondisplaced  PMI.  EXTREMITIES:  Strong radial, femoral and pedal pulses. No clubbing, cyanosis  or edema.   LABORATORY DATA:  From an outside hospital: White count 7.5, platelet count  221, with a crit of 36.5. Chemistry remarkable for a sodium 138, potassium  3.8, chloride 105, bicarb 28, BUN 11, creatinines 0.9, glucose 117, calcium  9.1. Coags:  PTT of 23, INR of 1, PT of 12.2.  First set of cardiac enzymes  at the outside hospital showing a CK of 57, MB of 1.9 and Troponin of 0.34.   EKG, showing normal sinus rhythm at a rate of 60.  Chest x-ray:  Hyperexpanded lung fields. Normal cardiomediastinal silhouette.  No pleural  effusions.   ASSESSMENT:  This is a 60 year old white female with a history of coronary  artery disease, hypertension, hyperlipidemia and a positive family history,  who presents with typical chest pain as her previous stenosis before heart  catheterization.   PLAN:  1. Chest pain. Considering the patient on aspirin for the last seven days,     multiple risk factors, positive cardiac enzymes, two bouts of chest pain     in the last 24 hours, will continue Lovenox and Integrilin as started per     outside physician. Continue aspirin 325 mg a day.  Start Metoprolol 25 mg     p.o. q.6h. She is allergic to an ACE inhibitor, therefore, I will     continue her angiotensin receptor blocker.  Considering acute coronary     syndrome will place her on atorvastatin 80 mg q.h.s. The patient is     currently chest pain free. Will titrate the ARB and beta blocker to     optimize rate pressure product. We will continue to rule her out with two     more sets of cardiac enzymes and EKGs.  2. Hypertension. Titrate beta blocker and ARB to effect.  3. Hyperlipidemia.  Check fasting lipid panel.  Increase Lipitor to 80 mg     q.h.s.  4. Depression. Continue Celexa.  We will check a thyroid panel.  5. Full code.    Iona Beard L. Andree Elk, M.D.  Deboraha Sprang,  M.D.    GLA/MEDQ  D:  09/29/2002  T:  09/29/2002  Job:  [id number]

## 2010-10-01 NOTE — Discharge Summary (Signed)
NAME:  [name] [name], [name] [name]                          ACCOUNT NO.:  [id number]   MEDICAL RECORD NO.:  [id number]                   PATIENT TYPE:  INP   LOCATION:  [id number]                                 FACILITY:  [place]   PHYSICIAN:  Deboraha Sprang, M.D.               DATE OF BIRTH:  [birth year]   DATE OF ADMISSION:  09/28/2002  DATE OF DISCHARGE:  09/30/2002                           DISCHARGE SUMMARY - REFERRING   PROCEDURES:  1. Cardiac catheterization.  2. Coronary arteriogram.  3. Left ventriculogram.   HOSPITAL COURSE:  [name] is a 60 year old female with a history of  coronary artery disease who had two stents in 2000.  Her last  catheterization was in 2001 and was without critical disease.  On the day of  admission she developed chest pressure that radiated to her jaws and was  associated shortness of breath and diaphoresis.  She went to [facility] and was evaluated there.  It was felt that her symptoms were  consistent with unstable anginal pain and she was transferred to [facility] for further evaluation and treatment.   Her initial troponin at [facility] had been slightly abnormal at 0.34 but was  below the cutoff  for myocardial infarction.  She had two normal troponins  here and all CK-MBs were normal.  It was felt that she had not had an MI but  that catheterization was still indicated.   She had a cardiac catheterization on 09/30/02 which showed a normal left  main, and an LAD that had a proximal stent which is widely patent and no  other disease.  Additionally in the circumflex the A-V groove was normal and  there was a ramus intermedius that was a large branching vessel with a long  proximal 30% stenosis.  The RCA was dominant and was a large vessel with a  30-40% stenosis in the __________ proximal area.  This was InStent  restenosis.  Right ejection fraction was 65% with normal wall motion.  The  films were reviewed by Dr. Percival Spanish who felt that she  had preserved left  ventricular function and two-vessel disease with patent stents.  He felt  that secondary risk factor reduction was indicated and no further  cardiopulmonary testing was indicated at this time.  She had been on Lipitor prior to admission and a fasting lipid profile was  obtained which showed a total cholesterol of 178, triglycerides 649, HDL 37  and LDL not calculated.  She had her Lipitor 20 changed to Zocor 80 mg daily  and was given a prescription for this at discharge.  She is to get liver  function testing and follow up cholesterol profile per Dr. Dannielle Burn.  Additionally, consideration may be given to adding either TriCor or Niaspan  to her medication regimen.  Ms. Petron was initially hypotensive upon admission secondary to Morphine and  nitroglycerin.  She states that she  has this problem when she receives these  medications but is otherwise hypertensive and fairly difficult to control.  She had been taking Lopressor 50 b.i.d., Diovan 80 daily and Cardizem CD 80  daily prior to admission.  The Lopressor and the Diovan were gradually added  back in and at discharge she is to add back in the Cardizem as well as her  systolic blood pressure is generally in the 140 to 150 range.  Pending completion of bed rest and ambulation and ambulation without chest  pain, shortness of breath or any problems with her groin, she is considered  stable for discharge on 09/30/02 p.m. with outpatient follow up.   LABORATORY VALUES:  Hemoglobin 12.8, hematocrit 38.6, WBC 6200, platelets  228,000.  Sodium 137, potassium 3.5, chloride 103, PO2 28, BUN 10,  creatinine 0.7, glucose 90.  Serial CK-MB and troponin are negative  for  myocardial infarction.  Urinalysis negative.   DISCHARGE CONDITION:  Stable.   DISCHARGE DIAGNOSES:  1. Chest pain, no critical coronary artery disease by catheterization this     admission.  2. Status post percutaneous transluminal coronary angioplasty and  stent     placement to the left anterior descending and PTCA and stent to the right     coronary artery.  3. Preserved left ventricular function with 30% InStent restenosis on both     stents by catheterization this admission.  4. Hyperlipidemia.  5. Hypertension.  6. Arthritis.  7. Depression.  8. History of back and neck surgery.  9. Status post appendectomy.  10.      Family history of coronary artery disease.  11.      ALLERGIES TO SULFA, PENICILLIN, ZESTRIL AND CARDURA.   DISCHARGE INSTRUCTIONS:  1. Her activity level is include no driving, sexual or strenuous activity     for three days.  She is to stick to low fat and salt diet.  She is to     call the office for problems with the catheterization site.  She is to     see an M.D. or P.A. at Dr. Arlina Robes office in two weeks.  She is to     follow up with Dr. Ortencia Kick needed.   DISCHARGE MEDICATIONS:  1. Lopressor 50 mg b.i.d.  2. Diovan 80 mg daily.  3. Cardizem CD 180 mg daily.  4. Multivitamin daily.  5. Aspirin 325 mg daily.  6. Os-Cal 600 mg b.i.d.  7. Vicodin p.r.n.  8. Celexa 20 mg daily.  9. Zocor 80 mg daily (she is not to take Lipitor for now).     Kronberg Gourd, P.A. [facility]                  Deboraha Sprang, M.D.    RG/MEDQ  D:  09/30/2002  T:  10/01/2002  Job:  [id number]   cc:   Jerene Bears  [place] [id number]  Fax: [place], [place]

## 2010-10-01 NOTE — Cardiovascular Report (Signed)
[place]  Patient:    [name] [name], [name] [name]                   MRN: [id number] Proc. Date: 08/10/99 Adm. Date:  [id number] Attending:  Ernestine Mcmurray CC:         Terald Sleeper, M.D.                        Cardiac Catheterization  INDICATIONS:  [name] [name] is a very pleasant 60 year old well known to me.  She has previously undergone percutaneous intervention of the left anterior descending nd the ostium of the right coronary artery previously.  She now presents with recurrent symptoms.  The current study was done to assess coronary anatomy.  Of  note, she has had up and down blood pressures.  PROCEDURE:  Left heart catheterization, selective coronary angiography, selective left ventriculography, proximal root aortography, distal aortography, left ventriculography.  DESCRIPTION OF PROCEDURE:  The procedure was performed from the right femoral artery using 6 French catheters.  She had some chest pain at the completion of he procedure which was resolved by control of her blood pressure.  She had marked hypertension throughout the procedure and was given three doses of intravenous Lopressor as well as a dose of 20 mg of labetalol.  Blood pressure eventually came down.  She was taken to the holding area in satisfactory condition.  HEMODYNAMIC DATA:  The initial central aortic pressure was 181/97, LV pressure 184/19.  There was no gradient on pullback across the aortic valve.  ANGIOGRAPHIC DATA:  Ventriculography in the RAO projection reveals preserved global systolic function, no segmental abnormalities, or contraction were identified.   Distal aortography revealed widely patent renal arteries.  There was no significant renal artery narrowing.  The aortic root revealed a trileaflet aortic valve. There was no aortic regurgitation.  There was no obvious dissection.  The left main coronary artery was free of significant disease.  The left anterior  descending artery demonstrates evidence of a previously placed stent.  On plain fluoroscopy, this is obvious.  The stented site is relatively patent although there is an area in the proximal to mid portion of the stented rea that is about 50% in luminal reduction.  The diagonal branch has about a 70% area of narrowing which takes off in this area.  This diagonal branch is relatively small.  The mid and distal LAD is widely patent.  There is a large ramus intermedius that has about 40 to 50% segmental plaquing throughout the proximal vessel.  The distal vessel is widely patent.  The AV circumflex provides a large marginal branch and is free of significant disease.  The ostium of the right coronary artery demonstrates some narrowing.  At most severe point, there appears to be between 40 and 60% narrowing right at the ostium and extending into the stent.  However, this does not appear to be high grade and there would be at least about a 2 mm residual lumen.  The vessel distal to this  point demonstrates perhaps mild luminal irregularity with perhaps 40% narrowing at the origin of the posterior descending.  The right ventricular branch has a 70%  ostial narrowing.  CONCLUSIONS: 1. Continued patency of the left anterior descending stent with perhaps 50%    narrowing in the proximal to mid portion of the stent. 2. Continued patency of the right coronary artery stent with anywhere from 40 to    60%  narrowing.  DISPOSITION:  The patient has labile hypertension.  A 24-hour urine will need to be done.  She also needs a stress Cardiolite to rule out ischemia when blood pressure is controlled.  I will discuss her case with Dr. Terald Sleeper and we will decide n a treatment plan. DD:  08/10/99 TD:  08/11/99 Job: [id number]

## 2010-10-01 NOTE — Discharge Summary (Signed)
[place]  Patient:    [name] [name], [name] [name]                   MRN: [id number] Adm. Date:  [id number] Disc. Date: 08/12/99 Attending:  Ernestine Mcmurray Dictator:   Arline Asp, P.A. CC:         Terald Sleeper, M.D. [facility] - [address]             Dr. Woody Seller - primary care, [place], [place]                           Discharge Summary  DATE OF BIRTH:  August 17, 1950  DISCHARGE DIAGNOSES: 1. Coronary artery disease with approximately 50%-60% in-stent restenosis    of the left anterior descending coronary artery.  The patient having an    adenosine Cardiolite post-catheterization, which was negative for ischemia. 2. Continued tobacco abuse. 3. Hypercholesterolemia.  The patient started on Lipitor during hospitalization. 4. Hypertension. 5. Gastroesophageal reflux disease. 6. Coronary artery disease with a history of percutaneous coronary intervention    to the left anterior descending coronary artery and right coronary artery in    the past.  HISTORY OF PRESENT ILLNESS:  The patient is a 60 year old female with a past medical history as outlined below.  The patient was seen by Dr. Terald Sleeper on August 03, 1999, with recurrent chest pain, and a stress echocardiogram was done on August 06, 1999, which revealed possible new inferior wall motion abnormalities.  The patient was asked to see Dr. Denice Bors. C[place], to have a cardiac catheterization arranged.  The patient continued to have complaints of chest pain, some with exertion, and also with stress, and similar to symptoms to prior PTCA pain. She gets nauseated but denies shortness of breath or diaphoresis.  The patient had ome chest pain in the office while seeing Dr. Denice Bors. Crenshaw, and she was subsequently admitted to [facility] for a cardiac catheterization.  HOSPITAL COURSE:  The patient was taken to the cardiac catheterization laboratory by Dr. Loretha Brasil.  Stuckey on August 10, 1999.  It was felt that the patient had a  proximal LAD stenosis of around 50%, possibly up to 70%, and RCA stenosis of around 50%.  Dr. Lia Foyer favored a stress Cardiolite to assess ischemia.  The patient had a adenosine Cardiolite on August 12, 1999, which revealed no ischemia and the ejection fraction of 58%.  The patient was subsequently discharged home in stable condition.  LABORATORY DATA:  Cholesterol 255, triglycerides 233, HDL 35, LDL 173.  All cardiac enzymes were negative.  DISCHARGE MEDICATIONS: 1. Lipitor 20 mg q.h.s., which is new. 2. Lopressor 25 mg b.i.d. 3. Aspirin q.d. 4. Nortriptyline 50 mg q.d. 5. Estratest. 6. Provera. 7. Claritin. 8. Cozaar 50 mg t.i.d. 9. Prilosec 20 mg q.d.  DIET:  She is told to continue a low-fat, low-cholesterol diet.  INSTRUCTIONS:  She is strongly urged to stop smoking.  FOLLOWUP:  She will follow up with Dr. Dannielle Burn in four week at the [facility].  She will call for this appointment.  The patient had a urine test for pheochromocytoma, which is pending at the time of discharge. DD:  08/12/99 TD:  08/12/99 Job: [id number]

## 2010-10-01 NOTE — Consult Note (Signed)
[name] [name], [name] [name]                ACCOUNT NO.:  [id number]   MEDICAL RECORD NO.:  [id number]          PATIENT TYPE:  AMB   LOCATION:  DAY                           FACILITY:  [facility]   PHYSICIAN:  Caro Hight, M.D.      DATE OF BIRTH:  [birth year]   DATE OF CONSULTATION:  02/21/2006  DATE OF DISCHARGE:                                   CONSULTATION   REFERRING PHYSICIAN:  Dr. Jerene Bears.   PROBLEM LIST:  1. Chronic abdominal pain and diarrhea.  2. Intermittent hematochezia.  3. Coronary artery disease.  4. Diverticulosis.  5. Hyperlipidemia.  6. Hypertension.  7. Cervical degenerative disk disease.   SUBJECTIVE:  [name] [name] is a 60 year old female who presents as a return  patient visit.  She was last seen for an upper endoscopy on October 1 which  was performed for hematemesis.  The upper endoscopy was normal.  Biopsies  were negative for celiac sprue.  She had a CBC drawn at that time which  showed a white count of 7.9, hemoglobin of 12.4, and a platelet count of  326.  She presents complaining of hurting on the left side.  She complains  of diarrhea off and on since August.  Tuesday she was feeling better and  used Fibersure once a day.  She ate regular.  From Tuesday to Friday, she  was having sausage-like smooth and soft stool.  Friday morning, she received  a phone call in regards to the unexpected death of a family member.  Then  Saturday after drinking 1 sip of coffee, she began to have watery stool.  She took 2 Levbid and had a bowel movement and felt better.  Sunday, she  again had entirely liquid stool.  It was not associated with abdominal  cramping like it was on Saturday.  Her first bowel movement was completely  watery.  The second time she reports it poured out blood.  She took 2  Levbid at once.  She did not take any fiber that day.  On Monday, she went 3  times but saw no blood.  She did not use the Levbid.  Tuesday, she had no  bowel movement but lots of gas.   She again has taken no Levbid.  She  complains of feeling dizzy.  In the course of her diarrhea, she has had a  decrease in her blood pressure which she has measured as low as 90.  She  reports feeling dizzy today.   MEDICATIONS:  1. Levbid every 12 hours p.r.n.  2. Hydrocodone 10/325 mg 4 times a day.  3. Diovan 80 mg daily.  4. Cardizem 180 mg daily.  5. Labetalol 100 mg 2 daily.  6. Nexium 40 mg daily.  7. Prempro.  8. Celexa 40 mg daily.  9. Red yeast rice.  10.Aspirin 81 mg daily.  11.Allegra 180 mg daily.  12.Lomotil p.r.n.  13.Xanax 1 mg p.r.n.  14.Diclofenac 75 mg b.i.d.   OBJECTIVE:  VITAL SIGNS:  Weight 146 pounds (down 1-1/2 pounds in 2 weeks),  height 5 feet 3 inches,  BMI 25.9 (slightly overweight), temperature 98,  blood pressure 98/56, pulse 72.  GENERAL:  She is in no apparent distress,  alert, oriented x4.  HEENT EXAM:  Atraumatic, normocephalic.  Pupils equal and reactive to light.  Mouth:  No oral lesions.  Posterior pharynx:  Without erythema or exudate.  NECK:  Full range of motion.  No lymphadenopathy.  LUNGS:  Clear to auscultation bilaterally.  CARDIOVASCULAR EXAM:  Regular rhythm.  No murmur.  Normal S1 and S2.  ABDOMEN:  Bowel sounds are present.  Soft, nontender, nondistended.  No  rebound, no guarding.  RECTAL:  Good sphincter tone, nontender, no masses, palpable internal  hemorrhoids in the canal, no prolapse with Valsalva, trace heme-positive, no  gross blood on exam, watery stool in vault.   ASSESSMENT:  [name] [name] is a 60 year old female with bloody diarrhea  secondary to hemorrhoids and likely irritable bowel.  The differential  diagnoses includes thyroid disease and inflammatory bowel disease.  Her  slowly declining blood pressure is likely due to volume depletion.  She has  a low likelihood of colon cancer or colon polyps.  Thank you for allowing me  to see [name] [name] in consultation.  My recommendations follow.   RECOMMENDATIONS:  1. I  recommend she increase fibersure to twice daily for 2 weeks.  She      should initiate Anusol-HC suppositories in her rectum twice daily for 2      weeks.  2. For chronic management of IBS, she is to increase her Fibersure to      twice daily.  She is to take the Levbid twice regardless.  3. She is to drink plenty of fluids.  She should hold the Diovan and take      1 labetalol daily.  She is asked to measure her blood pressure 3 times      a week.  She is to call me when her systolic blood pressure gets over      160 and I will give her further instructions in regards to her blood      pressure medication.  She has a followup appointment on the 25th with      her cardiologist.  4. She is asked to stop taking the Carafate.  She may follow up with me in      6 weeks.  5. Colonoscopy will be scheduled with a 2-day MiraLax prep within the next      3 to 4 weeks.  She may use docusate sodium 100 mg 2 to 3 times a day to      soften stool if she becomes constipated.   Please feel free to contact me at [phone] with additional questions.      Caro Hight, M.D.  Electronically Signed     SM/MEDQ  D:  02/21/2006  T:  02/22/2006  Job:  [id number]   cc:   Jerene Bears  Fax: [phone]

## 2010-10-01 NOTE — Op Note (Signed)
NAMECHAR, MERTZ                ACCOUNT NO.:  [id number]   MEDICAL RECORD NO.:  [id number]          PATIENT TYPE:  AMB   LOCATION:  DAY                           FACILITY:  [facility]   PHYSICIAN:  Caro Hight, M.D.      DATE OF BIRTH:  1950-11-12   DATE OF PROCEDURE:  02/13/2006  DATE OF DISCHARGE:                                 OPERATIVE REPORT   REFERRING PHYSICIAN:  Dr. Woody Seller.   PROCEDURE:  EGD with cold forceps biopsies.   INDICATION FOR EXAMINATION:  [name] [name] is a 60 year old female who presents  with reported hematemesis and chronic watery diarrhea with weight loss over  the past 6 weeks with hematochezia on anti-inflammatory drugs.   FINDINGS:  1. Normal esophagus without evidence of Barrett's or stricture.  2. Normal stomach without evidence of erythema or erosion.  3. Normal appearing duodenum.  Biopsies obtained via cold forceps to      evaluate for celiac sprue.   RECOMMENDATIONS:  1..  Follow up biopsies.  The patient will be scheduled  for an appointment with Dr. Stann Mainland in 7-10 days.  We will obtain a CBC today.  If her biopsies are negative for celiac sprue, she will be initiated on a  high-fiber diet.  I will consider a repeat colonoscopy after speaking with  her at the follow up visit.  She should continue her Levbid, Carafate, and  Nexium.  1. Avoid aspirin and NSAIDs indefinitely.  Tylenol is okay.   MEDICATIONS:  1. Demerol 1 mg IV.  2. Versed 8 mg IV.   PROCEDURE TECHNIQUE:  Physical was performed and informed consent was  obtained from the patient after explaining the benefits, risks and  alternatives to the procedure.  The patient was connected to the monitor and  placed in the left lateral position.  Continuous oxygen was provided by  nasal cannula and IV medicine administered through an indwelling cannula.  After administration of sedation and rectal exam, the patient's rectum was  intubated  and the scope was advanced under direct visualization to the  second portion  of the duodenum.  The scope was subsequently removed slowly by carefully  examining the color, texture, anatomy and integrity of the mucosa on the way  out.  The patient was recovering in the endoscopy suite and discharged to  home in satisfactory condition.      Caro Hight, M.D.  Electronically Signed     SM/MEDQ  D:  02/13/2006  T:  02/14/2006  Job:  [id number]   cc:   Jerene Bears  Fax: [phone]

## 2010-10-01 NOTE — Assessment & Plan Note (Signed)
[place] OFFICE NOTE   NAME:[name] [name], [name] [name]                       MRN:          [id number]  DATE:03/09/2006                            DOB:          1950/06/23    PRIMARY CARDIOLOGIST:  Dr. Terald Sleeper   REASON FOR OFFICE VISIT:  Annual followup.   The patient presents for routine annual followup, but has not been seen  since we transferred her from [facility] to [facility] for a  diagnostic coronary angiogram in April 2006.  She has known coronary artery  disease with prior stenting of the proximal LAD and ostial RCA in 2000.  Cardiac catheterization in April 2006, however, showed continued patency of  both stent sites with otherwise nonobstructive residual coronary artery  disease and normal left ventricular function.  The patient did not return to  our clinic for scheduled followup secondary to an illness in her family.   Since then, however, the patient reports that she continues to do well from  a cardiovascular standpoint with no interim development of any exertional  angina pectoris.  She continues to remain active and can easily climb a  flight of stairs with no associated dyspnea or chest discomfort.   The patient has not smoked tobacco in 6 years.   The patient has had a recent extensive evaluation for persistent diarrhea  and was diagnosed with inflammatory bowel syndrome.   Electrocardiogram today reveals normal sinus rhythm at 81 BPM with normal  axis and no ischemic changes.   CURRENT MEDICATIONS:  1. Diovan 80 daily.  2. Labetalol 100 b.i.d.  3. Cardizem 180 daily.  4. Nexium 40 daily.  5. Vicodin 10/325 q.i.d.  6. Aspirin 81 daily.  7. Red yeast rice b.i.d.   PHYSICAL EXAMINATION:  VITAL SIGNS:  Blood pressure 160/90, pulse 86 and  regular, weight 149.  GENERAL:  A 60 year old female sitting upright in no apparent distress.  HEENT:  Normocephalic, atraumatic.  NECK:   Palpable bilateral carotid pulses without bruits.  LUNGS:  Clear to auscultation in all fields.  HEART:  Regular rate and rhythm (S1, S2).  Positive S4.  No significant  murmurs.  ABDOMEN:  Soft, nontender with intact bowel sounds.  EXTREMITIES:  Palpable pulse without significant pedal edema.   IMPRESSION:  1. Coronary artery disease.      a.     Nonobstructive coronary artery disease with widely patent stent       sites by cardiac catheterization April 2006.      b.     Status post stent proximal left anterior descending artery and       ostial right coronary artery 2000.      c.     Preserved left ventricular function.  2. Hyperlipidemia.      a.     Total cholesterol 240, triglycerides 300, HDL 42, and LDL 139 by       previous profile in April 2006.      b.     Previously intolerant to Zocor, Lipitor and fish oil.  3. History  of tobacco, stopped smoking approximately 6 years ago.  4. Hypertension, currently uncontrolled.  5. History of depression.   PLAN:  1. Increase Cardizem from 180 to 240 daily for better blood pressure and      heart rate control.  Of note, the patient reports to me today that she      has had a long history of occasional fluttering which is essentially      asymptomatic and brief in duration.  I therefore felt that increasing      this medication would help in this regard, as well as provide better      blood pressure control.  Of note, the patient was on Toprol-XL when      last seen by [place] a year-and-a-half ago, but this has since been changed      to labetalol for blood pressure control.  2. Return clinic visit in 1 week for a blood pressure/pulse check.  Will      also check a fasting lipid profile at that time.  We will need to      reassess her lipid status and decide whether or not to place her on a      lipid lowering agent.  She has been reportedly intolerant to prior      statins.  3. Resume regular followup with Dr. Dannielle Burn in 1 year.      ______________________________  Mannie Stabile, PA-C    ______________________________  Ernestine Mcmurray, MD,FACC   GS/MedQ  DD: 03/09/2006  DT: 03/10/2006  Job #: [id number]   cc:   Jerene Bears

## 2010-10-01 NOTE — Cardiovascular Report (Signed)
[facility]  Patient:    [name] [name], VEST                   MRN: [id number] Proc. Date: 03/06/00 Adm. Date:  [id number] Attending:  Wadie Lessen CC:         CV Laboratory  Jerene Bears, M.D.  Terald Sleeper, M.D.   Cardiac Catheterization  INDICATIONS:  Ms. Noralee Stain is a delightful 59 year old female who has known coronary artery disease.  She has had previous stenting of the left anterior descending artery over a long area and in the ostium of the right coronary artery.  She has developed some recurrent chest pain and has been referred back for cardiac catheterization.  PROCEDURES: 1. Left heart catheterization. 2. Selective coronary arteriography. 3. Selective left ventriculography. 4. Proximal root aortography. 5. Distal aortography.  DESCRIPTION OF PROCEDURE:  The procedure was performed from the right femoral artery using 6 French catheters.  She tolerated the procedure well. There were no complications.  A Noto catheter was utilized to enter the right coronary ostium.  The patient did have elevated blood pressure and was given intravenous Lopressor 5 mg x 2, and also given 20 mg of intravenous labetalol. She tolerated the procedure without complication.  HEMODYNAMICS:  The initial central aortic pressure before medication was 193/104, LV pressure 187/21.  No gradient on pullback across the aortic valve.  ANGIOGRAPHIC DATA:  LEFT VENTRICULOGRAPHY:  Ventriculography in the RAO projection reveals preserved global systolic function.  No segmental abnormalities contraction are identified.  Ejection fraction was 67%.  Proximal root aortography did not reveal evidence of significant dissection. There was no evidence of aortic regurgitation.  The aortic leaflets moved well.  The ostium of the right coronary where the right stent was placed was patent.  DISTAL AORTOGRAM:  The distal aortogram demonstrates patent renal  arteries bilaterally.  There is some atherosclerotic change in the distal aorta.  The left main coronary artery is free of critical disease.  The left anterior descending artery on plain fluoroscopy demonstrates evidence of a stent.  It is a long area.  There is perhaps 20-30% narrowing in the midportion of the stent, but this does not appear to be significant.  The stent ends with a smooth transition into the LAD and the distal LAD is patent all the way to the apex where it wraps the apex.  There was perhaps a small first diagonal branch that has about 50% ostial narrowing in its takeoff from the stent covered LAD.  The circumflex consists of a large ramus or first marginal branch that has some segmental 40-50% plaquing in the proximal portion.  This appears to be perhaps less significant than on the previous study and does not appear to be high grade.  The AV circumflex itself has a steep loop in the midportion but no critical stenoses.  It parallels the intermediate vessel.  The right coronary artery demonstrates evidence of a stent at the ostium.  The ostium is not limited and can be entered.  There appears to be fairly good reflux.  There is evidence of some mild intimal hyperplasia, with perhaps 40% narrowing throughout the stent and slightly more at the distal most aspect. However, this does not appear to be flow-limiting and would be as large as 2 mm or slightly more.  The mid and distal vessels demonstrates some mild luminal irregularities.  There is a small right ventricular branch that has about 70% proximal narrowing.  The  remainder of the RCA is without critical disease.  CONCLUSIONS: 1. Normal left ventricular function. 2. No evidence of aortic dissection. 3. Widely patent renal arteries despite significant hypertension. 4. No evidence of stent re-stenosis in the left anterior descending. 5. No evidence of significant stent re-stenosis in the right coronary artery     ostium.  DISPOSITION:  The patient will be treated medically.  The exact etiology of her chest pain is unclear.  She will need better blood pressure control.  I will have Dr. Dannielle Burn see her in the morning. DD:  03/06/00 TD:  03/07/00 Job: [id number]

## 2010-10-01 NOTE — H&P (Signed)
NAME:  [name] [name], [name] [name]                          ACCOUNT NO.:  [id number]   MEDICAL RECORD NO.:  [id number]                   PATIENT TYPE:  INP   LOCATION:  NA                                   FACILITY:  [facility]   PHYSICIAN:  Elizabeth Sauer, M.D.                   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   BODY OF TEXT:  This is a now 60 year old right-handed white lady who  underwent an anterior cervical diskectomy and fusion at C4-5 about a year  ago.  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  She came in July 12 for a  posterior physician but slipped in the [place] head holder.  The case was  postponed and she is now admitted for posterior cervical fusion at C4-5.   PAST MEDICAL HISTORY:  Remarkable for coronary artery disease with stents  and hypertension.   She is on:  1.  Diovan 80 mg a day.  2.  Cardizem 180 mg a day.  3.  Lopressor 50 mg b.i.d.  4.  Zocor 80 mg a day.  5.  Vicodin four times a day.  6.  Celexa 20 mg a day.  7.  Hormone replacement for hot flashes.   ALLERGIES:  PENICILLIN and SULFA.   PAST SURGICAL HISTORY:  Neck operation noted before.   SOCIAL HISTORY:  She does not smoke, drinks on a very limited social basis.  Is on disability.   FAMILY HISTORY:  Mom is 45 years old, in poor health.  Daddy is deceased,  history is not given.   PHYSICAL EXAMINATION:  HEENT:  Within normal limits.  NECK:  She has well-healed incisions on her neck, reasonable range of  motion, but does reproduce her left shoulder and arm pain.  CHEST:  Diffuse crackles.  CARDIAC:  Regular rate and rhythm.  ABDOMEN:  Nontender with no hepatosplenomegaly.  EXTREMITIES:  Without clubbing or cyanosis.  GENITOURINARY:  Deferred.  VASCULAR:  Peripheral pulses are good.  NEUROLOGIC:   She is awake, alert and oriented.  Cranial nerves are intact.  Motor exam reveals 5/5 strength throughout the upper and lower extremities  at this time.  Sensory deficit described in the C7 distribution on the left.  Reflexes are absent at the biceps on the left, 2-3/3 throughout the  remainder of the upper extremities reflexes.  Lower extremity strength is  full.  There is no sensory deficit.  Dysesthesia as described, L5-S1  distribution on the left.  Reflexes are 2 at the knees, 2 at the right  ankle, absent at the left, and straight leg raise is positive.   MR and plain films demonstrate nonunion at  C4-5.   CLINICAL IMPRESSION:  Nonunion, C4-5.   The plan is for a posterior cervical fusion at C4-5.  The risks and benefits  of this approach have been discussed with her, and she wishes to proceed.                                                Elizabeth Sauer, M.D.    MWR/MEDQ  D:  01/23/2004  T:  01/23/2004  Job:  [id number]

## 2010-10-01 NOTE — Op Note (Signed)
NAME:  [name] [name], [name] [name]                          ACCOUNT NO.:  [id number]   MEDICAL RECORD NO.:  [id number]                   PATIENT TYPE:  INP   LOCATION:  3032                                 FACILITY:  [facility]   PHYSICIAN:  Elizabeth Sauer, M.D.                   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   OPERATION PERFORMED:  C4-5 posterior augmentation and fusion.   SURGEON:  Elizabeth Sauer, M.D.   ANESTHESIA:  General endotracheal.   PREP:  Sterile Betadine prep and scrub with alcohol wipe.   COMPLICATIONS:  None.   NURSE ASSISTANT:  [place].   DOCTOR ASSISTANT:  Marchia Meiers. Vertell Limber, M.D.   INDICATIONS FOR PROCEDURE:  The patient is a 60 year old lady with anterior  cervical fusion that has nonunion.   DESCRIPTION OF PROCEDURE:  The patient was taken to the operating room,  smoothly anesthetized, intubated, and placed prone on the operating table.  Following shave, prep and drape in the usual sterile fashion.  Skin was  infiltrated with 1% lidocaine with 1:400,000 epinephrine.  Skin was incised  from the mid-C3 to mid-C6 and the lamina of C4 and C5 were exposed  bilaterally in the subperiosteal plane.  Intraoperative x-ray confirmed  correctness of the level.  Having confirmed correctness of level, lateral  mass screws were placed in C4 and C5 bilaterally, using standard landmarks.  Intraoperative x-ray showed good placement of screws.  They were connected  by the rods and capped and torqued.  DBX and bone matrix was then placed  over the decorticated  lamina and facet joints.  Prior to this, the wound  was irrigated and hemostasis assured.  The fascia was reapproximated with 0  Vicryl in interrupted fashion, subcutaneous tissues reapproximated with 0  Vicryl in interrupted fashion.  Subcuticular tissues was  reapproximated with  3-0 Vicryl in interrupted fashion.  Skin was closed with 3-0 nylon in  running locked fashion.  Betadine Telfa dressing applied and made occlusive  with Op-Site and patient returned to recovery room in good condition.                                               Elizabeth Sauer, M.D.    MWR/MEDQ  D:  01/23/2004  T:  01/24/2004  Job:  [id number]

## 2010-10-01 NOTE — Consult Note (Signed)
[name] [name], [name]                ACCOUNT NO.:  [id number]   MEDICAL RECORD NO.:  [id number]           PATIENT TYPE:  AMB   LOCATION:  DAY                           FACILITY:  [facility]   PHYSICIAN:  Caro Hight, M.D.      DATE OF BIRTH:  1951-03-27   DATE OF CONSULTATION:  02/07/2006  DATE OF DISCHARGE:                                   CONSULTATION   REASON FOR CONSULTATION:  Diarrhea, abdominal cramps, vomited blood.   PHYSICIAN REQUESTING CONSULTATION:  Dr. Jerene Bears, [facility].   CONSULTING PHYSICIAN:  Caro Hight, M.D.   HISTORY OF PRESENT ILLNESS:  [name] [name] is a 60 year old lady who presents for  further evaluation of above-stated symptoms.  She states her symptoms really  all began around the fall of 2006.  She states she walked in on her  stepfather who had been murdered.  Over the course of several weeks to  months afterwards she started having severe abdominal cramps with diarrhea.  This has been an ongoing problem for her throughout the last year.  She has  been admitted to the hospital at least on one occasion since then with what  she states was diverticulitis and colitis.  She states she had a  colonoscopy by Dr. Lindalou Hose as well as CTs.  She was treated with Levaquin  and Flagyl.  At the time, antibiotics seemed to help but she continues to  have ongoing diarrhea.  She was having upwards of 10 or more stools a day.  She was having some nocturnal diarrhea.  She has lost about 10 pounds.  No  recent rectal bleeding and no melena.  In August she did pass some fresh  blood.  Three weeks ago she had severe abdominal cramping diarrhea.  She  developed vomiting and with the first episode she vomited fresh blood.  She  went to the emergency department but was never seen after a 4-5 hour wait,  and she left.  She followed up with Dr. Woody Seller in his office, and it was  recommended that she have an upper endoscopy.  She denied any melena.  She  has been on Diclofenac for  about 6 months and previously was on aspirin 325  mg daily but recently was dropped to 81 mg daily.  Before all these symptoms  began, she was very constipated for the majority of her life.  She does  recall in 1994 with her back surgery she developed significant diarrhea  which she was told was related to NSAIDs and she was put on Questran.  She  denies any heartburn, dysphagia, odynophagia.  She was recently put on  Levbid and Carafate which seems to have helped her diarrhea and cramps.   MEDICATIONS:  1. Levbid one b.i.d. p.r.n.  2. Carafate 1 gram q.i.d.  3. Hydrocodone 10/325 mg q.i.d.  4. Diovan 80 mg daily.  5. Cardizem 180 mg daily.  6. Labetalol 200 mg daily.  7. Nexium 40 mg daily.  8. Prempro 2.5 mg daily.  9. Celexa 40 mg daily.  10.Red yeast rice 600 mg  daily.  11.Aspirin 81 mg daily.  12.Allegra 180 mg daily.  13.Lomotil 2.5 mg p.r.n.  14.Xanax 1 mg p.r.n.  15.Diclofenac 75 mg b.i.d.   ALLERGIES:  PENICILLIN, SULFA, ZESTRIL, LOPRESSOR, AND CARDURA.   PAST MEDICAL HISTORY:  She states she has been treated for diverticulitis at  least on 2 occasions, questionable colitis based on colonoscopy in December  2006, but we have not received records.  She has degenerative joint disease,  hypertension, history of CAD status post two coronary stents.  She has had  an appendectomy, tonsillectomy, neck surgery 6 times and lower back surgery  once.   FAMILY HISTORY:  Mother deceased at age 24, had Parkinson's disease but  according to the death certificate died due to diverticulitis.  Father died  of MI at age 55.  No family history of colorectal cancer.   SOCIAL HISTORY:  She is married with one child.  She is on disability.  She  does not smoke or drink alcohol.   REVIEW OF SYSTEMS:  See HPI for GI.  CONSTITUTIONAL:  No chest pain or  shortness of breath.   PHYSICAL EXAMINATION:  Weight 147.5, height 5 feet 3, temp 98.3, blood  pressure 118/64, pulse 74.  GENERAL:   Pleasant, well-nourished, well-developed, Caucasian female in no  acute distress.  SKIN:  Warm and dry, no jaundice.  HEENT:  Sclerae nonicteric.  Oropharyngeal mucosa moist and pink.  No  lesions, erythema or exudate.  No lymphadenopathy or thyromegaly.  CHEST:  Lungs are clear to auscultation.  CARDIAC:  Exam reveals a regular rate and rhythm, normal S1 S2, no murmurs,  rubs or gallops.  ABDOMEN:  Positive bowel sounds.  Abdomen is soft, nontender, nondistended,  no organomegaly or masses.  No rebound tenderness or guarding.  No abdominal  hernias.  EXTREMITIES:  No edema.   IMPRESSION:  [name] [name] is a 60 year old lady with a one-year history of daily  abdominal cramping and diarrhea.  She reports being diagnosed with  diverticulitis and colitis in December 2006 during a hospitalization.  We  are trying to retrieve those records.  She has had a 10-pound weight loss.  She developed hematemesis a few weeks ago.  She has been on nonsteroidal,  anti-inflammatory drugs in the way of Diclofenac and aspirin and is at risk  for peptic ulcer disease.  Cannot exclude the possibility of nonsteroidal,  anti-inflammatory drug enteropathy; however, she reports having diarrhea  long before she began Diclofenac.   PLAN:  1. Will proceed with EGD.  2. Retrieve records of recent colonoscopy and CT to help determine further      evaluation of her diarrhea.  3. Carafate 1 gram slurry q.a.c. and nightly, 2-week supply.  4. She will continue her Levbid and Nexium as before.  5. Limit NSAID use.      Neil Crouch, P.A.      Caro Hight, M.D.  Electronically Signed    LL/MEDQ  D:  02/07/2006  T:  02/08/2006  Job:  [id number]   cc:   Jerene Bears  Fax: [phone]

## 2010-10-25 ENCOUNTER — Other Ambulatory Visit (INDEPENDENT_AMBULATORY_CARE_PROVIDER_SITE_OTHER): Payer: Self-pay | Admitting: Internal Medicine

## 2010-10-25 DIAGNOSIS — R1032 Left lower quadrant pain: Secondary | ICD-10-CM

## 2010-10-25 DIAGNOSIS — R1012 Left upper quadrant pain: Secondary | ICD-10-CM

## 2010-10-25 DIAGNOSIS — R110 Nausea: Secondary | ICD-10-CM

## 2010-10-27 ENCOUNTER — Other Ambulatory Visit: Payer: Self-pay | Admitting: Cardiology

## 2010-10-28 ENCOUNTER — Ambulatory Visit (HOSPITAL_COMMUNITY): Admission: RE | Admit: 2010-10-28 | Payer: Medicare Other | Source: Ambulatory Visit

## 2010-11-01 ENCOUNTER — Emergency Department (HOSPITAL_COMMUNITY)
Admission: EM | Admit: 2010-11-01 | Discharge: 2010-11-01 | Disposition: A | Discharge status: Home / Self Care | Payer: Private Health Insurance - Indemnity | Attending: Emergency Medicine | Admitting: Emergency Medicine

## 2010-11-01 DIAGNOSIS — R109 Unspecified abdominal pain: Secondary | ICD-10-CM | POA: Insufficient documentation

## 2010-11-01 LAB — BASIC METABOLIC PANEL: Creatinine: 1.4 mg/dL — AB (ref 0.5–1.1)

## 2010-11-02 ENCOUNTER — Encounter (HOSPITAL_COMMUNITY): Payer: Self-pay

## 2010-11-02 ENCOUNTER — Ambulatory Visit (HOSPITAL_COMMUNITY)
Admission: RE | Admit: 2010-11-02 | Discharge: 2010-11-02 | Disposition: A | Discharge status: Home / Self Care | Payer: Private Health Insurance - Indemnity | Source: Ambulatory Visit | Attending: Internal Medicine | Admitting: Internal Medicine

## 2010-11-02 DIAGNOSIS — R109 Unspecified abdominal pain: Secondary | ICD-10-CM | POA: Insufficient documentation

## 2010-11-02 DIAGNOSIS — R1012 Left upper quadrant pain: Secondary | ICD-10-CM

## 2010-11-02 DIAGNOSIS — K5730 Diverticulosis of large intestine without perforation or abscess without bleeding: Secondary | ICD-10-CM | POA: Insufficient documentation

## 2010-11-02 DIAGNOSIS — I708 Atherosclerosis of other arteries: Secondary | ICD-10-CM | POA: Insufficient documentation

## 2010-11-02 DIAGNOSIS — R110 Nausea: Secondary | ICD-10-CM | POA: Insufficient documentation

## 2010-11-02 DIAGNOSIS — R1032 Left lower quadrant pain: Secondary | ICD-10-CM | POA: Insufficient documentation

## 2010-11-02 HISTORY — PX: OTHER SURGICAL HISTORY: SHX169

## 2010-11-02 MED ORDER — IOHEXOL 350 MG/ML SOLN
100.0000 mL | Freq: Once | INTRAVENOUS | Status: AC | PRN
  Administered 2010-11-02: 100 mL via INTRAVENOUS

## 2010-11-15 ENCOUNTER — Ambulatory Visit (INDEPENDENT_AMBULATORY_CARE_PROVIDER_SITE_OTHER): Payer: Medicare Other | Admitting: Internal Medicine

## 2010-11-28 ENCOUNTER — Other Ambulatory Visit: Payer: Self-pay | Admitting: Cardiology

## 2010-12-08 ENCOUNTER — Encounter (INDEPENDENT_AMBULATORY_CARE_PROVIDER_SITE_OTHER): Payer: Self-pay

## 2010-12-28 ENCOUNTER — Encounter (INDEPENDENT_AMBULATORY_CARE_PROVIDER_SITE_OTHER): Payer: Self-pay | Admitting: Internal Medicine

## 2010-12-28 ENCOUNTER — Ambulatory Visit (INDEPENDENT_AMBULATORY_CARE_PROVIDER_SITE_OTHER): Payer: Medicare Other | Admitting: Internal Medicine

## 2010-12-28 VITALS — BP 130/78 | HR 74 | Temp 98.4°F | Ht 63.0 in | Wt 124.0 lb

## 2010-12-28 DIAGNOSIS — K589 Irritable bowel syndrome without diarrhea: Secondary | ICD-10-CM

## 2010-12-28 NOTE — Patient Instructions (Addendum)
Can use glycerin suppository on as needed basis.

## 2010-12-29 NOTE — Progress Notes (Signed)
Presenting complaint; diarrhea and abdominal pain Subjective; patient is 60 year old Caucasian female who is here for scheduled visit. She was last seen on 09/21/2010. She has had problems with intractable diarrhea with extensive workup final diagnosis IBS. She she has been on Lotronex duration of 80 his other medications. Because of persistent symptoms she had CT angio abdomen and pelvis on 11/02/2010. Her SMA and celiac trunk are patent; however she had stool throughout her colon and some in her distal small bowel. Patient is advised to use fleets enema and she recalled that she had a good response. She states she is doing much better as far as diarrhea is concerned she is having anywhere from 2-3 stools per day ; she she has taken pictures of her stool but his cell phone camera; all the bowel movements that she has Should reveal that she has formed stool; one of these pictures show that she had large amounts of stool in the commode. She continues to complain of intermittent cramps which she describes as severe; past weekend she developed nausea vomiting and diarrhea she is already feeling better. Current medications; Reviewed with the patient.  See AVS Objective; BP 130/78  Pulse 74  Temp(Src) 98.4 F (36.9 C) (Oral)  Ht 5\' 3"  (1.6 m)  Wt 124 lb (56.246 kg)  BMI 21.97 kg/m2 Conjunctiva is pink. Sclerae anicteric. Oropharyngeal mucosa is normal. No neck masses or thyromegaly noted. Her abdomen is symmetrical bowel sounds are normal on palpation soft abdomen with mild tenderness and LLQ. No organomegaly or masses. No peripheral edema or clubbing noted. Assessment; 1. Irritable  bowel syndrome; in retrospect it would appear she was having spurious  Diarrhea. It appears more often in evacuation problem; she is taking too many antidiarrheals she needs to slowly come off these medications otherwise she will end up with fecal impaction. 2. abdominal pain; some of this pain appears to be part and  parcel of her IBS or severe cramps appear to be musculoskeletal. Recommendations Patient advised to stop taking Imodium Questran decreased to 4 g twice daily Dicyclomine decreased to 20 mg once daily Patient advised use glycerin suppository on when necessary basis. She needs to show that she has good in evacuation daily. Office visit in 3 months.

## 2011-02-14 ENCOUNTER — Telehealth: Payer: Self-pay | Admitting: Cardiology

## 2011-02-14 LAB — POCT I-STAT 3, ART BLOOD GAS (G3+)
TCO2: 30
pCO2 arterial: 45.7 — ABNORMAL HIGH
pH, Arterial: 7.401 — ABNORMAL HIGH
pO2, Arterial: 55 — ABNORMAL LOW

## 2011-02-14 LAB — BLOOD GAS, ARTERIAL
Bicarbonate: 26.7 — ABNORMAL HIGH
FIO2: 0.21
O2 Saturation: 91.8
TCO2: 28.2
pH, Arterial: 7.362
pO2, Arterial: 66.5 — ABNORMAL LOW

## 2011-02-14 LAB — POCT I-STAT 3, VENOUS BLOOD GAS (G3P V)
Acid-Base Excess: 3 — ABNORMAL HIGH
O2 Saturation: 58
pH, Ven: 7.382 — ABNORMAL HIGH
pO2, Ven: 33
pO2, Ven: 34

## 2011-02-14 NOTE — Telephone Encounter (Signed)
Spoke with patient.  Advised to go to ED for evaluation.  She verbalized understanding & will have husband take her.

## 2011-03-03 ENCOUNTER — Telehealth: Payer: Self-pay | Admitting: *Deleted

## 2011-03-03 NOTE — Telephone Encounter (Signed)
States she had to call EMS the other night for chest pain & elevated BP.  Before they arrived - BP was 186/105.  Did get up to 162/118 while EMS was there.  Stated they run EKG & said it loooked normal.  Stated finally after taking 5 NTG she got to feeling a little better.  Stated that EMS stayed there with her about an hour.  Stated they did not encourage her to go to ED for evaluation, told her to call her MD for appointment.  Scheduled OV with GD for Monday, 10/22.  Strongly encouraged her to go to ED for worsening symptoms.  She verbalized understanding.

## 2011-03-07 ENCOUNTER — Ambulatory Visit (INDEPENDENT_AMBULATORY_CARE_PROVIDER_SITE_OTHER): Payer: Medicare Other | Admitting: Cardiology

## 2011-03-07 ENCOUNTER — Encounter: Payer: Self-pay | Admitting: Cardiology

## 2011-03-07 VITALS — BP 124/83 | HR 76 | Ht 63.0 in | Wt 128.0 lb

## 2011-03-07 DIAGNOSIS — I509 Heart failure, unspecified: Secondary | ICD-10-CM

## 2011-03-07 DIAGNOSIS — I959 Hypotension, unspecified: Secondary | ICD-10-CM

## 2011-03-07 DIAGNOSIS — I5032 Chronic diastolic (congestive) heart failure: Secondary | ICD-10-CM

## 2011-03-07 DIAGNOSIS — I259 Chronic ischemic heart disease, unspecified: Secondary | ICD-10-CM

## 2011-03-07 DIAGNOSIS — R079 Chest pain, unspecified: Secondary | ICD-10-CM

## 2011-03-07 MED ORDER — NEBIVOLOL HCL 5 MG PO TABS: 2.5000 mg | ORAL_TABLET | ORAL | Status: DC

## 2011-03-07 MED ORDER — FUROSEMIDE 40 MG PO TABS: 20.0000 mg | ORAL_TABLET | ORAL | Status: DC

## 2011-03-07 MED ORDER — VALSARTAN 320 MG PO TABS: 160.0000 mg | ORAL_TABLET | Freq: Every day | ORAL | Status: DC

## 2011-03-07 NOTE — Patient Instructions (Signed)
   Decrease Bystolic to 2.5mg  every morning  Decrease Valsartan to 160mg  every evening  Decrease Lasix to 20mg  every other day Your physician wants you to follow up in: 6 months.  You will receive a reminder letter in the mail one-two months in advance.  If you don't receive a letter, please call our office to schedule the follow up appointment

## 2011-04-04 ENCOUNTER — Ambulatory Visit (INDEPENDENT_AMBULATORY_CARE_PROVIDER_SITE_OTHER): Payer: Medicare Other | Admitting: Internal Medicine

## 2011-04-05 ENCOUNTER — Encounter (INDEPENDENT_AMBULATORY_CARE_PROVIDER_SITE_OTHER): Payer: Self-pay | Admitting: Internal Medicine

## 2011-04-17 ENCOUNTER — Ambulatory Visit: Payer: Medicare Other | Admitting: Cardiology

## 2011-04-18 ENCOUNTER — Ambulatory Visit: Payer: Medicare Other | Admitting: Cardiology

## 2011-04-25 ENCOUNTER — Other Ambulatory Visit (INDEPENDENT_AMBULATORY_CARE_PROVIDER_SITE_OTHER): Payer: Self-pay | Admitting: Internal Medicine

## 2011-04-25 NOTE — Assessment & Plan Note (Addendum)
Decrease Vasartan and bystolic as per instructions.

## 2011-04-25 NOTE — Assessment & Plan Note (Signed)
No recuurent SCP- continue medical rx

## 2011-04-25 NOTE — Progress Notes (Signed)
[name] Real, MD, [facility] Board Certified in Adult Cardiovascular Medicine,Internal Medicine and Critical Care Medicine    CC: follow- up CAD  HPI:  the patient is a 60 year old female with a history of coronary artery disease she status post drug-eluting stent to the right coronary artery for instent restenosis. Unfortunately she has begun smoking again. She denies however substernal chest pain. She denies any orthopnea PND. She still continues to progress with occasional bowel cramps, bloating and diarrhea. She is under treatment for irritable bowel syndrome. She does complain of easy bruisability on full dose aspirin and Plavix. Otherwise from a cardiovascular perspective she is stable. In particular she denies any orthopnea PND palpitations or syncope. Shedoes report atypical CP, but not exertional. She continues to smoke.       PMH: reviewed and listed in Problem List in Electronic Records (and see below)  Allergies/SH/FHX : available in Electronic Records for review  Medications: Current Outpatient Prescriptions  Medication Sig Dispense Refill  . alosetron (LOTRONEX) 1 MG tablet Take 1 mg by mouth 2 (two) times daily.        Marland Kitchen aspirin 81 MG chewable tablet Chew 81 mg by mouth daily.        . butalbital-aspirin-caffeine (FIORINAL) 50-325-40 MG per capsule 2 capsules. 2 Capsules as needed for Headache      . calcium carbonate (TUMS - DOSED IN MG ELEMENTAL CALCIUM) 500 MG chewable tablet Chew 1 tablet by mouth as needed.        . Calcium Carbonate-Vitamin D (CALCIUM + D PO) Take by mouth daily.        . carisoprodol (SOMA) 350 MG tablet Take 350 mg by mouth as needed.       . cholestyramine (QUESTRAN) 4 G packet Take 1 packet by mouth 2 (two) times daily as needed.       . clopidogrel (PLAVIX) 75 MG tablet Take 75 mg by mouth daily.       . Coenzyme Q10 (CO Q-10) 100 MG CAPS Take by mouth daily.        . diazepam (VALIUM) 5 MG tablet Take 10 mg by mouth every 12 (twelve) hours  as needed.       . dicyclomine (BENTYL) 20 MG tablet Take 20 mg by mouth every 6 (six) hours.       . diphenhydrAMINE (BENADRYL) 25 mg capsule Take by mouth as needed.        . diphenoxylate-atropine (LOMOTIL) 2.5-0.025 MG per tablet Take 1 tablet by mouth 4 (four) times daily as needed.        . fexofenadine (ALLEGRA) 180 MG tablet Take 180 mg by mouth daily.       . furosemide (LASIX) 40 MG tablet Take 0.5 tablets (20 mg total) by mouth every other day.      . gabapentin (NEURONTIN) 600 MG tablet Take 600 mg by mouth 3 (three) times daily.       Marland Kitchen HYDROcodone-acetaminophen (NORCO) 10-325 MG per tablet Take 1 tablet by mouth every 6 (six) hours as needed.        . nebivolol (BYSTOLIC) 5 MG tablet Take 0.5 tablets (2.5 mg total) by mouth every morning.      . neomycin-bacitracin-polymyxin (NEOSPORIN) ointment Apply topically. Applies to affect area as needed       . NITROSTAT 0.4 MG SL tablet DISSOLVE ONE TABLET UNDER THE TONGUE EVERY 5 MINUTES AS NEEDED FOR CHEST PAIN.  DO NOT EXCEED A TOTAL OF 3 DOSES  IN 15 MINUTES  25 each  2  . Omega-3 Fatty Acids (OMEGA-3 2100 PO) Take 1 capsule by mouth daily. daily      . pantoprazole (PROTONIX) 40 MG tablet Take 40 mg by mouth daily.        . phenyltoloxamine-acetaminophen 50-500 MG per tablet Take 1 tablet by mouth as needed.       . pravastatin (PRAVACHOL) 40 MG tablet Take 1 tablet (40 mg total) by mouth daily. SEND FUTURE REQUEST TO PRIMARY CARE DOCTOR  90 tablet  0  . Probiotic Product (PROBIOTIC PO) Take by mouth daily.        . promethazine (PHENERGAN) 25 MG tablet 25 mg. As Needed      . Psyllium (METAMUCIL PO) Take by mouth as directed.       . sodium chloride (MURO 128) 5 % ophthalmic solution Place 2 drops into both eyes at bedtime.       . valsartan (DIOVAN) 320 MG tablet Take 0.5 tablets (160 mg total) by mouth at bedtime.      . Venlafaxine HCl 150 MG TB24 Take 1 tablet by mouth daily.       . vitamin E 1000 UNIT capsule Take 1,000 Units by  mouth daily.          ROS: No nausea or vomiting. No fever or chills.No melena or hematochezia.No bleeding.No claudication  Physical Exam: BP 124/83  Pulse 76  Ht 5\' 3"  (1.6 m)  Wt 128 lb (58.06 kg)  BMI 22.67 kg/m2 General: well- nourished. No distress HEENT: normal carotid upstroke. Normal JVP. No thyromegaly Cardiac: RRR, NL S1/S2. No pathologic murmurs Lungs: clear BS bilaterally, no wheezing. Abdomen, soft, non- tender Extremities. No edema. Normal distal pulses Skin: warm and dry Physologic ; normal affect.    12lead ECG: Limited bedside ECHO:N/A   Assessment and Plan

## 2011-04-25 NOTE — Assessment & Plan Note (Signed)
Decrease Lasix.

## 2011-04-26 ENCOUNTER — Other Ambulatory Visit: Payer: Self-pay | Admitting: Cardiology

## 2011-05-02 ENCOUNTER — Other Ambulatory Visit: Payer: Self-pay | Admitting: Cardiology

## 2011-05-02 DIAGNOSIS — Z79899 Other long term (current) drug therapy: Secondary | ICD-10-CM

## 2011-05-02 DIAGNOSIS — E785 Hyperlipidemia, unspecified: Secondary | ICD-10-CM

## 2011-05-02 NOTE — Telephone Encounter (Signed)
Patient informed that no up to date cholesterol/liver labs available and she needed to  Have them done January 2013. Patient verbalized understanding of plan.

## 2011-05-04 ENCOUNTER — Other Ambulatory Visit (INDEPENDENT_AMBULATORY_CARE_PROVIDER_SITE_OTHER): Payer: Self-pay | Admitting: Internal Medicine

## 2011-05-23 ENCOUNTER — Encounter (INDEPENDENT_AMBULATORY_CARE_PROVIDER_SITE_OTHER): Payer: Self-pay | Admitting: Internal Medicine

## 2011-05-23 ENCOUNTER — Ambulatory Visit (INDEPENDENT_AMBULATORY_CARE_PROVIDER_SITE_OTHER): Payer: Medicare Other | Admitting: Internal Medicine

## 2011-05-23 VITALS — BP 116/76 | HR 74 | Temp 97.8°F | Resp 14 | Ht 63.0 in | Wt 124.0 lb

## 2011-05-23 DIAGNOSIS — R109 Unspecified abdominal pain: Secondary | ICD-10-CM

## 2011-05-23 DIAGNOSIS — K589 Irritable bowel syndrome without diarrhea: Secondary | ICD-10-CM

## 2011-05-23 MED ORDER — DIPHENOXYLATE-ATROPINE 2.5-0.025 MG PO TABS: 1.0000 | ORAL_TABLET | Freq: Four times a day (QID) | ORAL | Status: DC | PRN

## 2011-05-23 MED ORDER — DIAZEPAM 5 MG PO TABS: 10.0000 mg | ORAL_TABLET | Freq: Two times a day (BID) | ORAL | Status: DC

## 2011-05-23 NOTE — Patient Instructions (Signed)
Please note Lotronex is being discontinued. Take dicyclomine on schedule and Lomotil only on as-needed basis. Do not exceed commended dose of diazepam.
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
## 2011-05-24 ENCOUNTER — Telehealth (INDEPENDENT_AMBULATORY_CARE_PROVIDER_SITE_OTHER): Payer: Self-pay | Admitting: *Deleted

## 2011-05-24 NOTE — Telephone Encounter (Signed)
Sonia Baller from [facility] call and would like for someone to return the call. Have a question about the # of pills to disburse on rx received. The return phone number is  [phone].

## 2011-05-24 NOTE — Telephone Encounter (Signed)
I spoke with [name] [name] at [facility] . The question is regarding the amount of pills being dispensed. The patient has been getting #120, Valium 5 mg 4 a day.  Dr. Laural Golden wrote the prescription for Valium 10 mg take 1 by mouth twice a day #60 with 2 refills. Also noted that this must last the patient 30 days.  I verified this with  Dr. Laural Golden  , and this was verbally given to [facility] as noted per Dr. Laural Golden.

## 2011-06-16 ENCOUNTER — Other Ambulatory Visit: Payer: Self-pay | Admitting: *Deleted

## 2011-06-16 MED ORDER — CLOPIDOGREL BISULFATE 75 MG PO TABS: 75.0000 mg | ORAL_TABLET | Freq: Every day | ORAL | Status: DC

## 2011-07-07 ENCOUNTER — Other Ambulatory Visit (INDEPENDENT_AMBULATORY_CARE_PROVIDER_SITE_OTHER): Payer: Self-pay | Admitting: *Deleted

## 2011-07-07 NOTE — Telephone Encounter (Signed)
Patient has requested a refill on Diphen/Atrop 2.5 mg - takes 1 tablet by mouth 4 times daily as needed for diarrhea or loose stools.

## 2011-07-07 NOTE — Telephone Encounter (Signed)
Patient left a message on voicemail stating the following: She has called [facility] to get a refill on her Lomotil. It was last filled #100 and the instructions were take 1 by mouth four (4) times daily for diarrhea. She is requesting the number that is dispensed be #120 so that it may last the whole month. She was taken off the Lotronex at her last office visit.

## 2011-07-07 NOTE — Telephone Encounter (Signed)
Received the refill request today, 07/07/11, and sent it to [facility].

## 2011-08-01 ENCOUNTER — Other Ambulatory Visit (INDEPENDENT_AMBULATORY_CARE_PROVIDER_SITE_OTHER): Payer: Self-pay | Admitting: *Deleted

## 2011-08-01 NOTE — Telephone Encounter (Signed)
[name] [name] has request a refill on Diphen/Atrop 205 mg, take 1 tablet by mouth 4 times daily as needed for diarrhea or loose stools.

## 2011-08-21 ENCOUNTER — Other Ambulatory Visit (INDEPENDENT_AMBULATORY_CARE_PROVIDER_SITE_OTHER): Payer: Self-pay | Admitting: Internal Medicine

## 2011-08-22 ENCOUNTER — Ambulatory Visit (INDEPENDENT_AMBULATORY_CARE_PROVIDER_SITE_OTHER): Payer: Medicare Other | Admitting: Internal Medicine

## 2011-08-22 ENCOUNTER — Encounter (INDEPENDENT_AMBULATORY_CARE_PROVIDER_SITE_OTHER): Payer: Self-pay | Admitting: Internal Medicine

## 2011-08-22 VITALS — BP 92/64 | HR 64 | Temp 98.4°F | Ht 62.0 in | Wt 132.8 lb

## 2011-08-22 DIAGNOSIS — Z853 Personal history of malignant neoplasm of breast: Secondary | ICD-10-CM | POA: Insufficient documentation

## 2011-08-22 DIAGNOSIS — R197 Diarrhea, unspecified: Secondary | ICD-10-CM

## 2011-08-22 DIAGNOSIS — K589 Irritable bowel syndrome without diarrhea: Secondary | ICD-10-CM

## 2011-08-22 MED ORDER — DIPHENOXYLATE-ATROPINE 2.5-0.025 MG PO TABS: 1.0000 | ORAL_TABLET | Freq: Four times a day (QID) | ORAL | Status: DC | PRN

## 2011-08-22 NOTE — Progress Notes (Signed)
Presenting complaint;  Followup for diarrhea. Subjective:  [name] [name] presents for 3 month followup visit. She states this has been a good year for her so far. On most days she has 2 formed stools. Every now and then she may skip a day and when she is upset nervous or panicky she has explosive diarrhea with as many as 6 stools per day. She denies melena or rectal bleeding she continues to have intermittent abdominal spasms. She has a good appetite. she has gained 9 pounds since her last visit. She is using Lomotil on when necessary basis but takes dicyclomine 20 mg every morning and second dose on as-needed basis.  Current Medications: Current Outpatient Prescriptions  Medication Sig Dispense Refill  . aspirin 81 MG chewable tablet Chew 81 mg by mouth daily.        Marland Kitchen azelastine (ASTELIN) 137 MCG/SPRAY nasal spray Place 1 spray into the nose 2 (two) times daily. Use in each nostril as directed      . b complex vitamins capsule Take 1 capsule by mouth daily.      . butalbital-aspirin-caffeine (FIORINAL) 50-325-40 MG per capsule 2 capsules. 2 Capsules as needed for Headache      . clopidogrel (PLAVIX) 75 MG tablet Take 1 tablet (75 mg total) by mouth daily.  90 tablet  3  . Cranberry 300 MG tablet Take 300 mg by mouth 2 (two) times daily.      . diazepam (VALIUM) 5 MG tablet Take 2 tablets (10 mg total) by mouth 2 (two) times daily.  60 tablet  2  . dicyclomine (BENTYL) 10 MG capsule TAKE 1 TO 2 CAPSULES THREE TIMES A DAY AS NEEDED  200 capsule  2  . diphenoxylate-atropine (LOMOTIL) 2.5-0.025 MG per tablet Take 1 tablet by mouth 4 (four) times daily as needed.  100 tablet  0  . fexofenadine (ALLEGRA) 180 MG tablet Take 180 mg by mouth daily.       . fluticasone (VERAMYST) 27.5 MCG/SPRAY nasal spray Place 2 sprays into the nose daily.      . furosemide (LASIX) 40 MG tablet Take 0.5 tablets (20 mg total) by mouth every other day.      . gabapentin (NEURONTIN) 600 MG tablet Take 600 mg by mouth 3 (three)  times daily.       Marland Kitchen loperamide (IMODIUM) 2 MG capsule Take 2 mg by mouth 4 (four) times daily as needed.      . nebivolol (BYSTOLIC) 5 MG tablet Take 0.5 tablets (2.5 mg total) by mouth every morning.      Marland Kitchen NITROSTAT 0.4 MG SL tablet DISSOLVE ONE TABLET UNDER THE TONGUE EVERY 5 MINUTES AS NEEDED FOR CHEST PAIN.  DO NOT EXCEED A TOTAL OF 3 DOSES IN 15 MINUTES  25 each  2  . nystatin 100000 UNITS vaginal tablet Place 1 tablet vaginally 4 (four) times daily.      Marland Kitchen oxyCODONE (OXYCONTIN) 20 MG 12 hr tablet Take 20 mg by mouth every 12 (twelve) hours.      . pantoprazole (PROTONIX) 40 MG tablet TAKE 1 TABLET EVERY MORNING  90 tablet  3  . promethazine (PHENERGAN) 25 MG tablet 25 mg. As Needed      . Psyllium (METAMUCIL PO) Take by mouth as directed.       Marland Kitchen tiZANidine (ZANAFLEX) 4 MG capsule Take 4 mg by mouth as needed.      . valsartan (DIOVAN) 320 MG tablet Take 0.5 tablets (160 mg total) by mouth  at bedtime.      . Venlafaxine HCl 150 MG TB24 Take 1 tablet by mouth daily.       . vitamin E 1000 UNIT capsule Take 1,000 Units by mouth daily.        . baclofen (LIORESAL) 10 MG tablet Take 10 mg by mouth 2 (two) times daily.        . calcium carbonate (TUMS - DOSED IN MG ELEMENTAL CALCIUM) 500 MG chewable tablet Chew 1 tablet by mouth as needed.        . Calcium Carbonate-Vitamin D (CALCIUM + D PO) Take by mouth daily.        . cholestyramine (QUESTRAN) 4 G packet Take 1 packet by mouth 2 (two) times daily as needed.       . diazepam (VALIUM) 5 MG tablet Take 10 mg by mouth every 12 (twelve) hours as needed.       . diphenhydrAMINE (BENADRYL) 25 mg capsule Take by mouth as needed.        . diphenoxylate-atropine (LOMOTIL) 2.5-0.025 MG per tablet Take 1 tablet by mouth 4 (four) times daily as needed for diarrhea or loose stools.  100 tablet  0  . neomycin-bacitracin-polymyxin (NEOSPORIN) ointment Apply topically. Applies to affect area as needed       . Omega-3 Fatty Acids (OMEGA-3 2100 PO) Take 1  capsule by mouth daily. daily      . pravastatin (PRAVACHOL) 40 MG tablet TAKE ONE TABLET BY MOUTH EVERY DAY  90 tablet  0  . Probiotic Product (PROBIOTIC PO) Take by mouth daily.        . sodium chloride (MURO 128) 5 % ophthalmic solution Place 2 drops into both eyes at bedtime.          Objective: Blood pressure 92/64, pulse 64, temperature 98.4 F (36.9 C), height 5\' 2"  (1.575 m), weight 132 lb 12.8 oz (60.238 kg). Patient appears to be in no acute distress. Conjunctiva is pink. Sclera is nonicteric Oropharyngeal mucosa is normal. No neck masses or thyromegaly noted. Abdomen is symmetrical. Bowel sounds are normal. On palpation soft abdomen without tenderness organomegaly or masses. No LE edema or clubbing noted.    Assessment:  Chronic diarrhea felt to be secondary to irritable bowel syndrome. She's had extensive workup within the last 2 years.  She is on multiple medications affecting colonic motility. For several months she has not had bouts of explosive copious diarrhea. She is at risk for constipation and or fecal impaction.   Plan:  New prescription given for diphenoxylate/atropine 4 times a day when necessary 100 with 2 refills to be filled nor did than 30 days each time. Patient must increase fiber intake in her diet. Office visit in 3 months.

## 2011-08-22 NOTE — Patient Instructions (Signed)
Please take Lomotil or diphenoxylate/atropine only on as-needed basis and no more than 4 tablets on any given day.

## 2011-09-09 ENCOUNTER — Ambulatory Visit (INDEPENDENT_AMBULATORY_CARE_PROVIDER_SITE_OTHER): Payer: Medicare Other | Admitting: Cardiology

## 2011-09-09 ENCOUNTER — Encounter: Payer: Self-pay | Admitting: Cardiology

## 2011-09-09 VITALS — BP 149/81 | HR 64 | Ht 62.0 in | Wt 135.5 lb

## 2011-09-09 DIAGNOSIS — I509 Heart failure, unspecified: Secondary | ICD-10-CM

## 2011-09-09 DIAGNOSIS — I2510 Atherosclerotic heart disease of native coronary artery without angina pectoris: Secondary | ICD-10-CM

## 2011-09-09 DIAGNOSIS — I5032 Chronic diastolic (congestive) heart failure: Secondary | ICD-10-CM

## 2011-09-09 DIAGNOSIS — K589 Irritable bowel syndrome without diarrhea: Secondary | ICD-10-CM

## 2011-09-09 DIAGNOSIS — I959 Hypotension, unspecified: Secondary | ICD-10-CM

## 2011-09-09 NOTE — Patient Instructions (Signed)
Continue all current medications. Your physician wants you to follow up in: 6 months.  You will receive a reminder letter in the mail one-two months in advance.  If you don't receive a letter, please call our office to schedule the follow up appointment   

## 2011-09-20 ENCOUNTER — Other Ambulatory Visit (INDEPENDENT_AMBULATORY_CARE_PROVIDER_SITE_OTHER): Payer: Self-pay | Admitting: Internal Medicine

## 2011-09-21 ENCOUNTER — Telehealth (INDEPENDENT_AMBULATORY_CARE_PROVIDER_SITE_OTHER): Payer: Self-pay | Admitting: Internal Medicine

## 2011-09-21 NOTE — Telephone Encounter (Signed)
Rx called in the [facility] for dicyclomine 10mg  TID # 90.

## 2011-09-21 NOTE — Telephone Encounter (Signed)
Rx for Dicyclomine called in to pharmacy.

## 2011-09-25 ENCOUNTER — Encounter: Payer: Self-pay | Admitting: Cardiology

## 2011-09-25 DIAGNOSIS — I2510 Atherosclerotic heart disease of native coronary artery without angina pectoris: Secondary | ICD-10-CM | POA: Insufficient documentation

## 2011-09-25 NOTE — Progress Notes (Signed)
[name] Real, MD, [facility] Board Certified in Adult Cardiovascular Medicine,Internal Medicine and Critical Care Medicine    CC: Followup patient with a history of coronary artery disease  HPI:The patient is doing well from a cardiac standpoint.  She has no significant recurrent chest pain.  She status post drug-eluting stent to the right coronary artery and in-stent restenosisOn DAPT.  She did report an episode of chest pain last night for which she took 2 sublingual nitroglycerin.  However it felt more like indigestion.  She reports no exertional symptoms and has had no other episodes of substernal chest pain reminiscent of her prior symptoms prior to stent placement.  She has no palpitations.  The patient started Lasix but this has led to some dehydration with low blood pressures.  She has no pitting edema but rather has edema secondary to venous insufficiency.  Fortunately she reports no presyncope or syncope.   PMH: reviewed and listed in Problem List in Electronic Records (and see below) Past Medical History  Diagnosis Date  . GERD (gastroesophageal reflux disease)   . Abdominal cramping 09/21/2010  . Chronic diarrhea   . Coronary artery disease     Status post drug eluding stent to the right coronary artery status post in-stent restenosis.  DAPT   Past Surgical History  Procedure Date  . Mastectomy 05/27/2008    RIGHT  . 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 Small bowel givens 01/06/2009  . Ct angiography 11/02/2010    ABD &PELV    Allergies/SH/FHX : available in Electronic Records for review  Allergies  Allergen Reactions  . Amlodipine Besylate   . Doxazosin Mesylate   . Lisinopril     REACTION: causes hives  . Metoprolol Tartrate     REACTION: causes hives  . Penicillins   . Prednisone     Patient states that it caused bad abdominal pain   History   Social History  . Marital Status: Married    Spouse Name: N/A    Number of Children: N/A  . Years of Education: N/A    Occupational History  . Not on file.   Social History Main Topics  . Smoking status: Current Everyday Smoker -- 1.0 packs/day for 45 years    Types: Cigarettes  . Smokeless tobacco: Never Used   Comment: has quit several times but restarted  . Alcohol Use: No  . Drug Use: No  . Sexually Active: Not on file   Other Topics Concern  . Not on file   Social History Narrative  . No narrative on file   Family History  Problem Relation Age of Onset  . Diverticulitis Mother   . Heart disease Father   . Heart disease Brother   . Crohn's disease Brother     Medications: Current Outpatient Prescriptions  Medication Sig Dispense Refill  . aspirin 81 MG chewable tablet Chew 81 mg by mouth daily.        Marland Kitchen azelastine (ASTELIN) 137 MCG/SPRAY nasal spray Place 1 spray into the nose 2 (two) times daily. Use in each nostril as directed      . b complex vitamins capsule Take 1 capsule by mouth daily.      . calcium carbonate (TUMS - DOSED IN MG ELEMENTAL CALCIUM) 500 MG chewable tablet Chew 1 tablet by mouth as needed.        . cetirizine (ZYRTEC) 10 MG tablet Take 10 mg by mouth daily as needed.      . cholestyramine (QUESTRAN) 4  G packet Take 1 packet by mouth 2 (two) times daily as needed.       . clopidogrel (PLAVIX) 75 MG tablet Take 1 tablet (75 mg total) by mouth daily.  90 tablet  3  . Cranberry 300 MG tablet Take 300 mg by mouth 2 (two) times daily.      . diazepam (VALIUM) 5 MG tablet Take 5 mg by mouth. Take 1 tablet every morning and 2 tablets at night as needed.      . diphenoxylate-atropine (LOMOTIL) 2.5-0.025 MG per tablet Take 1 tablet by mouth 4 (four) times daily as needed.  100 tablet  2  . fexofenadine (ALLEGRA) 180 MG tablet Take 180 mg by mouth daily as needed.       . fluticasone (VERAMYST) 27.5 MCG/SPRAY nasal spray Place 2 sprays into the nose daily.      . furosemide (LASIX) 40 MG tablet Take 0.5 tablets (20 mg total) by mouth every other day.      . gabapentin  (NEURONTIN) 600 MG tablet Take 600 mg by mouth 3 (three) times daily.       Marland Kitchen loperamide (IMODIUM) 2 MG capsule Take 2 mg by mouth 4 (four) times daily as needed.      . nebivolol (BYSTOLIC) 5 MG tablet Take 0.5 tablets (2.5 mg total) by mouth every morning.      Marland Kitchen NITROSTAT 0.4 MG SL tablet DISSOLVE ONE TABLET UNDER THE TONGUE EVERY 5 MINUTES AS NEEDED FOR CHEST PAIN.  DO NOT EXCEED A TOTAL OF 3 DOSES IN 15 MINUTES  25 each  2  . Omega-3 Fatty Acids (OMEGA-3 2100 PO) Take 1 capsule by mouth daily. daily      . oxyCODONE (OXYCONTIN) 20 MG 12 hr tablet Take 20 mg by mouth every 12 (twelve) hours.      . pantoprazole (PROTONIX) 40 MG tablet TAKE 1 TABLET EVERY MORNING  90 tablet  3  . pravastatin (PRAVACHOL) 40 MG tablet TAKE ONE TABLET BY MOUTH EVERY DAY  90 tablet  0  . Probiotic Product (PROBIOTIC PO) Take by mouth daily.        . promethazine (PHENERGAN) 25 MG tablet 25 mg. As Needed      . Psyllium (METAMUCIL PO) Take by mouth as directed.       . sodium chloride (MURO 128) 5 % ophthalmic solution Place 2 drops into both eyes at bedtime.       . valsartan (DIOVAN) 320 MG tablet Take 0.5 tablets (160 mg total) by mouth at bedtime.      . Venlafaxine HCl 150 MG TB24 Take 1 tablet by mouth daily.       . vitamin E 1000 UNIT capsule Take 1,000 Units by mouth daily.        Marland Kitchen dicyclomine (BENTYL) 10 MG capsule TAKE 1 TO 2 CAPSULES THREE TIMES A DAY AS NEEDED  200 capsule  1    ROS: No nausea or vomiting. No fever or chills.No melena or hematochezia.No bleeding.No claudication  Physical Exam: BP 149/81  Pulse 64  Ht 5\' 2"  (1.575 m)  Wt 135 lb 8 oz (61.462 kg)  BMI 24.78 kg/m2 General:Well-nourished white female in no distress. Neck:Normal carotid upstroke and no carotid bruit.  No thyromegaly no nodular thyroid. Lungs:Clear breath sounds bilaterally.  No wheezing Cardiac:.Regular rate and rhythm with normal S1 and S2 and no murmur rubs or gallops Vascular:Nonpitting edema secondary to  varicose veins.  Dorsalis pedis and posterior tibial pulses are  diminished. Skin:Warm and dry Physcologic:Normal affect  12lead ECG:Not performed during this visit Limited bedside ECHO:N/A No images are attached to the encounter.   Assessment and Plan  IRRITABLE BOWEL SYNDROME IBS mixed, improved.  Patient seen by gastroenterology  CHRONIC DIASTOLIC HEART FAILURE No evidence of volume overload.  Patient does have some mild edema related to venous insufficiency and I recommended compression stockings.The patient did start using Lasix but I told her to hold this regimen as she has no heart failure findings and her swelling is Rather secondary to nonpitting edema from varicose veins.  CAD No recurrent chest pain.  Status post drug-eluting stent and in-stent restenosis the patient will remain on DAPT.  She did use nitroglycerin but reports no exertional symptoms.  HYPOTENSION Low blood pressure in the office today but asymptomatic.  Repeat readings are within normal limits.  Overall her blood pressure remained stable.  I do suspect that excess Lasix contributed to her drop in blood pressure.  This has been discontinued.    Patient Active Problem List  Diagnoses  . DYSLIPIDEMIA  . TOBACCO ABUSE  . ESSENTIAL HYPERTENSION, BENIGN  . CAD  . CORONARY ARTERY DISEASE, S/P PTCA  . CHRONIC DIASTOLIC HEART FAILURE  . HYPOTENSION  . COPD  . IRRITABLE BOWEL SYNDROME  . HX: breast cancer  . Coronary artery disease

## 2011-09-25 NOTE — Assessment & Plan Note (Signed)
Low blood pressure in the office today but asymptomatic.  Repeat readings are within normal limits.  Overall her blood pressure remained stable.  I do suspect that excess Lasix contributed to her drop in blood pressure.  This has been discontinued.

## 2011-09-25 NOTE — Assessment & Plan Note (Signed)
IBS mixed, improved.  Patient seen by gastroenterology

## 2011-09-25 NOTE — Assessment & Plan Note (Addendum)
No evidence of volume overload.  Patient does have some mild edema related to venous insufficiency and I recommended compression stockings.The patient did start using Lasix but I told her to hold this regimen as she has no heart failure findings and her swelling is Rather secondary to nonpitting edema from varicose veins.

## 2011-09-25 NOTE — Assessment & Plan Note (Signed)
No recurrent chest pain.  Status post drug-eluting stent and in-stent restenosis the patient will remain on DAPT.  She did use nitroglycerin but reports no exertional symptoms.

## 2011-10-21 ENCOUNTER — Ambulatory Visit: Payer: Private Health Insurance - Indemnity | Admitting: Cardiology

## 2011-10-24 ENCOUNTER — Encounter: Payer: Private Health Insurance - Indemnity | Admitting: Internal Medicine

## 2011-11-21 ENCOUNTER — Ambulatory Visit (INDEPENDENT_AMBULATORY_CARE_PROVIDER_SITE_OTHER): Payer: Private Health Insurance - Indemnity | Admitting: Internal Medicine

## 2011-11-30 ENCOUNTER — Encounter (INDEPENDENT_AMBULATORY_CARE_PROVIDER_SITE_OTHER): Payer: Self-pay | Admitting: *Deleted

## 2011-12-20 ENCOUNTER — Ambulatory Visit (INDEPENDENT_AMBULATORY_CARE_PROVIDER_SITE_OTHER): Payer: Private Health Insurance - Indemnity | Admitting: Internal Medicine

## 2011-12-20 ENCOUNTER — Encounter (INDEPENDENT_AMBULATORY_CARE_PROVIDER_SITE_OTHER): Payer: Self-pay | Admitting: Internal Medicine

## 2011-12-20 VITALS — BP 130/72 | HR 68 | Temp 97.3°F | Resp 20 | Ht 62.0 in | Wt 130.3 lb

## 2011-12-20 DIAGNOSIS — K589 Irritable bowel syndrome without diarrhea: Secondary | ICD-10-CM

## 2011-12-20 DIAGNOSIS — R197 Diarrhea, unspecified: Secondary | ICD-10-CM

## 2011-12-20 DIAGNOSIS — K219 Gastro-esophageal reflux disease without esophagitis: Secondary | ICD-10-CM

## 2011-12-20 MED ORDER — HYOSCYAMINE SULFATE 0.125 MG SL SUBL: 0.1250 mg | SUBLINGUAL_TABLET | Freq: Four times a day (QID) | SUBLINGUAL | Status: DC | PRN

## 2011-12-20 MED ORDER — DIPHENOXYLATE-ATROPINE 2.5-0.025 MG PO TABS: 1.0000 | ORAL_TABLET | Freq: Four times a day (QID) | ORAL | Status: DC | PRN

## 2011-12-20 MED ORDER — PANTOPRAZOLE SODIUM 40 MG PO TBEC: 40.0000 mg | DELAYED_RELEASE_TABLET | Freq: Two times a day (BID) | ORAL | Status: DC

## 2011-12-20 NOTE — Progress Notes (Signed)
Presenting complaint;  Followup for chronic diarrhea. Patient complains of frequent heartburn.  Subjective:  Patient is 61 year old Caucasian female who is here for scheduled visit. She was last seen 4 months ago. She states last month was the best month since she's had problems with diarrhea and abdominal pain. Most days she has had one or 2 normal stools. She believes that mediation along with forgiving her son has helped her. He still has intermittent abdominal cramps. She had abdominopelvic CT by Dr. Woody Seller  in June and it showed diverticulosis. She denies rectal bleeding. She she has urgency at least once a week. She says her appetite is up and down. She says she is having severe heartburn but denies nausea and vomiting. She is watching her diet. She eats [place] with fiber every morning and she also eats oatmeal with fiber daily. Her weight is stable.  Current Medications: Current Outpatient Prescriptions  Medication Sig Dispense Refill  . aspirin 81 MG chewable tablet Chew 81 mg by mouth daily.        Marland Kitchen azelastine (ASTELIN) 137 MCG/SPRAY nasal spray Place 1 spray into the nose 2 (two) times daily. Use in each nostril as directed      . b complex vitamins capsule Take 1 capsule by mouth daily.      . calcium carbonate (TUMS - DOSED IN MG ELEMENTAL CALCIUM) 500 MG chewable tablet Chew 1 tablet by mouth as needed.        . cetirizine (ZYRTEC) 10 MG tablet Take 10 mg by mouth daily as needed.      . cholestyramine (QUESTRAN) 4 G packet Take 1 packet by mouth 2 (two) times daily as needed.       . clopidogrel (PLAVIX) 75 MG tablet Take 1 tablet (75 mg total) by mouth daily.  90 tablet  3  . Cranberry 300 MG tablet Take 300 mg by mouth 2 (two) times daily.      . diazepam (VALIUM) 5 MG tablet Take 5 mg by mouth. Take 1 tablet every morning and 2 tablets at night as needed.      . dicyclomine (BENTYL) 10 MG capsule TAKE 1 TO 2 CAPSULES THREE TIMES A DAY AS NEEDED  200 capsule  1  .  diphenoxylate-atropine (LOMOTIL) 2.5-0.025 MG per tablet Take 1 tablet by mouth 4 (four) times daily as needed.  100 tablet  2  . fexofenadine (ALLEGRA) 180 MG tablet Take 180 mg by mouth daily as needed.       . fluticasone (VERAMYST) 27.5 MCG/SPRAY nasal spray Place 2 sprays into the nose daily.      . furosemide (LASIX) 40 MG tablet Take 0.5 tablets (20 mg total) by mouth every other day.      . gabapentin (NEURONTIN) 600 MG tablet Take 600 mg by mouth 3 (three) times daily.       Marland Kitchen loperamide (IMODIUM) 2 MG capsule Take 2 mg by mouth 4 (four) times daily as needed.      . nebivolol (BYSTOLIC) 5 MG tablet Take 0.5 tablets (2.5 mg total) by mouth every morning.      Marland Kitchen NITROSTAT 0.4 MG SL tablet DISSOLVE ONE TABLET UNDER THE TONGUE EVERY 5 MINUTES AS NEEDED FOR CHEST PAIN.  DO NOT EXCEED A TOTAL OF 3 DOSES IN 15 MINUTES  25 each  2  . Omega-3 Fatty Acids (OMEGA-3 2100 PO) Take 1 capsule by mouth daily. daily      . oxyCODONE (OXYCONTIN) 20 MG 12 hr tablet  Take 20 mg by mouth every 12 (twelve) hours.      . pantoprazole (PROTONIX) 40 MG tablet TAKE 1 TABLET EVERY MORNING  90 tablet  3  . pravastatin (PRAVACHOL) 40 MG tablet TAKE ONE TABLET BY MOUTH EVERY DAY  90 tablet  0  . Probiotic Product (PROBIOTIC PO) Take by mouth daily.        . promethazine (PHENERGAN) 25 MG tablet 25 mg. As Needed      . Psyllium (METAMUCIL PO) Take by mouth as directed.       . sodium chloride (MURO 128) 5 % ophthalmic solution Place 2 drops into both eyes at bedtime.       . valsartan (DIOVAN) 320 MG tablet Take 0.5 tablets (160 mg total) by mouth at bedtime.      . Venlafaxine HCl 150 MG TB24 Take 1 tablet by mouth daily.       . vitamin E 1000 UNIT capsule Take 1,000 Units by mouth daily.           Objective: BP 130/72  Pulse 68  Temp 97.3 F (36.3 C) (Oral)  Resp 20  Ht 5\' 2"  (1.575 m)  Wt 130 lb 4.8 oz (59.104 kg)  BMI 23.83 kg/m2 Patient is alert and in no acute distress. Conjunctiva is pink. Sclera  is nonicteric Oropharyngeal mucosa is normal. No neck masses or thyromegaly noted. Abdomen. Bowel sounds are normal. Abdomen is soft and nontender without organomegaly or masses the  No LE edema or clubbing noted.  Labs/studies Results: Abdominopelvic CT and she'll films reviewed with the patient. The done on 11/02/2011. 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 Common origin of celiac trunk and SMA and patent IMA. Has sigmoid diverticulosis.   Assessment:  #1. Chronic diarrhea. She has undergone extensive workup has been negative and final diagnoses is IBS. At times it appears she has spurious diarrhea. Patient is on polytherapy and at risk for fecal impaction. She needs to make sure that she has one good bowel movement at least every other day. #2. GERD. Sometimes poorly controlled with single dose PPI. #3. Intermittent abdominal pain felt to be muscle pain rather than intestinal spasms.   Plan:  New prescription for Lomotil given. Pantoprazole increased to 40 mg by mouth twice a day. Patient advised to was Dulcolax suppository or an enema if she goes days without a bowel movement. Patient advised not to take Imodium while she's on Lomotil and use Levsin or Bentyl only when absolutely necessary. Office visit in 6 months.

## 2011-12-20 NOTE — Patient Instructions (Addendum)
Make sure that you have a bowel movement every day otherwise you can use Dulcolax suppository or Fleet enema. Pantoprazole dose changed to 40 mg twice daily.

## 2012-01-19 ENCOUNTER — Other Ambulatory Visit: Payer: Self-pay | Admitting: Cardiology

## 2012-01-30 ENCOUNTER — Encounter: Payer: Self-pay | Admitting: Vascular Surgery

## 2012-01-31 ENCOUNTER — Encounter: Payer: Self-pay | Admitting: Vascular Surgery

## 2012-01-31 ENCOUNTER — Ambulatory Visit (INDEPENDENT_AMBULATORY_CARE_PROVIDER_SITE_OTHER): Payer: Private Health Insurance - Indemnity | Admitting: Vascular Surgery

## 2012-01-31 VITALS — BP 185/96 | HR 71 | Resp 16 | Ht 63.0 in | Wt 130.1 lb

## 2012-01-31 DIAGNOSIS — I714 Abdominal aortic aneurysm, without rupture, unspecified: Secondary | ICD-10-CM | POA: Insufficient documentation

## 2012-01-31 NOTE — Progress Notes (Signed)
[facility] [place]   Patient name: [name] [name] MRN: [id number] DOB: Dec 28, 1950 Sex: female   Referred by: Dr Glenna Fellows  Reason for referral:  Chief Complaint  Patient presents with  . New Evaluation    referral from Dr. Elizabeth Sauer  . AAA    HISTORY OF PRESENT ILLNESS: Patient has today for discussion of her small infrarenal abdominal aortic aneurysm. This had been seen approximately one year ago with a CT angiogram. She had a recent repeat CT which did show some progression in size of this. She is here today for discussion of her aneurysm. She has no symptoms referable to it. She does have a family history of a brother with an aneurysm. His extensive past medical history to include coronary artery disease with prior stenting. She denies any history of peripheral vascular occlusive disease  Past Medical History  Diagnosis Date  . GERD (gastroesophageal reflux disease)   . Abdominal cramping 09/21/2010  . Chronic diarrhea   . Coronary artery disease     Status post drug eluding stent to the right coronary artery status post in-stent restenosis.  DAPT  . AAA (abdominal aortic aneurysm)   . Cancer 2010    right breast   mastectomy and cheotherapy    Past Surgical History  Procedure Date  . Mastectomy 05/27/2008    RIGHT  . 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 Small bowel givens 01/06/2009  . Ct angiography 11/02/2010    ABD &PELV  . Coronary angioplasty with stent placement 2000-2010    3 cardiac stent splaced per patient  . Appendectomy 1995    History   Social History  . Marital Status: Married    Spouse Name: N/A    Number of Children: N/A  . Years of Education: N/A   Occupational History  . Not on file.   Social History Main Topics  . Smoking status: Current Every Day Smoker -- 1.0 packs/day for 45 years    Types: Cigarettes  . Smokeless tobacco: Never Used   Comment: has quit several times but restarted  . Alcohol Use: No  . Drug Use: No  .  Sexually Active: Not on file   Other Topics Concern  . Not on file   Social History Narrative  . No narrative on file    Family History  Problem Relation Age of Onset  . Diverticulitis Mother   . Heart disease Father   . Heart disease Brother   . Crohn's disease Brother     Allergies as of 01/31/2012 - Review Complete 01/31/2012  Allergen Reaction Noted  . Amlodipine besylate  03/09/2008  . Doxazosin mesylate  03/09/2008  . Lisinopril  03/09/2008  . Metoprolol tartrate  03/09/2008  . Penicillins  03/09/2008  . Prednisone  12/28/2010    Current Outpatient Prescriptions on File Prior to Visit  Medication Sig Dispense Refill  . alosetron (LOTRONEX) 1 MG tablet Take 1 mg by mouth 2 (two) times daily.      Marland Kitchen azelastine (ASTELIN) 137 MCG/SPRAY nasal spray Place 1 spray into the nose 2 (two) times daily. Use in each nostril as directed      . b complex vitamins capsule Take 1 capsule by mouth daily.      . butalbital-aspirin-caffeine (FIORINAL) 50-325-40 MG per capsule 1 capsule as needed.       . calcium carbonate (TUMS - DOSED IN MG ELEMENTAL CALCIUM) 500 MG chewable tablet Chew 1 tablet by mouth as needed.        Marland Kitchen  Calcium Carbonate-Vitamin D (CALCIUM 600-D PO) Take by mouth daily.      . cetirizine (ZYRTEC) 10 MG tablet Take 10 mg by mouth daily as needed.      . clopidogrel (PLAVIX) 75 MG tablet Take 1 tablet (75 mg total) by mouth daily.  90 tablet  3  . diazepam (VALIUM) 5 MG tablet Take 5 mg by mouth. Take 1 tablet every morning and 2 tablets at night as needed.      . dicyclomine (BENTYL) 10 MG capsule TAKE 1 TO 2 CAPSULES THREE TIMES A DAY AS NEEDED  200 capsule  1  . diphenoxylate-atropine (LOMOTIL) 2.5-0.025 MG per tablet Take 1 tablet by mouth 4 (four) times daily as needed.  100 tablet  1  . fluticasone (FLONASE) 50 MCG/ACT nasal spray Place 2 sprays into the nose as needed.       . furosemide (LASIX) 40 MG tablet Take 0.5 tablets (20 mg total) by mouth every other  day.      . gabapentin (NEURONTIN) 600 MG tablet Take 600 mg by mouth 3 (three) times daily.       . hyoscyamine (LEVSIN SL) 0.125 MG SL tablet Take 1 tablet (0.125 mg total) by mouth every 6 (six) hours as needed for cramping. Patient states that she takes 1-2 a day  60 tablet  0  . ketorolac (ACULAR) 0.4 % SOLN 1 drop 2 (two) times daily. 1 drop to both eyes daily      . nebivolol (BYSTOLIC) 5 MG tablet Take 0.5 tablets (2.5 mg total) by mouth every morning.      Marland Kitchen NITROSTAT 0.4 MG SL tablet DISSOLVE ONE TABLET UNDER THE TONGUE EVERY 5 MINUTES AS NEEDED FOR CHEST PAIN.  DO NOT EXCEED A TOTAL OF 3 DOSES IN 15 MINUTES  25 each  2  . Omega-3 Fatty Acids (OMEGA-3 2100 PO) Take 1 capsule by mouth daily. daily      . oxyCODONE (OXYCONTIN) 20 MG 12 hr tablet Take 20 mg by mouth every 12 (twelve) hours.      . pantoprazole (PROTONIX) 40 MG tablet Take 1 tablet (40 mg total) by mouth 2 (two) times daily.  180 tablet  1  . pravastatin (PRAVACHOL) 40 MG tablet TAKE ONE TABLET BY MOUTH EVERY DAY  90 tablet  0  . Probiotic Product (PROBIOTIC PO) Take by mouth daily.        . promethazine (PHENERGAN) 25 MG tablet 25 mg. As Needed      . valsartan (DIOVAN) 320 MG tablet Take 0.5 tablets (160 mg total) by mouth at bedtime.      . Venlafaxine HCl 150 MG TB24 Take 1 tablet by mouth daily.       . vitamin E 1000 UNIT capsule Take 1,000 Units by mouth daily.       Marland Kitchen aspirin 81 MG chewable tablet Chew 81 mg by mouth daily.        Marland Kitchen gentamicin (GARAMYCIN) 0.3 % ophthalmic solution Place 2 drops into both eyes 2 (two) times daily.          REVIEW OF SYSTEMS:  Positives indicated with an "X"  CARDIOVASCULAR:  [x ] chest pain   [ ]  chest pressure   [x ] palpitations   [ ]  orthopnea   [ ]  dyspnea on exertion   [ ]  claudication   [ ]  rest pain   [ ]  DVT   [ ]  phlebitis PULMONARY:   [ ]  productive cough   [ ]   asthma   [ ]  wheezing NEUROLOGIC:   [x ] weakness  [ ]  paresthesias  [ ]  aphasia  [ ]  amaurosis  [ ]   dizziness HEMATOLOGIC:   [ ]  bleeding problems   [ ]  clotting disorders MUSCULOSKELETAL:  [ ]  joint pain   [ ]  joint swelling GASTROINTESTINAL: [ ]   blood in stool  [ ]   hematemesis GENITOURINARY:  [ ]   dysuria  [ ]   hematuria PSYCHIATRIC:  [ ]  history of major depression INTEGUMENTARY:  [x ] rashes  [ ]  ulcers CONSTITUTIONAL:  [ ]  fever   [ ]  chills  PHYSICAL EXAMINATION:  General: The patient is a well-nourished female, in no acute distress. Vital signs are BP 185/96  Pulse 71  Resp 16  Ht 5\' 3"  (1.6 m)  Wt 130 lb 1.6 oz (59.013 kg)  BMI 23.05 kg/m2 Pulmonary: There is a good air exchange bilaterally without wheezing or rales. Abdomen: Soft and non-tender with normal pitch bowel sounds. I do not palpate an aneurysm.  Musculoskeletal: There are no major deformities.  There is no significant extremity pain. Neurologic: No focal weakness or paresthesias are detected, Skin: There are no ulcer or rashes noted. Psychiatric: The patient has normal affect. Cardiovascular: There is a regular rate and rhythm without significant murmur appreciated. Pulse status: 2+ radial 2+ femoral 2+ popliteal 2+ posterior tibial pulses bilaterally with no evidence of peripheral artery aneurysm   Impression and Plan:  Small abdominal aortic aneurysm. I reviewed her prior CT scan which I have the films available from June 2012. I have the report from her recent CT scan. She does have a small approximately 3 cm aneurysm and her native aorta below the level renal is approximately 2 cm. I think this is quite small would have essentially 0 risk for rupture. I explained other than blood pressure control we would recommend her to continue her usual activities without limitations. We will see her again in one year with ultrasound followup to rule out any progression. I did explain symptoms of leaking aneurysm to her explained this would be highly unlikely. She does report immediately to emerge from should this occur.  We'll plan to see her again in one year    Zaliah Wissner [facility] [place] Office: [phone]

## 2012-02-01 ENCOUNTER — Other Ambulatory Visit: Payer: Self-pay | Admitting: *Deleted

## 2012-02-01 DIAGNOSIS — I714 Abdominal aortic aneurysm, without rupture: Secondary | ICD-10-CM

## 2012-03-14 ENCOUNTER — Ambulatory Visit: Payer: Private Health Insurance - Indemnity | Admitting: Cardiology

## 2012-04-02 ENCOUNTER — Encounter (INDEPENDENT_AMBULATORY_CARE_PROVIDER_SITE_OTHER): Payer: Self-pay

## 2012-05-02 ENCOUNTER — Telehealth (INDEPENDENT_AMBULATORY_CARE_PROVIDER_SITE_OTHER): Payer: Self-pay | Admitting: *Deleted

## 2012-05-02 NOTE — Telephone Encounter (Signed)
Ms.Basham called and is requesting a refill on her Dicyclomine. States that for about 2 months she has had horrible stomach cramps. She is also requesting an increase in the mg of the medication 10 mg to 20 mg. Per Dr.Rehman may call in Dicyclomine 20 mg take 1 by mouth 3 (three) times a day #90 no refills - the patient is to decrease to 10 mg when she starts to feel better This prescription was called to the [facility] Patient was called and made aware.

## 2012-05-10 ENCOUNTER — Other Ambulatory Visit: Payer: Self-pay | Admitting: Cardiology

## 2012-05-10 MED ORDER — CLOPIDOGREL BISULFATE 75 MG PO TABS: 75.0000 mg | ORAL_TABLET | Freq: Every day | ORAL | Status: DC

## 2012-05-18 DIAGNOSIS — B370 Candidal stomatitis: Secondary | ICD-10-CM

## 2012-05-18 DIAGNOSIS — Z170 Estrogen receptor positive status [ER+]: Secondary | ICD-10-CM

## 2012-05-18 DIAGNOSIS — C50919 Malignant neoplasm of unspecified site of unspecified female breast: Secondary | ICD-10-CM

## 2012-06-08 ENCOUNTER — Other Ambulatory Visit (INDEPENDENT_AMBULATORY_CARE_PROVIDER_SITE_OTHER): Payer: Self-pay | Admitting: Internal Medicine

## 2012-06-13 ENCOUNTER — Other Ambulatory Visit (INDEPENDENT_AMBULATORY_CARE_PROVIDER_SITE_OTHER): Payer: Self-pay | Admitting: Internal Medicine

## 2012-06-25 ENCOUNTER — Ambulatory Visit (INDEPENDENT_AMBULATORY_CARE_PROVIDER_SITE_OTHER): Payer: Private Health Insurance - Indemnity | Admitting: Internal Medicine

## 2012-07-04 ENCOUNTER — Encounter (HOSPITAL_COMMUNITY): Payer: Self-pay | Admitting: Pharmacy Technician

## 2012-07-11 ENCOUNTER — Encounter (HOSPITAL_COMMUNITY): Payer: Self-pay

## 2012-07-11 ENCOUNTER — Encounter (HOSPITAL_COMMUNITY)
Admission: RE | Admit: 2012-07-11 | Discharge: 2012-07-11 | Disposition: A | Discharge status: Home / Self Care | Payer: Managed Care, Other (non HMO) | Source: Ambulatory Visit | Attending: Ophthalmology | Admitting: Ophthalmology

## 2012-07-11 ENCOUNTER — Other Ambulatory Visit: Payer: Self-pay

## 2012-07-11 HISTORY — DX: Abdominal aortic aneurysm, without rupture: I71.4

## 2012-07-11 HISTORY — DX: Hyperlipidemia, unspecified: E78.5

## 2012-07-11 HISTORY — DX: Diverticulosis of intestine, part unspecified, without perforation or abscess without bleeding: K57.90

## 2012-07-11 HISTORY — DX: Major depressive disorder, single episode, unspecified: F32.9

## 2012-07-11 HISTORY — DX: Anxiety disorder, unspecified: F41.9

## 2012-07-11 HISTORY — DX: Abdominal aortic aneurysm, without rupture, unspecified: I71.40

## 2012-07-11 HISTORY — DX: Irritable bowel syndrome, unspecified: K58.9

## 2012-07-11 HISTORY — DX: Depression, unspecified: F32.A

## 2012-07-11 HISTORY — DX: Essential (primary) hypertension: I10

## 2012-07-11 HISTORY — DX: Unspecified osteoarthritis, unspecified site: M19.90

## 2012-07-11 LAB — BASIC METABOLIC PANEL
Calcium: 10.1 mg/dL (ref 8.4–10.5)
GFR calc Af Amer: 82 mL/min — ABNORMAL LOW (ref >90)
GFR calc non Af Amer: 71 mL/min — ABNORMAL LOW (ref >90)
Potassium: 3.1 mEq/L — ABNORMAL LOW (ref 3.5–5.1)
Sodium: 139 mEq/L (ref 135–145)

## 2012-07-11 NOTE — Patient Instructions (Signed)
[name] [name]  07/11/2012   Your procedure is scheduled on:  07/19/12  Report to [facility] at 12:00 PM.  Call this number if you have problems the morning of surgery: [phone]   Remember:   Do not eat food or drink liquids after midnight.   Take these medicines the morning of surgery with A SIP OF WATER: Dicyclomine, Diovan (if needed), Bystolic and Protonix. Take your Diazepam, Gabapentin, and Oxycodone only if needed.   Do not wear jewelry, make-up or nail polish.  Do not wear lotions, powders, or perfumes. You may wear deodorant.  Do not shave 48 hours prior to surgery. Men may shave face and neck.  Do not bring valuables to the hospital.  Contacts, dentures or bridgework may not be worn into surgery.  Leave suitcase in the car. After surgery it may be brought to your room.  For patients admitted to the hospital, checkout time is 11:00 AM the day of  discharge.   Patients discharged the day of surgery will not be allowed to drive  home.  Special Instructions: Start using your eye drops as prescribed by your eye doctor.   Please read over the following fact sheets that you were given: Pain Booklet and Anesthesia Post-op Instructions    Cataract Surgery  A cataract is a clouding of the lens of the eye. When a lens becomes cloudy, vision is reduced based on the degree and nature of the clouding. Surgery may be needed to improve vision. Surgery removes the cloudy lens and usually replaces it with a substitute lens (intraocular lens, IOL). LET YOUR EYE DOCTOR KNOW ABOUT:  Allergies to food or medicine.  Medicines taken including herbs, eyedrops, over-the-counter medicines, and creams.  Use of steroids (by mouth or creams).  Previous problems with anesthetics or numbing medicine.  History of bleeding problems or blood clots.  Previous surgery.  Other health problems, including diabetes and kidney problems.  Possibility of pregnancy, if this applies. RISKS AND  COMPLICATIONS  Infection.  Inflammation of the eyeball (endophthalmitis) that can spread to both eyes (sympathetic ophthalmia).  Poor wound healing.  If an IOL is inserted, it can later fall out of proper position. This is very uncommon.  Clouding of the part of your eye that holds an IOL in place. This is called an "after-cataract." These are uncommon, but easily treated. BEFORE THE PROCEDURE  Do not eat or drink anything except small amounts of water for 8 to 12 before your surgery, or as directed by your caregiver.  Unless you are told otherwise, continue any eyedrops you have been prescribed.  Talk to your primary caregiver about all other medicines that you take (both prescription and non-prescription). In some cases, you may need to stop or change medicines near the time of your surgery. This is most important if you are taking blood-thinning medicine.Do not stop medicines unless you are told to do so.  Arrange for someone to drive you to and from the procedure.  Do not put contact lenses in either eye on the day of your surgery. PROCEDURE There is more than one method for safely removing a cataract. Your doctor can explain the differences and help determine which is best for you. Phacoemulsification surgery is the most common form of cataract surgery.  An injection is given behind the eye or eyedrops are given to make this a painless procedure.  A small cut (incision) is made on the edge of the clear, dome-shaped surface that covers the front  of the eye (cornea).  A tiny probe is painlessly inserted into the eye. This device gives off ultrasound waves that soften and break up the cloudy center of the lens. This makes it easier for the cloudy lens to be removed by suction.  An IOL may be implanted.  The normal lens of the eye is covered by a clear capsule. Part of that capsule is intentionally left in the eye to support the IOL.  Your surgeon may or may not use stitches to  close the incision. There are other forms of cataract surgery that require a larger incision and stiches to close the eye. This approach is taken in cases where the doctor feels that the cataract cannot be easily removed using phacoemulsification. AFTER THE PROCEDURE  When an IOL is implanted, it does not need care. It becomes a permanent part of your eye and cannot be seen or felt.  Your doctor will schedule follow-up exams to check on your progress.  Review your other medicines with your doctor to see which can be resumed after surgery.  Use eyedrops or take medicine as prescribed by your doctor. Document Released: 04/21/2011 Document Revised: 07/25/2011 Document Reviewed: 04/21/2011 [facility] Patient Information 2013 [place].    PATIENT INSTRUCTIONS POST-ANESTHESIA  IMMEDIATELY FOLLOWING SURGERY:  Do not drive or operate machinery for the first twenty four hours after surgery.  Do not make any important decisions for twenty four hours after surgery or while taking narcotic pain medications or sedatives.  If you develop intractable nausea and vomiting or a severe headache please notify your doctor immediately.  FOLLOW-UP:  Please make an appointment with your surgeon as instructed. You do not need to follow up with anesthesia unless specifically instructed to do so.  WOUND CARE INSTRUCTIONS (if applicable):  Keep a dry clean dressing on the anesthesia/puncture wound site if there is drainage.  Once the wound has quit draining you may leave it open to air.  Generally you should leave the bandage intact for twenty four hours unless there is drainage.  If the epidural site drains for more than 36-48 hours please call the anesthesia department.  QUESTIONS?:  Please feel free to call your physician or the hospital operator if you have any questions, and they will be happy to assist you.

## 2012-07-18 MED ORDER — LIDOCAINE HCL (PF) 1 % IJ SOLN
INTRAMUSCULAR | Status: AC
  Filled 2012-07-18: qty 2

## 2012-07-18 MED ORDER — NEOMYCIN-POLYMYXIN-DEXAMETH 3.5-10000-0.1 OP OINT
TOPICAL_OINTMENT | OPHTHALMIC | Status: AC
  Filled 2012-07-18: qty 3.5

## 2012-07-18 MED ORDER — TETRACAINE HCL 0.5 % OP SOLN
OPHTHALMIC | Status: AC
  Filled 2012-07-18: qty 2

## 2012-07-18 MED ORDER — LIDOCAINE HCL 3.5 % OP GEL
OPHTHALMIC | Status: AC
  Filled 2012-07-18: qty 5

## 2012-07-18 MED ORDER — CYCLOPENTOLATE-PHENYLEPHRINE 0.2-1 % OP SOLN
OPHTHALMIC | Status: AC
  Filled 2012-07-18: qty 2

## 2012-07-18 MED ORDER — PHENYLEPHRINE HCL 2.5 % OP SOLN
OPHTHALMIC | Status: AC
  Filled 2012-07-18: qty 2

## 2012-07-19 ENCOUNTER — Encounter (HOSPITAL_COMMUNITY): Payer: Self-pay | Admitting: *Deleted

## 2012-07-19 ENCOUNTER — Encounter (HOSPITAL_COMMUNITY): Payer: Self-pay | Admitting: Anesthesiology

## 2012-07-19 ENCOUNTER — Encounter (HOSPITAL_COMMUNITY)
Admission: RE | Disposition: A | Discharge status: Home / Self Care | Payer: Self-pay | Source: Ambulatory Visit | Attending: Ophthalmology

## 2012-07-19 ENCOUNTER — Ambulatory Visit (HOSPITAL_COMMUNITY)
Admission: RE | Admit: 2012-07-19 | Discharge: 2012-07-19 | Disposition: A | Discharge status: Home / Self Care | Payer: Managed Care, Other (non HMO) | Source: Ambulatory Visit | Attending: Ophthalmology | Admitting: Ophthalmology

## 2012-07-19 ENCOUNTER — Ambulatory Visit (HOSPITAL_COMMUNITY): Payer: Managed Care, Other (non HMO) | Admitting: Anesthesiology

## 2012-07-19 DIAGNOSIS — Z79899 Other long term (current) drug therapy: Secondary | ICD-10-CM | POA: Insufficient documentation

## 2012-07-19 DIAGNOSIS — J449 Chronic obstructive pulmonary disease, unspecified: Secondary | ICD-10-CM | POA: Insufficient documentation

## 2012-07-19 DIAGNOSIS — I10 Essential (primary) hypertension: Secondary | ICD-10-CM | POA: Insufficient documentation

## 2012-07-19 DIAGNOSIS — H2589 Other age-related cataract: Secondary | ICD-10-CM | POA: Insufficient documentation

## 2012-07-19 DIAGNOSIS — J4489 Other specified chronic obstructive pulmonary disease: Secondary | ICD-10-CM | POA: Insufficient documentation

## 2012-07-19 HISTORY — PX: CATARACT EXTRACTION W/PHACO: SHX586

## 2012-07-19 SURGERY — PHACOEMULSIFICATION, CATARACT, WITH IOL INSERTION
Anesthesia: Monitor Anesthesia Care | Site: Eye | Laterality: Left | Wound class: Clean

## 2012-07-19 MED ORDER — CYCLOPENTOLATE-PHENYLEPHRINE 0.2-1 % OP SOLN
1.0000 [drp] | OPHTHALMIC | Status: AC
  Administered 2012-07-19 (×2): 1 [drp] via OPHTHALMIC

## 2012-07-19 MED ORDER — LIDOCAINE 3.5 % OP GEL OPTIME - NO CHARGE
OPHTHALMIC | Status: DC | PRN
  Administered 2012-07-19: 1 [drp] via OPHTHALMIC

## 2012-07-19 MED ORDER — EPINEPHRINE HCL 1 MG/ML IJ SOLN
INTRAMUSCULAR | Status: AC
  Filled 2012-07-19: qty 1

## 2012-07-19 MED ORDER — BSS IO SOLN
INTRAOCULAR | Status: DC | PRN
  Administered 2012-07-19: 15 mL via INTRAOCULAR

## 2012-07-19 MED ORDER — TETRACAINE HCL 0.5 % OP SOLN
1.0000 [drp] | OPHTHALMIC | Status: AC
  Administered 2012-07-19 (×2): 1 [drp] via OPHTHALMIC

## 2012-07-19 MED ORDER — PHENYLEPHRINE HCL 2.5 % OP SOLN
1.0000 [drp] | OPHTHALMIC | Status: AC
  Administered 2012-07-19 (×2): 1 [drp] via OPHTHALMIC

## 2012-07-19 MED ORDER — POVIDONE-IODINE 5 % OP SOLN
OPHTHALMIC | Status: DC | PRN
  Administered 2012-07-19: 1 via OPHTHALMIC

## 2012-07-19 MED ORDER — MIDAZOLAM HCL 2 MG/2ML IJ SOLN
1.0000 mg | INTRAMUSCULAR | Status: DC | PRN
  Administered 2012-07-19: 2 mg via INTRAVENOUS

## 2012-07-19 MED ORDER — LACTATED RINGERS IV SOLN
INTRAVENOUS | Status: DC
  Administered 2012-07-19: 13:00:00 via INTRAVENOUS

## 2012-07-19 MED ORDER — LIDOCAINE HCL 3.5 % OP GEL
1.0000 "application " | Freq: Once | OPHTHALMIC | Status: AC
  Administered 2012-07-19: 1 via OPHTHALMIC

## 2012-07-19 MED ORDER — LACTATED RINGERS IV SOLN
INTRAVENOUS | Status: DC | PRN
  Administered 2012-07-19: 13:00:00 via INTRAVENOUS

## 2012-07-19 MED ORDER — EPINEPHRINE HCL 1 MG/ML IJ SOLN
INTRAOCULAR | Status: DC | PRN
  Administered 2012-07-19: 13:00:00

## 2012-07-19 MED ORDER — MIDAZOLAM HCL 2 MG/2ML IJ SOLN
INTRAMUSCULAR | Status: AC
  Filled 2012-07-19: qty 2

## 2012-07-19 MED ORDER — LIDOCAINE HCL (PF) 1 % IJ SOLN
INTRAMUSCULAR | Status: DC | PRN
  Administered 2012-07-19: .5 mL

## 2012-07-19 MED ORDER — NEOMYCIN-POLYMYXIN-DEXAMETH 0.1 % OP OINT
TOPICAL_OINTMENT | OPHTHALMIC | Status: DC | PRN
  Administered 2012-07-19: 1 via OPHTHALMIC

## 2012-07-19 MED ORDER — PROVISC 10 MG/ML IO SOLN
INTRAOCULAR | Status: DC | PRN
  Administered 2012-07-19: 8.5 mg via INTRAOCULAR

## 2012-07-19 SURGICAL SUPPLY — 32 items

## 2012-07-19 NOTE — Transfer of Care (Signed)
Immediate Anesthesia Transfer of Care Note  Patient: [name] [name]  Procedure(s) Performed: Procedure(s) with comments: CATARACT EXTRACTION PHACO AND INTRAOCULAR LENS PLACEMENT (IOC) (Left) - CDE:9.95  Patient Location: Short Stay  Anesthesia Type:MAC  Level of Consciousness: awake, alert , oriented and patient cooperative  Airway & Oxygen Therapy: Patient Spontanous Breathing  Post-op Assessment: Report given to PACU RN and Post -op Vital signs reviewed and stable  Post vital signs: Reviewed and stable  Complications: No apparent anesthesia complications

## 2012-07-19 NOTE — Anesthesia Procedure Notes (Signed)
Procedure Name: MAC Performed by: Antony Contras, AMY L Pre-anesthesia Checklist: Patient identified, Patient being monitored, Emergency Drugs available, Timeout performed and Suction available Oxygen Delivery Method: Nasal cannula

## 2012-07-19 NOTE — Brief Op Note (Signed)
Pre-Op Dx: Cataract OS Post-Op Dx: Cataract OS Surgeon: Hunt, Kerry Anesthesia: Topical with MAC Surgery: Cataract Extraction with Intraocular lens Implant OS Implant: B&L enVista Specimen: None Complications: None 

## 2012-07-19 NOTE — Anesthesia Preprocedure Evaluation (Signed)
Anesthesia Evaluation  Patient identified by MRN, date of birth, ID band Patient awake    Reviewed: Allergy & Precautions, H&P , NPO status , Patient's Chart, lab work & pertinent test results  Airway Mallampati: II      Dental  (+) Teeth Intact   Pulmonary COPD breath sounds clear to auscultation        Cardiovascular hypertension, Pt. on medications + CAD and + Peripheral Vascular Disease (AAA) Rhythm:Regular Rate:Normal     Neuro/Psych PSYCHIATRIC DISORDERS Anxiety Depression    GI/Hepatic GERD-  ,  Endo/Other    Renal/GU      Musculoskeletal   Abdominal   Peds  (+) Delivery details - Hematology   Anesthesia Other Findings   Reproductive/Obstetrics                           Anesthesia Physical Anesthesia Plan  ASA: III  Anesthesia Plan: MAC   Post-op Pain Management:    Induction: Intravenous  Airway Management Planned: Nasal Cannula  Additional Equipment:   Intra-op Plan:   Post-operative Plan:   Informed Consent: I have reviewed the patients History and Physical, chart, labs and discussed the procedure including the risks, benefits and alternatives for the proposed anesthesia with the patient or authorized representative who has indicated his/her understanding and acceptance.     Plan Discussed with:   Anesthesia Plan Comments:         Anesthesia Quick Evaluation

## 2012-07-19 NOTE — H&P (Signed)
I have reviewed the H&P, the patient was re-examined, and I have identified no interval changes in medical condition and plan of care since the history and physical of record  

## 2012-07-19 NOTE — Preoperative (Signed)
Beta Blockers   Reason not to administer Beta Blockers:Not Applicable 

## 2012-07-19 NOTE — Anesthesia Postprocedure Evaluation (Signed)
  Anesthesia Post-op Note  Patient: [name] [name]  Procedure(s) Performed: Procedure(s) with comments: CATARACT EXTRACTION PHACO AND INTRAOCULAR LENS PLACEMENT (IOC) (Left) - CDE:9.95  Patient Location: Short Stay  Anesthesia Type:MAC  Level of Consciousness: awake, alert , oriented and patient cooperative  Airway and Oxygen Therapy: Patient Spontanous Breathing  Post-op Pain: none  Post-op Assessment: Post-op Vital signs reviewed, Patient's Cardiovascular Status Stable, Respiratory Function Stable, Patent Airway and No signs of Nausea or vomiting  Post-op Vital Signs: Reviewed and stable  Complications: No apparent anesthesia complications

## 2012-07-20 NOTE — Op Note (Signed)
[name] [name], [name] [name]                ACCOUNT NO.:  [id number]  MEDICAL RECORD NO.:  [id number]  LOCATION:  [facility]                          FACILITY:  [facility]  PHYSICIAN:  Richardo Hanks, MD       DATE OF BIRTH:  Jul 14, 1950  DATE OF PROCEDURE:  07/19/2012 DATE OF DISCHARGE:  07/19/2012                              OPERATIVE REPORT   PREOPERATIVE DIAGNOSIS:  Combined cataract, left eye, diagnosis code 366.19.  POSTOPERATIVE DIAGNOSIS:  Combined cataract, left eye, diagnosis code 366.19.  PROCEDURE PERFORMED:  Phacoemulsification, posterior chamber intraocular lens implantation, left eye.  SURGEON:  Richardo Hanks, MD  ANESTHESIA:  Topical with monitored anesthesia care and IV sedation.  OPERATIVE SUMMARY:  In the preoperative area, dilating drops were placed into the left eye.  The patient was then brought into the operating room where she was placed under general anesthesia.  The eye was then prepped and draped.  Beginning with a 75 blade, a paracentesis port was made at the surgeon's 2 o'clock position.  The anterior chamber was then filled with a 1% nonpreserved lidocaine solution with epinephrine.  This was followed by Viscoat to deepen the chamber.  A small fornix-based peritomy was performed superiorly.  Next, a single iris hook was placed through the limbus superiorly.  A 2.4-mm keratome blade was then used to make a clear corneal incision over the iris hook.  A bent cystotome needle and Utrata forceps were used to create a continuous tear capsulotomy.  Hydrodissection was performed using balanced salt solution on a fine cannula.  The lens nucleus was then removed using phacoemulsification in a quadrant cracking technique.  The cortical material was then removed with irrigation and aspiration.  The capsular bag and anterior chamber were refilled with Provisc.  The wound was widened to approximately 3 mm and a posterior chamber intraocular lens was placed into the capsular bag  without difficulty using an [employer] lens injecting system.  A single 10-0 nylon suture was then used to close the incision as well as stromal hydration.  The Provisc was removed from the anterior chamber and capsular bag with irrigation and aspiration.  At this point, the wounds were tested for leak, which were negative.  The anterior chamber remained deep and stable.  The patient tolerated the procedure well.  There were no operative complications, and she awoke from general anesthesia without problem.  No surgical specimens.  Prosthetic device used is a [employer] Lomb posterior chamber lens, model #MX60, power of 20.5, serial number is [id number].          ______________________________ Richardo Hanks, MD     KEH/MEDQ  D:  07/19/2012  T:  07/20/2012  Job:  [id number]

## 2012-07-23 LAB — POCT I-STAT 4, (NA,K, GLUC, HGB,HCT)
Glucose, Bld: 95 mg/dL (ref 70–99)
HCT: 43 % (ref 36.0–46.0)
Hemoglobin: 14.6 g/dL (ref 12.0–15.0)
Potassium: 3.6 mEq/L (ref 3.5–5.1)
Sodium: 142 mEq/L (ref 135–145)

## 2012-07-24 ENCOUNTER — Encounter (HOSPITAL_COMMUNITY): Payer: Self-pay | Admitting: Ophthalmology

## 2012-08-14 ENCOUNTER — Encounter (INDEPENDENT_AMBULATORY_CARE_PROVIDER_SITE_OTHER): Payer: Self-pay | Admitting: Internal Medicine

## 2012-08-14 ENCOUNTER — Ambulatory Visit (INDEPENDENT_AMBULATORY_CARE_PROVIDER_SITE_OTHER): Payer: Managed Care, Other (non HMO) | Admitting: Internal Medicine

## 2012-08-14 VITALS — BP 130/74 | HR 68 | Temp 97.6°F | Resp 18 | Ht 62.0 in | Wt 141.6 lb

## 2012-08-14 DIAGNOSIS — K589 Irritable bowel syndrome without diarrhea: Secondary | ICD-10-CM

## 2012-08-14 DIAGNOSIS — R109 Unspecified abdominal pain: Secondary | ICD-10-CM

## 2012-08-14 MED ORDER — ONDANSETRON HCL 4 MG PO TABS: 4.0000 mg | ORAL_TABLET | Freq: Two times a day (BID) | ORAL | Status: DC | PRN

## 2012-08-14 MED ORDER — PANTOPRAZOLE SODIUM 40 MG PO TBEC: 40.0000 mg | DELAYED_RELEASE_TABLET | Freq: Every day | ORAL | Status: DC

## 2012-08-14 MED ORDER — DICYCLOMINE HCL 20 MG PO TABS: 20.0000 mg | ORAL_TABLET | Freq: Two times a day (BID) | ORAL | Status: DC

## 2012-08-14 NOTE — Progress Notes (Signed)
Presenting complaint;  Followup for abdominal pain and IBS.  Subjective:  Patient is 62 year old Caucasian female with multiple medical problems who is here for scheduled visit. She was last seen 8 months ago. She says she continues to improve as far as her GI symptomatology is concerned. She is having less diarrhea or constipation days. She still complains of excruciating abdominal cramps which occur every other day if not daily and relieved with pain medication. Abdominal cramps usually not associated with diarrhea nausea or vomiting. She has good appetite. She has gained 11 pounds since her last visit. On most days she has 2-3 formed stools. Every now and then she may skip one or 2 days. And on days when she has diarrhea she can have as many as 7-8 stools. She is not having urgency and or accidents like she was having before. She has occasional hematochezia. She has noted that she has diarrhea every time she is upset or stressed out. She had left cataract extraction in February 2014. About 6 weeks ago she was admitted to [facility] were chest pain but cardiac evaluation was negative. She takes Lomotil occasionally.  Current Medications: Current Outpatient Prescriptions  Medication Sig Dispense Refill  . aspirin 81 MG chewable tablet Chew 81 mg by mouth daily.        Marland Kitchen b complex vitamins capsule Take 1 capsule by mouth daily.      . butalbital-aspirin-caffeine (FIORINAL) 50-325-40 MG per capsule Take 2 capsules by mouth daily as needed for headache.       . calcium carbonate (TUMS - DOSED IN MG ELEMENTAL CALCIUM) 500 MG chewable tablet Chew 1 tablet by mouth as needed for heartburn.       . Calcium Carbonate-Vitamin D (CALCIUM 600-D PO) Take 1 tablet by mouth daily.       . carisoprodol (SOMA) 350 MG tablet Take 350 mg by mouth 3 (three) times daily as needed for muscle spasms.      . clopidogrel (PLAVIX) 75 MG tablet Take 1 tablet (75 mg total) by mouth daily.  90 tablet  0  . Cyanocobalamin  (VITAMIN B-12 SL) Place 5,000 mcg under the tongue daily.      . diazepam (VALIUM) 10 MG tablet Take 10 mg by mouth 2 (two) times daily.      Marland Kitchen dicyclomine (BENTYL) 20 MG tablet Take 20 mg by mouth 3 (three) times daily as needed (cramps).      . diphenoxylate-atropine (LOMOTIL) 2.5-0.025 MG per tablet Take 1 tablet by mouth 4 (four) times daily as needed for diarrhea or loose stools.      . DUREZOL 0.05 % EMUL Place 3 drops into the left eye daily.       . fluticasone (FLONASE) 50 MCG/ACT nasal spray Place 2 sprays into the nose as needed for allergies.       . furosemide (LASIX) 40 MG tablet Take 20 mg by mouth daily as needed (swelling).      . gabapentin (NEURONTIN) 600 MG tablet Take 600 mg by mouth 3 (three) times daily.       Marland Kitchen ipratropium (ATROVENT) 0.03 % nasal spray Place 2 sprays into the nose every 12 (twelve) hours.      Marland Kitchen ketorolac (ACULAR) 0.4 % SOLN 1 drop 2 (two) times daily. 1 drop to both eyes daily      . ketotifen (ZADITOR) 0.025 % ophthalmic solution Place 1 drop into both eyes 2 (two) times daily.      Marland Kitchen levocetirizine (XYZAL)  5 MG tablet Take 5 mg by mouth every evening.      . nebivolol (BYSTOLIC) 5 MG tablet Take 0.5 tablets (2.5 mg total) by mouth every morning.      Marland Kitchen NITROSTAT 0.4 MG SL tablet DISSOLVE ONE TABLET UNDER THE TONGUE EVERY 5 MINUTES AS NEEDED FOR CHEST PAIN.  DO NOT EXCEED A TOTAL OF 3 DOSES IN 15 MINUTES  25 each  2  . nystatin (MYCOSTATIN) 100000 UNIT/ML suspension Take 500,000 Units by mouth as needed.       . Omega-3 Fatty Acids (OMEGA-3 2100 PO) Take 1 capsule by mouth daily.       Marland Kitchen oxyCODONE (OXYCONTIN) 20 MG 12 hr tablet Take 20 mg by mouth every 6 (six) hours as needed for pain (or cramps).       . pantoprazole (PROTONIX) 40 MG tablet TAKE 1 TABLET TWICE A DAY  180 tablet  0  . pravastatin (PRAVACHOL) 40 MG tablet Take 40 mg by mouth daily.       . Probiotic Product (PROBIOTIC PO) Take 1 capsule by mouth daily.       Marland Kitchen PROLENSA 0.07 % SOLN 1  drop. ! Drop in both eyes daily      . promethazine (PHENERGAN) 25 MG tablet Take 25 mg by mouth every 6 (six) hours as needed for nausea.       . valsartan (DIOVAN) 160 MG tablet Take 160 mg by mouth daily as needed (Only take if BP is >140.).      Marland Kitchen Venlafaxine HCl 150 MG TB24 Take 1 tablet by mouth daily.       . vitamin E 1000 UNIT capsule Take 1,000 Units by mouth daily.       Marland Kitchen zinc gluconate 50 MG tablet Take 50 mg by mouth daily.       No current facility-administered medications for this visit.     Objective: Blood pressure 130/74, pulse 68, temperature 97.6 F (36.4 C), temperature source Oral, resp. rate 18, height 5\' 2"  (1.575 m), weight 141 lb 9.6 oz (64.229 kg). Patient is alert and in no acute distress. Conjunctiva is pink. Sclera is nonicteric Oropharyngeal mucosa is normal. No neck masses or thyromegaly noted. Cardiac exam with regular rhythm normal S1 and S2. No murmur or gallop noted. Lungs are clear to auscultation. Abdomen is symmetrical. Bowel sounds are hyperactive. Abdomen is soft, nontender and without organomegaly or masses.  No LE edema or clubbing noted.   Assessment:  #1. Irritable bowel syndrome. She is doing better. She is not having multiple loose stools are urgency or accidents like she was before. It is reassuring to know that she has gained 11 pounds. Patient is not having any side effects to dicyclomine. She has to be watchful and make sure she does not end up with fecal impaction. #2. Abdominal cramps. Suspect abdominal cramps musculoskeletal and not of GI origin. #3. GERD. Patient is on double dose PPI and Plavix. #4. High-risk medication. Patient is on promethazine when necessary. She is at risk for significant side effects.   Plan:  Discontinue promethazine. Use ondansetron 4 mg by mouth 2 times a day when necessary. New prescription given for dicyclomine 20 mg by mouth twice a day. Decrease pantoprazole to 40 mg by mouth every morning and  take Lasix every evening. Can take polyethylene glycol one fourth of a usual dose on days when he her constipated.

## 2012-08-14 NOTE — Patient Instructions (Signed)
Can take polyethylene glycol or MiraLax one fourth of a dose on days when you're constipated.

## 2012-08-23 ENCOUNTER — Telehealth (INDEPENDENT_AMBULATORY_CARE_PROVIDER_SITE_OTHER): Payer: Self-pay | Admitting: *Deleted

## 2012-08-23 NOTE — Telephone Encounter (Signed)
Terri may we call in Cipro and Flagyl? If so verify mg and if you want to take it BID,TID and for how many days

## 2012-08-23 NOTE — Telephone Encounter (Signed)
Left lower quadrant pain. She has taken  Oxcodone 20mg  x 3 and has not touched the pain. Taken Valum x 2 and taken Dicyclomine 20mg  x 2 and nothing has helped. She rates the pain 99.7. I advised her since was hurting this much she needed to go to the ED.   She is running a fever. I am worried she may have a perforation. I advised her to call me tomorrow.

## 2012-08-23 NOTE — Telephone Encounter (Signed)
Want [name] [name] to return her call at [phone]. [name] [name] said she has been in the bed sence she see Dr. Laural Golden on 08/14/12. She is having pain in her left side and doesn't feel like it is IBS, thinks it is Diverticulitis. There has been a low grade fever running about 99.7 and would like to see if Dr. Laural Golden would call her in an antibiotic.

## 2012-08-27 DIAGNOSIS — Z452 Encounter for adjustment and management of vascular access device: Secondary | ICD-10-CM

## 2012-08-27 DIAGNOSIS — C50919 Malignant neoplasm of unspecified site of unspecified female breast: Secondary | ICD-10-CM

## 2012-10-04 ENCOUNTER — Ambulatory Visit: Payer: Managed Care, Other (non HMO) | Admitting: Physician Assistant

## 2012-10-05 ENCOUNTER — Ambulatory Visit: Payer: Managed Care, Other (non HMO) | Admitting: Physician Assistant

## 2012-10-25 ENCOUNTER — Ambulatory Visit: Payer: Managed Care, Other (non HMO) | Admitting: Physician Assistant

## 2012-11-01 ENCOUNTER — Telehealth (INDEPENDENT_AMBULATORY_CARE_PROVIDER_SITE_OTHER): Payer: Self-pay | Admitting: *Deleted

## 2012-11-01 DIAGNOSIS — K589 Irritable bowel syndrome without diarrhea: Secondary | ICD-10-CM

## 2012-11-01 MED ORDER — DICYCLOMINE HCL 20 MG PO TABS: 20.0000 mg | ORAL_TABLET | Freq: Two times a day (BID) | ORAL | Status: DC

## 2012-11-01 MED ORDER — DICYCLOMINE HCL 20 MG PO TABS: 20.0000 mg | ORAL_TABLET | Freq: Three times a day (TID) | ORAL | Status: DC

## 2012-11-01 NOTE — Telephone Encounter (Signed)
Deaven is cramping in the lower part of her stomach area. This has been going on all week. Would like to go back on 3 Dicyclomine daily. She is out and next refill is not due until Sunday 11/04/12.  Her return phone number is [phone] or [phone].

## 2012-11-01 NOTE — Telephone Encounter (Signed)
Patient states [name] [name] called in her Dicyclomine but she take 3 a day and not 2.  Can [name] [name] call [facility] or resend for 3 a day instead of 2.   Thanks

## 2012-11-01 NOTE — Telephone Encounter (Signed)
Saber called and advised her Rx has been sent to her pharmacy. Voices understood.

## 2012-11-01 NOTE — Telephone Encounter (Signed)
Please call patient and let her know Rx sent to her pharmacy.

## 2012-11-14 ENCOUNTER — Encounter: Payer: Managed Care, Other (non HMO) | Admitting: Physician Assistant

## 2012-11-14 NOTE — Progress Notes (Signed)
Primary Cardiologist: Johnny Bridge, MD (new)   HPI: Patient returns to reestablish with our group, having been previously followed by Dr. Dannielle Burn, and last seen here in clinic in April of last year.  Her last ischemic evaluation was an adequate GXT Cardiolite in December 2011, reviewed by Dr. Stanford Breed, which yielded NL perfusion with NL wall motion; EF 68%.  Her last cardiac cath was in September 2009, revealing continued patency of previously placed LAD and RCA stent sites (2000), but with a new RCA lesion, subsequently treated with PCI.  Allergies  Allergen Reactions  . Amlodipine Besylate Hives  . Doxazosin Mesylate Hives  . Erythromycin Hives  . Lisinopril Hives    REACTION: causes hives  . Metoprolol Tartrate Hives    REACTION: causes hives  . Penicillins Hives  . Prednisone     Patient states that it caused bad abdominal pain    Current Outpatient Prescriptions  Medication Sig Dispense Refill  . aspirin 81 MG chewable tablet Chew 81 mg by mouth daily.        Marland Kitchen b complex vitamins capsule Take 1 capsule by mouth daily.      . butalbital-aspirin-caffeine (FIORINAL) 50-325-40 MG per capsule Take 2 capsules by mouth daily as needed for headache.       . calcium carbonate (TUMS - DOSED IN MG ELEMENTAL CALCIUM) 500 MG chewable tablet Chew 1 tablet by mouth as needed for heartburn.       . Calcium Carbonate-Vitamin D (CALCIUM 600-D PO) Take 1 tablet by mouth daily.       . carisoprodol (SOMA) 350 MG tablet Take 350 mg by mouth 3 (three) times daily as needed for muscle spasms.      . clopidogrel (PLAVIX) 75 MG tablet Take 1 tablet (75 mg total) by mouth daily.  90 tablet  0  . Cyanocobalamin (VITAMIN B-12 SL) Place 5,000 mcg under the tongue daily.      . diazepam (VALIUM) 10 MG tablet Take 10 mg by mouth 2 (two) times daily.      Marland Kitchen dicyclomine (BENTYL) 20 MG tablet Take 1 tablet (20 mg total) by mouth 3 (three) times daily.  90 tablet  4  . diphenoxylate-atropine (LOMOTIL) 2.5-0.025  MG per tablet Take 1 tablet by mouth 4 (four) times daily as needed for diarrhea or loose stools.      . DUREZOL 0.05 % EMUL Place 3 drops into the left eye daily.       . fluticasone (FLONASE) 50 MCG/ACT nasal spray Place 2 sprays into the nose as needed for allergies.       . furosemide (LASIX) 40 MG tablet Take 20 mg by mouth daily as needed (swelling).      . gabapentin (NEURONTIN) 600 MG tablet Take 600 mg by mouth 3 (three) times daily.       Marland Kitchen ipratropium (ATROVENT) 0.03 % nasal spray Place 2 sprays into the nose every 12 (twelve) hours.      Marland Kitchen ketorolac (ACULAR) 0.4 % SOLN 1 drop 2 (two) times daily. 1 drop to both eyes daily      . ketotifen (ZADITOR) 0.025 % ophthalmic solution Place 1 drop into both eyes 2 (two) times daily.      Marland Kitchen levocetirizine (XYZAL) 5 MG tablet Take 5 mg by mouth every evening.      . nebivolol (BYSTOLIC) 5 MG tablet Take 0.5 tablets (2.5 mg total) by mouth every morning.      Marland Kitchen NITROSTAT 0.4 MG SL tablet  DISSOLVE ONE TABLET UNDER THE TONGUE EVERY 5 MINUTES AS NEEDED FOR CHEST PAIN.  DO NOT EXCEED A TOTAL OF 3 DOSES IN 15 MINUTES  25 each  2  . nystatin (MYCOSTATIN) 100000 UNIT/ML suspension Take 500,000 Units by mouth as needed.       . Omega-3 Fatty Acids (OMEGA-3 2100 PO) Take 1 capsule by mouth daily.       . ondansetron (ZOFRAN) 4 MG tablet Take 1 tablet (4 mg total) by mouth every 12 (twelve) hours as needed for nausea.  30 tablet  1  . oxyCODONE (OXYCONTIN) 20 MG 12 hr tablet Take 20 mg by mouth every 6 (six) hours as needed for pain (or cramps).       . pantoprazole (PROTONIX) 40 MG tablet Take 1 tablet (40 mg total) by mouth daily.  90 tablet  3  . pravastatin (PRAVACHOL) 40 MG tablet Take 40 mg by mouth daily.       . Probiotic Product (PROBIOTIC PO) Take 1 capsule by mouth daily.       Marland Kitchen PROLENSA 0.07 % SOLN 1 drop. ! Drop in both eyes daily      . valsartan (DIOVAN) 160 MG tablet Take 160 mg by mouth daily as needed (Only take if BP is >140.).      Marland Kitchen  Venlafaxine HCl 150 MG TB24 Take 1 tablet by mouth daily.       . vitamin E 1000 UNIT capsule Take 1,000 Units by mouth daily.       Marland Kitchen zinc gluconate 50 MG tablet Take 50 mg by mouth daily.       No current facility-administered medications for this visit.    Past Medical History  Diagnosis Date  . GERD (gastroesophageal reflux disease)   . Abdominal cramping 09/21/2010  . Chronic diarrhea   . Coronary artery disease     Status post drug eluding stent to the right coronary artery status post in-stent restenosis.  DAPT  . AAA (abdominal aortic aneurysm)   . Cancer 2010    right breast   mastectomy and cheotherapy  . IBS (irritable bowel syndrome)   . Diverticulosis   . Aortic aneurysm, abdominal   . Hypertension   . Arthritis   . Anxiety   . Depression   . Hyperlipidemia     Past Surgical History  Procedure Laterality Date  . Mastectomy  05/27/2008    RIGHT  . Small bowel givens  01/06/2009  . Ct angiography  11/02/2010    ABD &PELV  . Coronary angioplasty with stent placement  2000-2010    3 cardiac stent splaced per patient  . Appendectomy  1995  . Colonoscopy    . Cervical fusion      x6  . Cataract extraction w/phaco Left 07/19/2012    Procedure: CATARACT EXTRACTION PHACO AND INTRAOCULAR LENS PLACEMENT (IOC);  Surgeon: Tonny Branch, MD;  Location: [facility];  Service: Ophthalmology;  Laterality: Left;  CDE:9.95    History   Social History  . Marital Status: Married    Spouse Name: N/A    Number of Children: N/A  . Years of Education: N/A   Occupational History  . Not on file.   Social History Main Topics  . Smoking status: Current Every Day Smoker -- 1.00 packs/day for 45 years    Types: Cigarettes  . Smokeless tobacco: Never Used  . Alcohol Use: No  . Drug Use: No  . Sexually Active: Not on file   Other  Topics Concern  . Not on file   Social History Narrative  . No narrative on file    Family History  Problem Relation Age of Onset  . Diverticulitis  Mother   . Heart disease Father   . Aortic aneurysm Father   . Heart disease Brother   . Aortic aneurysm Brother   . Crohn's disease Brother     ROS: no nausea, vomiting; no fever, chills; no melena, hematochezia; no claudication  PHYSICAL EXAM: There were no vitals taken for this visit. GENERAL: 62 year old female; NAD HEENT: NCAT, PERRLA, EOMI; sclera clear; no xanthelasma NECK: palpable bilateral carotid pulses, no bruits; no JVD; no TM LUNGS: CTA bilaterally CARDIAC: RRR (S1, S2); no significant murmurs; no rubs or gallops ABDOMEN: soft, non-tender; intact BS EXTREMETIES: intact distal pulses; no significant peripheral edema SKIN: warm/dry; no obvious rash/lesions MUSCULOSKELETAL: no joint deformity NEURO: no focal deficit; NL affect   EKG: reviewed and available in Electronic Records   ASSESSMENT & PLAN:  No problem-specific assessment & plan notes found for this encounter.   Gene Elodie Panameno, PAC

## 2012-12-19 ENCOUNTER — Other Ambulatory Visit (INDEPENDENT_AMBULATORY_CARE_PROVIDER_SITE_OTHER): Payer: Self-pay | Admitting: Internal Medicine

## 2012-12-19 DIAGNOSIS — R197 Diarrhea, unspecified: Secondary | ICD-10-CM

## 2012-12-19 MED ORDER — DIPHENOXYLATE-ATROPINE 2.5-0.025 MG PO TABS: 1.0000 | ORAL_TABLET | Freq: Four times a day (QID) | ORAL | Status: DC | PRN

## 2013-01-09 ENCOUNTER — Encounter: Payer: Self-pay | Admitting: *Deleted

## 2013-01-09 ENCOUNTER — Ambulatory Visit: Payer: Managed Care, Other (non HMO) | Admitting: Physician Assistant

## 2013-01-09 ENCOUNTER — Ambulatory Visit (INDEPENDENT_AMBULATORY_CARE_PROVIDER_SITE_OTHER): Payer: Managed Care, Other (non HMO) | Admitting: Physician Assistant

## 2013-01-09 ENCOUNTER — Telehealth: Payer: Self-pay | Admitting: Physician Assistant

## 2013-01-09 ENCOUNTER — Encounter: Payer: Self-pay | Admitting: Physician Assistant

## 2013-01-09 VITALS — BP 117/84 | HR 89 | Ht 62.0 in | Wt 137.0 lb

## 2013-01-09 DIAGNOSIS — I200 Unstable angina: Secondary | ICD-10-CM

## 2013-01-09 DIAGNOSIS — I2510 Atherosclerotic heart disease of native coronary artery without angina pectoris: Secondary | ICD-10-CM

## 2013-01-09 DIAGNOSIS — I5032 Chronic diastolic (congestive) heart failure: Secondary | ICD-10-CM

## 2013-01-09 DIAGNOSIS — Z01810 Encounter for preprocedural cardiovascular examination: Secondary | ICD-10-CM

## 2013-01-09 LAB — PROTIME-INR

## 2013-01-09 MED ORDER — CLOPIDOGREL BISULFATE 75 MG PO TABS: 75.0000 mg | ORAL_TABLET | Freq: Every day | ORAL | Status: DC

## 2013-01-09 MED ORDER — ASPIRIN EC 81 MG PO TBEC: 81.0000 mg | DELAYED_RELEASE_TABLET | Freq: Every day | ORAL | Status: AC

## 2013-01-09 MED ORDER — NITROGLYCERIN 0.4 MG SL SUBL: 0.4000 mg | SUBLINGUAL_TABLET | SUBLINGUAL | Status: AC | PRN

## 2013-01-09 NOTE — Progress Notes (Signed)
Primary Cardiologist: Kate Sable, MD   HPI: Patient returns to clinic to reestablish with our group, last seen here in April 2013, by Dr. Dannielle Burn.  She presents with several month history of CP, with/without exertion, but reminiscent of her symptoms prior to undergoing PCI. She had a particularly severe episode 2 weeks ago while at home, but refused recommendation, by EMS, to come to the ED. She said that this was sharp and quite severe (10/10), with associated radiation to the left side of the jaw and left upper extremity, as well as associated diaphoresis and nausea. She took a total of 6 NTG tablets, with eventual relief. She has had intermittent CP since then, but not nearly as intense.  However, she also reports being recently diagnosed with a peptic ulcer, and is scheduled to see Dr. Laural Golden next month. She has not yet undergone upper endoscopy, but reportedly had significant discomfort with palpation of the epigastrium, during a recent exam.   12-lead EKG today, reviewed by me, indicates NSR 97 bpm; NL axis; NSST changes  Allergies  Allergen Reactions  . Amlodipine Besylate Hives  . Doxazosin Mesylate Hives  . Erythromycin Hives  . Lisinopril Hives    REACTION: causes hives  . Metoprolol Tartrate Hives    REACTION: causes hives  . Penicillins Hives  . Prednisone     Patient states that it caused bad abdominal pain    Current Outpatient Prescriptions  Medication Sig Dispense Refill  . b complex vitamins capsule Take 1 capsule by mouth daily.      . butalbital-aspirin-caffeine (FIORINAL) 50-325-40 MG per capsule Take 2 capsules by mouth every 6 (six) hours as needed.      . carisoprodol (SOMA) 350 MG tablet Take 350 mg by mouth at bedtime as needed for muscle spasms.       . cetirizine (ZYRTEC) 10 MG tablet Take 10 mg by mouth daily.      . clopidogrel (PLAVIX) 75 MG tablet Take 1 tablet (75 mg total) by mouth daily.  90 tablet  0  . Coenzyme Q10 (CO Q 10) 100 MG CAPS Take  1 capsule by mouth daily.      . diazepam (VALIUM) 5 MG tablet Take 1 tablet by mouth every morning. And 2 tablets at night      . dicyclomine (BENTYL) 20 MG tablet Take 1 tablet (20 mg total) by mouth 3 (three) times daily.  90 tablet  4  . diphenoxylate-atropine (LOMOTIL) 2.5-0.025 MG per tablet Take 1 tablet by mouth 4 (four) times daily as needed for diarrhea or loose stools.  90 tablet  0  . fluticasone (FLONASE) 50 MCG/ACT nasal spray Place 2 sprays into the nose as needed for allergies.       . furosemide (LASIX) 40 MG tablet Take 20 mg by mouth daily as needed (swelling).      . gabapentin (NEURONTIN) 600 MG tablet Take 600 mg by mouth 2 (two) times daily.       . Multiple Vitamins-Minerals (MULTIVITAMIN WITH MINERALS) tablet Take 1 tablet by mouth daily.      . nebivolol (BYSTOLIC) 5 MG tablet Take 5 mg by mouth every morning.      . Omega-3 Fatty Acids (OMEGA-3 2100 PO) Take 1 capsule by mouth daily.       Marland Kitchen oxyCODONE (OXYCONTIN) 20 MG 12 hr tablet Take 20 mg by mouth every 6 (six) hours as needed for pain (or cramps).       . pantoprazole (PROTONIX)  40 MG tablet Take 1 tablet (40 mg total) by mouth daily.  90 tablet  3  . potassium chloride (K-DUR) 10 MEQ tablet Take 10 mEq by mouth as needed.      . pravastatin (PRAVACHOL) 40 MG tablet Take 40 mg by mouth daily.       . Probiotic Product (PROBIOTIC PO) Take 1 capsule by mouth daily.       . promethazine (PHENERGAN) 25 MG tablet Take 25 mg by mouth every 6 (six) hours as needed for nausea.      . valsartan (DIOVAN) 160 MG tablet Take 160 mg by mouth daily.       . vitamin E 1000 UNIT capsule Take 1,000 Units by mouth daily.       Marland Kitchen aspirin 325 MG tablet Take 325 mg by mouth daily.       No current facility-administered medications for this visit.    Past Medical History  Diagnosis Date  . GERD (gastroesophageal reflux disease)   . Abdominal cramping 09/21/2010  . Chronic diarrhea   . Coronary artery disease     Status post  drug eluding stent to the right coronary artery status post in-stent restenosis.  DAPT  . AAA (abdominal aortic aneurysm)   . Cancer 2010    right breast   mastectomy and cheotherapy  . IBS (irritable bowel syndrome)   . Diverticulosis   . Aortic aneurysm, abdominal   . Hypertension   . Arthritis   . Anxiety   . Depression   . Hyperlipidemia     Past Surgical History  Procedure Laterality Date  . Mastectomy  05/27/2008    RIGHT  . Small bowel givens  01/06/2009  . Ct angiography  11/02/2010    ABD &PELV  . Coronary angioplasty with stent placement  2000-2010    3 cardiac stent splaced per patient  . Appendectomy  1995  . Colonoscopy    . Cervical fusion      x6  . Cataract extraction w/phaco Left 07/19/2012    Procedure: CATARACT EXTRACTION PHACO AND INTRAOCULAR LENS PLACEMENT (IOC);  Surgeon: Tonny Branch, MD;  Location: [facility];  Service: Ophthalmology;  Laterality: Left;  CDE:9.95    History   Social History  . Marital Status: Married    Spouse Name: N/A    Number of Children: N/A  . Years of Education: N/A   Occupational History  . Not on file.   Social History Main Topics  . Smoking status: Current Every Day Smoker -- 1.00 packs/day for 45 years    Types: Cigarettes  . Smokeless tobacco: Never Used  . Alcohol Use: No  . Drug Use: No  . Sexual Activity: Not on file   Other Topics Concern  . Not on file   Social History Narrative  . No narrative on file    Family History  Problem Relation Age of Onset  . Diverticulitis Mother   . Heart disease Father   . Aortic aneurysm Father   . Heart disease Brother   . Aortic aneurysm Brother   . Crohn's disease Brother     ROS: no nausea, vomiting; no fever, chills; no melena, hematochezia; no claudication  PHYSICAL EXAM: BP 117/84  Pulse 89  Ht 5\' 2"  (1.575 m)  Wt 137 lb (62.143 kg)  BMI 25.05 kg/m2  SpO2 97% GENERAL: 62 year-old female; NAD HEENT: NCAT, PERRLA, EOMI; sclera clear; no xanthelasma NECK:  palpable bilateral carotid pulses, no bruits; no JVD; no TM  LUNGS: CTA bilaterally CARDIAC: RRR (S1, S2); no significant murmurs; no rubs or gallops ABDOMEN: soft, non-tender; intact BS EXTREMETIES: Palpable bilateral femoral pulses, without bruits; nonpalpable PTs; no significant peripheral edema SKIN: warm/dry; no obvious rash/lesions MUSCULOSKELETAL: no joint deformity NEURO: no focal deficit; NL affect   EKG: reviewed and available in Electronic Records   ASSESSMENT & PLAN:  Coronary artery disease Recommendation is to proceed with diagnostic coronary angiography for definitive exclusion of significant CAD progression, since her last study in 2009. This indicated continued patency of the LAD and RCA stents, but with a new 70-75% RCA lesion. Overall LVF was preserved, with no definite WMAs. She then had a NL GXT Cardiolite, here at [facility], in December 2011. Although her history is complicated by suggestion of recent diagnosis of possible PUD, she does suggest several month history of recurrent CP, with/without exertion, with a particularly severe episode at rest approximately 2 weeks ago. Added to this is the fact that she has continued to smoke 1 ppd. She is currently pain-free and EKG is benign. Following complete review with Dr. Bronson Ing, plan is to proceed with elective cardiac catheterization later this week. The risks/benefits were discussed with the patient, who is quite agreeable with this plan.    Gene Jasan Doughtie, PAC

## 2013-01-09 NOTE — Telephone Encounter (Signed)
[employer] [id number] expires 04-09-13

## 2013-01-09 NOTE — Assessment & Plan Note (Signed)
Recommendation is to proceed with diagnostic coronary angiography for definitive exclusion of significant CAD progression, since her last study in 2009. This indicated continued patency of the LAD and RCA stents, but with a new 70-75% RCA lesion. Overall LVF was preserved, with no definite WMAs. She then had a NL GXT Cardiolite, here at [facility], in December 2011. Although her history is complicated by suggestion of recent diagnosis of possible PUD, she does suggest several month history of recurrent CP, with/without exertion, with a particularly severe episode at rest approximately 2 weeks ago. Added to this is the fact that she has continued to smoke 1 ppd. She is currently pain-free and EKG is benign. Following complete review with Dr. Bronson Ing, plan is to proceed with elective cardiac catheterization later this week. The risks/benefits were discussed with the patient, who is quite agreeable with this plan.

## 2013-01-09 NOTE — Telephone Encounter (Signed)
:   L JV heart cath - Friday, 8/29 - 10:30 - [place]

## 2013-01-09 NOTE — Patient Instructions (Signed)
Your physician has requested that you have a cardiac catheterization. Cardiac catheterization is used to diagnose and/or treat various heart conditions. Doctors may recommend this procedure for a number of different reasons. The most common reason is to evaluate chest pain. Chest pain can be a symptom of coronary artery disease (CAD), and cardiac catheterization can show whether plaque is narrowing or blocking your heart's arteries. This procedure is also used to evaluate the valves, as well as measure the blood flow and oxygen levels in different parts of your heart. For further information please visit [email or web address]. Please follow instruction sheet, as given. Nitroglycerin as needed for severe chest pain only - refill sent to pharm Plavix - refill sent to pharm Decrease Aspirin to 81mg  daily  Continue all other current medications. Follow up will be given after procedure above

## 2013-01-10 ENCOUNTER — Other Ambulatory Visit: Payer: Self-pay | Admitting: Physician Assistant

## 2013-01-10 DIAGNOSIS — R079 Chest pain, unspecified: Secondary | ICD-10-CM

## 2013-01-11 SURGERY — JV LEFT HEART CATHETERIZATION WITH CORONARY ANGIOGRAM
Anesthesia: Moderate Sedation

## 2013-01-15 ENCOUNTER — Inpatient Hospital Stay (HOSPITAL_BASED_OUTPATIENT_CLINIC_OR_DEPARTMENT_OTHER)
Admission: RE | Admit: 2013-01-15 | Payer: Managed Care, Other (non HMO) | Source: Ambulatory Visit | Admitting: Cardiology

## 2013-01-17 ENCOUNTER — Telehealth: Payer: Self-pay | Admitting: *Deleted

## 2013-01-17 NOTE — Telephone Encounter (Signed)
Patient previously was scheduled for Left JV heart cath on 01/11/2013.  Patient was unable to go due to IBS flare up per patient.  Stomach was very upset.    Rescheduled for Friday, 01/18/2013 at 11:30 with Dr. Julianne Handler.  She will arrive at 10:30.  Instructions reviewed with patient.  All labs done previously can still be used for this date.  Patient verbalized understanding.

## 2013-01-18 ENCOUNTER — Inpatient Hospital Stay (HOSPITAL_BASED_OUTPATIENT_CLINIC_OR_DEPARTMENT_OTHER)
Admission: RE | Admit: 2013-01-18 | Discharge: 2013-01-18 | Disposition: A | Discharge status: Home / Self Care | Payer: Managed Care, Other (non HMO) | Source: Ambulatory Visit | Attending: Cardiovascular Disease | Admitting: Cardiovascular Disease

## 2013-01-18 ENCOUNTER — Encounter (HOSPITAL_BASED_OUTPATIENT_CLINIC_OR_DEPARTMENT_OTHER): Payer: Self-pay

## 2013-01-18 ENCOUNTER — Encounter (HOSPITAL_BASED_OUTPATIENT_CLINIC_OR_DEPARTMENT_OTHER)
Admission: RE | Disposition: A | Discharge status: Home / Self Care | Payer: Self-pay | Source: Ambulatory Visit | Attending: Cardiovascular Disease

## 2013-01-18 DIAGNOSIS — I2510 Atherosclerotic heart disease of native coronary artery without angina pectoris: Secondary | ICD-10-CM | POA: Insufficient documentation

## 2013-01-18 DIAGNOSIS — R079 Chest pain, unspecified: Secondary | ICD-10-CM | POA: Insufficient documentation

## 2013-01-18 DIAGNOSIS — I10 Essential (primary) hypertension: Secondary | ICD-10-CM | POA: Insufficient documentation

## 2013-01-18 DIAGNOSIS — Z9861 Coronary angioplasty status: Secondary | ICD-10-CM | POA: Insufficient documentation

## 2013-01-18 SURGERY — JV LEFT HEART CATHETERIZATION WITH CORONARY ANGIOGRAM

## 2013-01-18 MED ORDER — SODIUM CHLORIDE 0.9 % IJ SOLN: 3.0000 mL | Freq: Two times a day (BID) | INTRAMUSCULAR | Status: DC

## 2013-01-18 MED ORDER — ASPIRIN 81 MG PO CHEW
324.0000 mg | CHEWABLE_TABLET | ORAL | Status: AC
  Administered 2013-01-18: 243 mg via ORAL

## 2013-01-18 MED ORDER — SODIUM CHLORIDE 0.9 % IV SOLN: 250.0000 mL | INTRAVENOUS | Status: DC | PRN

## 2013-01-18 MED ORDER — SODIUM CHLORIDE 0.9 % IV SOLN: INTRAVENOUS | Status: AC

## 2013-01-18 MED ORDER — ACETAMINOPHEN 325 MG PO TABS: 650.0000 mg | ORAL_TABLET | ORAL | Status: DC | PRN

## 2013-01-18 MED ORDER — SODIUM CHLORIDE 0.9 % IV SOLN: INTRAVENOUS | Status: DC

## 2013-01-18 MED ORDER — ONDANSETRON HCL 4 MG/2ML IJ SOLN: 4.0000 mg | Freq: Four times a day (QID) | INTRAMUSCULAR | Status: DC | PRN

## 2013-01-18 MED ORDER — SODIUM CHLORIDE 0.9 % IJ SOLN: 3.0000 mL | INTRAMUSCULAR | Status: DC | PRN

## 2013-01-18 NOTE — H&P (View-Only) (Signed)
Primary Cardiologist: Kate Sable, MD   HPI: Patient returns to clinic to reestablish with our group, last seen here in April 2013, by Dr. Dannielle Burn.  She presents with several month history of CP, with/without exertion, but reminiscent of her symptoms prior to undergoing PCI. She had a particularly severe episode 2 weeks ago while at home, but refused recommendation, by EMS, to come to the ED. She said that this was sharp and quite severe (10/10), with associated radiation to the left side of the jaw and left upper extremity, as well as associated diaphoresis and nausea. She took a total of 6 NTG tablets, with eventual relief. She has had intermittent CP since then, but not nearly as intense.  However, she also reports being recently diagnosed with a peptic ulcer, and is scheduled to see Dr. Laural Golden next month. She has not yet undergone upper endoscopy, but reportedly had significant discomfort with palpation of the epigastrium, during a recent exam.   12-lead EKG today, reviewed by me, indicates NSR 97 bpm; NL axis; NSST changes  Allergies  Allergen Reactions  . Amlodipine Besylate Hives  . Doxazosin Mesylate Hives  . Erythromycin Hives  . Lisinopril Hives    REACTION: causes hives  . Metoprolol Tartrate Hives    REACTION: causes hives  . Penicillins Hives  . Prednisone     Patient states that it caused bad abdominal pain    Current Outpatient Prescriptions  Medication Sig Dispense Refill  . b complex vitamins capsule Take 1 capsule by mouth daily.      . butalbital-aspirin-caffeine (FIORINAL) 50-325-40 MG per capsule Take 2 capsules by mouth every 6 (six) hours as needed.      . carisoprodol (SOMA) 350 MG tablet Take 350 mg by mouth at bedtime as needed for muscle spasms.       . cetirizine (ZYRTEC) 10 MG tablet Take 10 mg by mouth daily.      . clopidogrel (PLAVIX) 75 MG tablet Take 1 tablet (75 mg total) by mouth daily.  90 tablet  0  . Coenzyme Q10 (CO Q 10) 100 MG CAPS Take  1 capsule by mouth daily.      . diazepam (VALIUM) 5 MG tablet Take 1 tablet by mouth every morning. And 2 tablets at night      . dicyclomine (BENTYL) 20 MG tablet Take 1 tablet (20 mg total) by mouth 3 (three) times daily.  90 tablet  4  . diphenoxylate-atropine (LOMOTIL) 2.5-0.025 MG per tablet Take 1 tablet by mouth 4 (four) times daily as needed for diarrhea or loose stools.  90 tablet  0  . fluticasone (FLONASE) 50 MCG/ACT nasal spray Place 2 sprays into the nose as needed for allergies.       . furosemide (LASIX) 40 MG tablet Take 20 mg by mouth daily as needed (swelling).      . gabapentin (NEURONTIN) 600 MG tablet Take 600 mg by mouth 2 (two) times daily.       . Multiple Vitamins-Minerals (MULTIVITAMIN WITH MINERALS) tablet Take 1 tablet by mouth daily.      . nebivolol (BYSTOLIC) 5 MG tablet Take 5 mg by mouth every morning.      . Omega-3 Fatty Acids (OMEGA-3 2100 PO) Take 1 capsule by mouth daily.       Marland Kitchen oxyCODONE (OXYCONTIN) 20 MG 12 hr tablet Take 20 mg by mouth every 6 (six) hours as needed for pain (or cramps).       . pantoprazole (PROTONIX)  40 MG tablet Take 1 tablet (40 mg total) by mouth daily.  90 tablet  3  . potassium chloride (K-DUR) 10 MEQ tablet Take 10 mEq by mouth as needed.      . pravastatin (PRAVACHOL) 40 MG tablet Take 40 mg by mouth daily.       . Probiotic Product (PROBIOTIC PO) Take 1 capsule by mouth daily.       . promethazine (PHENERGAN) 25 MG tablet Take 25 mg by mouth every 6 (six) hours as needed for nausea.      . valsartan (DIOVAN) 160 MG tablet Take 160 mg by mouth daily.       . vitamin E 1000 UNIT capsule Take 1,000 Units by mouth daily.       Marland Kitchen aspirin 325 MG tablet Take 325 mg by mouth daily.       No current facility-administered medications for this visit.    Past Medical History  Diagnosis Date  . GERD (gastroesophageal reflux disease)   . Abdominal cramping 09/21/2010  . Chronic diarrhea   . Coronary artery disease     Status post  drug eluding stent to the right coronary artery status post in-stent restenosis.  DAPT  . AAA (abdominal aortic aneurysm)   . Cancer 2010    right breast   mastectomy and cheotherapy  . IBS (irritable bowel syndrome)   . Diverticulosis   . Aortic aneurysm, abdominal   . Hypertension   . Arthritis   . Anxiety   . Depression   . Hyperlipidemia     Past Surgical History  Procedure Laterality Date  . Mastectomy  05/27/2008    RIGHT  . Small bowel givens  01/06/2009  . Ct angiography  11/02/2010    ABD &PELV  . Coronary angioplasty with stent placement  2000-2010    3 cardiac stent splaced per patient  . Appendectomy  1995  . Colonoscopy    . Cervical fusion      x6  . Cataract extraction w/phaco Left 07/19/2012    Procedure: CATARACT EXTRACTION PHACO AND INTRAOCULAR LENS PLACEMENT (IOC);  Surgeon: Tonny Branch, MD;  Location: [facility];  Service: Ophthalmology;  Laterality: Left;  CDE:9.95    History   Social History  . Marital Status: Married    Spouse Name: N/A    Number of Children: N/A  . Years of Education: N/A   Occupational History  . Not on file.   Social History Main Topics  . Smoking status: Current Every Day Smoker -- 1.00 packs/day for 45 years    Types: Cigarettes  . Smokeless tobacco: Never Used  . Alcohol Use: No  . Drug Use: No  . Sexual Activity: Not on file   Other Topics Concern  . Not on file   Social History Narrative  . No narrative on file    Family History  Problem Relation Age of Onset  . Diverticulitis Mother   . Heart disease Father   . Aortic aneurysm Father   . Heart disease Brother   . Aortic aneurysm Brother   . Crohn's disease Brother     ROS: no nausea, vomiting; no fever, chills; no melena, hematochezia; no claudication  PHYSICAL EXAM: BP 117/84  Pulse 89  Ht 5\' 2"  (1.575 m)  Wt 137 lb (62.143 kg)  BMI 25.05 kg/m2  SpO2 97% GENERAL: 62 year-old female; NAD HEENT: NCAT, PERRLA, EOMI; sclera clear; no xanthelasma NECK:  palpable bilateral carotid pulses, no bruits; no JVD; no TM  LUNGS: CTA bilaterally CARDIAC: RRR (S1, S2); no significant murmurs; no rubs or gallops ABDOMEN: soft, non-tender; intact BS EXTREMETIES: Palpable bilateral femoral pulses, without bruits; nonpalpable PTs; no significant peripheral edema SKIN: warm/dry; no obvious rash/lesions MUSCULOSKELETAL: no joint deformity NEURO: no focal deficit; NL affect   EKG: reviewed and available in Electronic Records   ASSESSMENT & PLAN:  Coronary artery disease Recommendation is to proceed with diagnostic coronary angiography for definitive exclusion of significant CAD progression, since her last study in 2009. This indicated continued patency of the LAD and RCA stents, but with a new 70-75% RCA lesion. Overall LVF was preserved, with no definite WMAs. She then had a NL GXT Cardiolite, here at [facility], in December 2011. Although her history is complicated by suggestion of recent diagnosis of possible PUD, she does suggest several month history of recurrent CP, with/without exertion, with a particularly severe episode at rest approximately 2 weeks ago. Added to this is the fact that she has continued to smoke 1 ppd. She is currently pain-free and EKG is benign. Following complete review with Dr. Bronson Ing, plan is to proceed with elective cardiac catheterization later this week. The risks/benefits were discussed with the patient, who is quite agreeable with this plan.    Gene Madden Garron, PAC

## 2013-01-18 NOTE — Interval H&P Note (Signed)
History and Physical Interval Note:  01/18/2013 12:38 PM  EULA KELSALL  has presented today for cardiac cath with the diagnosis of cp, known CAD.  The various methods of treatment have been discussed with the patient and family. After consideration of risks, benefits and other options for treatment, the patient has consented to  Procedure(s): JV LEFT HEART CATHETERIZATION WITH CORONARY ANGIOGRAM (N/A) as a surgical intervention .  The patient's history has been reviewed, patient examined, no change in status, stable for surgery.  I have reviewed the patient's chart and labs.  Questions were answered to the patient's satisfaction.    Cath Lab Visit (complete for each Cath Lab visit)  Clinical Evaluation Leading to the Procedure:   ACS: no  Non-ACS:    Anginal Classification: CCS III  Anti-ischemic medical therapy: Minimal Therapy (1 class of medications)  Non-Invasive Test Results: No non-invasive testing performed  Prior CABG: No previous CABG        Tuck Dulworth

## 2013-01-18 NOTE — CV Procedure (Signed)
   Cardiac Catheterization Operative Report  [name] [name] [id number] 9/5/20141:13 PM VYAS,DHRUV B., MD  Procedure Performed:  1. Left Heart Catheterization 2. Selective Coronary Angiography 3. Left ventricular angiogram  Operator: Lauree Chandler, MD  Indication: 62 yo female with history of CAD with prior stenting of the LAD and RCA with recent chest pain concerning for unstable angina.                                     Procedure Details: The risks, benefits, complications, treatment options, and expected outcomes were discussed with the patient. The patient and/or family concurred with the proposed plan, giving informed consent. The patient was brought to the cath lab after IV hydration was begun and oral premedication was given. The patient was further sedated with Versed and Fentanyl. The right groin was prepped and draped in the usual manner. Using the modified Seldinger access technique, a 4 French sheath was placed in the right femoral artery. Standard diagnostic catheters were used to perform selective coronary angiography. A pigtail catheter was used to perform a left ventricular angiogram.  There were no immediate complications. The patient was taken to the recovery area in stable condition.   Hemodynamic Findings: Central aortic pressure: 165/80 Left ventricular pressure: 164/22/29  Angiographic Findings:  Left main: No obstructive disease.   Left Anterior Descending Artery: Large caliber vessel that courses to the apex. The proximal vessel has a patent stent with 20% in-stent restenosis. There are several very small caliber diagonal branches.   Circumflex Artery: Moderate caliber vessel with 30% proximal stenosis leading into a small OM branch. There is a small intermediate branch with ostial/proximal 50% stenosis. This is a 1.5-1.75 mm vessel.   Right Coronary Artery: Moderate to large caliber dominant vessel with patent proximal stent extending back to the  ostium. There is a 30% stenosis in the mid portion of the stented segment. This does not appear to flow limiting. The mid vessel has 30% stenosis.   Left Ventricular Angiogram: LVEF=65%  Impression: 1. Stable double vessel CAD 2. Preserved LV systolic function  Recommendations: Continue medical management of CAD.        Complications:  None. The patient tolerated the procedure well.

## 2013-01-18 NOTE — Progress Notes (Signed)
Pt received form cath procedure alert and oriented and denies any discomfort at this time.  Dr Estevan Ryder into talk to pt and family about the results of test and plan of care.  Pt able to tolerate fluid intake.

## 2013-02-04 ENCOUNTER — Encounter: Payer: Self-pay | Admitting: Vascular Surgery

## 2013-02-05 ENCOUNTER — Ambulatory Visit (INDEPENDENT_AMBULATORY_CARE_PROVIDER_SITE_OTHER): Payer: Managed Care, Other (non HMO) | Admitting: Vascular Surgery

## 2013-02-05 ENCOUNTER — Encounter: Payer: Self-pay | Admitting: Vascular Surgery

## 2013-02-05 ENCOUNTER — Encounter (INDEPENDENT_AMBULATORY_CARE_PROVIDER_SITE_OTHER): Payer: Managed Care, Other (non HMO) | Admitting: *Deleted

## 2013-02-05 VITALS — BP 138/85 | HR 72 | Ht 62.0 in | Wt 141.0 lb

## 2013-02-05 DIAGNOSIS — I714 Abdominal aortic aneurysm, without rupture, unspecified: Secondary | ICD-10-CM | POA: Insufficient documentation

## 2013-02-05 NOTE — Progress Notes (Signed)
The patient presents today for followup of her small infrarenal abdominal aortic aneurysm. This was initially found on CT scan. She does have irritable bowel syndrome and reports that she is continuing to have severe lower abdominal cramping. Her CT scan from one year ago revealed a 3 cm infrarenal abdominal aortic aneurysm. She's had no other new major health issues  Past Medical History  Diagnosis Date  . GERD (gastroesophageal reflux disease)   . Abdominal cramping 09/21/2010  . Chronic diarrhea   . Coronary artery disease     Status post drug eluding stent to the right coronary artery status post in-stent restenosis.  DAPT  . AAA (abdominal aortic aneurysm)   . Cancer 2010    right breast   mastectomy and cheotherapy  . IBS (irritable bowel syndrome)   . Diverticulosis   . Aortic aneurysm, abdominal   . Hypertension   . Arthritis   . Anxiety   . Depression   . Hyperlipidemia     History  Substance Use Topics  . Smoking status: Current Every Day Smoker -- 1.00 packs/day for 45 years    Types: Cigarettes  . Smokeless tobacco: Never Used     Comment: using e cigs  . Alcohol Use: No    Family History  Problem Relation Age of Onset  . Diverticulitis Mother   . Heart disease Father   . Aortic aneurysm Father   . Heart disease Brother   . Aortic aneurysm Brother   . Crohn's disease Brother     Allergies  Allergen Reactions  . Amlodipine Besylate Hives  . Doxazosin Mesylate Hives  . Erythromycin Hives  . Lisinopril Hives    REACTION: causes hives  . Metoprolol Tartrate Hives    REACTION: causes hives  . Penicillins Hives  . Prednisone     Patient states that it caused bad abdominal pain    Current outpatient prescriptions:aspirin EC 81 MG tablet, Take 1 tablet (81 mg total) by mouth daily., Disp: , Rfl: ;  b complex vitamins capsule, Take 1 capsule by mouth daily., Disp: , Rfl: ;  carisoprodol (SOMA) 350 MG tablet, Take 350 mg by mouth at bedtime as needed for  muscle spasms. , Disp: , Rfl: ;  cetirizine (ZYRTEC) 10 MG tablet, Take 10 mg by mouth daily., Disp: , Rfl:  clopidogrel (PLAVIX) 75 MG tablet, Take 1 tablet (75 mg total) by mouth daily., Disp: 30 tablet, Rfl: 6;  Coenzyme Q10 (CO Q 10) 100 MG CAPS, Take 1 capsule by mouth daily., Disp: , Rfl: ;  diazepam (VALIUM) 5 MG tablet, Take 1 tablet by mouth every morning. And 2 tablets at night, Disp: , Rfl: ;  dicyclomine (BENTYL) 20 MG tablet, Take 1 tablet (20 mg total) by mouth 3 (three) times daily., Disp: 90 tablet, Rfl: 4 diphenoxylate-atropine (LOMOTIL) 2.5-0.025 MG per tablet, Take 1 tablet by mouth 4 (four) times daily as needed for diarrhea or loose stools., Disp: 90 tablet, Rfl: 0;  fluticasone (FLONASE) 50 MCG/ACT nasal spray, Place 2 sprays into the nose as needed for allergies. , Disp: , Rfl: ;  furosemide (LASIX) 40 MG tablet, Take 20 mg by mouth daily as needed (swelling)., Disp: , Rfl:  gabapentin (NEURONTIN) 600 MG tablet, Take 600 mg by mouth 2 (two) times daily. , Disp: , Rfl: ;  Multiple Vitamins-Minerals (MULTIVITAMIN WITH MINERALS) tablet, Take 1 tablet by mouth daily., Disp: , Rfl: ;  nebivolol (BYSTOLIC) 5 MG tablet, Take 5 mg by mouth every morning., Disp: ,  Rfl:  nitroGLYCERIN (NITROSTAT) 0.4 MG SL tablet, Place 1 tablet (0.4 mg total) under the tongue every 5 (five) minutes as needed for chest pain., Disp: 25 tablet, Rfl: 3;  oxyCODONE (OXYCONTIN) 20 MG 12 hr tablet, Take 20 mg by mouth every 6 (six) hours as needed for pain (or cramps). , Disp: , Rfl: ;  Oxycodone HCl 20 MG TABS, Take 1 tablet by mouth., Disp: , Rfl:  pantoprazole (PROTONIX) 40 MG tablet, Take 1 tablet (40 mg total) by mouth daily., Disp: 90 tablet, Rfl: 3;  pravastatin (PRAVACHOL) 40 MG tablet, Take 40 mg by mouth daily. , Disp: , Rfl: ;  Probiotic Product (PROBIOTIC PO), Take 1 capsule by mouth daily. , Disp: , Rfl: ;  promethazine (PHENERGAN) 25 MG tablet, Take 25 mg by mouth every 6 (six) hours as needed for nausea.,  Disp: , Rfl:  valsartan (DIOVAN) 160 MG tablet, Take 160 mg by mouth daily. , Disp: , Rfl: ;  vitamin E 1000 UNIT capsule, Take 1,000 Units by mouth daily. , Disp: , Rfl: ;  butalbital-aspirin-caffeine (FIORINAL) 50-325-40 MG per capsule, Take 2 capsules by mouth every 6 (six) hours as needed., Disp: , Rfl: ;  Omega-3 Fatty Acids (OMEGA-3 2100 PO), Take 1 capsule by mouth daily. , Disp: , Rfl:  potassium chloride (K-DUR) 10 MEQ tablet, Take 10 mEq by mouth as needed., Disp: , Rfl:   BP 138/85  Pulse 72  Ht 5\' 2"  (1.575 m)  Wt 141 lb (63.957 kg)  BMI 25.78 kg/m2  SpO2 100%  Body mass index is 25.78 kg/(m^2).       Physical exam: Well-developed well-nourished white female in no acute distress Abdomen soft nontender no masses noted. No aneurysm palpable Respirations nonlabored Pulse status 2+ radial 2+ femoral 2+ popliteal and 2+ dorsalis pedis pulses bilaterally with no evidence of peripheral aneurysm Neurologically she is grossly intact  Vascular lab study ultrasound in our office today revealed no change with a maximal diameter of 3.0 cm  Impression and plan small infrarenal abdominal aortic aneurysm with no growth in one year. I again discussed symptoms of leaking aneurysm with the patient. This would be quite different from her typical cramping from the irritable bowel syndrome. She will be seen again in one year for repeat ultrasound. I did discuss symptoms of leaking aneurysm she knows to call 911 for immediate help. She may require surgery for cervical disc disease next the duodenum no contraindication related to her small aneurysm.

## 2013-02-06 ENCOUNTER — Encounter: Payer: Managed Care, Other (non HMO) | Admitting: Cardiovascular Disease

## 2013-02-06 NOTE — Addendum Note (Signed)
Addended by: Mena Goes on: 02/06/2013 11:45 AM   Modules accepted: Orders

## 2013-02-12 ENCOUNTER — Ambulatory Visit (INDEPENDENT_AMBULATORY_CARE_PROVIDER_SITE_OTHER): Payer: Managed Care, Other (non HMO) | Admitting: Internal Medicine

## 2013-02-12 ENCOUNTER — Encounter: Payer: Managed Care, Other (non HMO) | Admitting: Cardiovascular Disease

## 2013-02-12 DIAGNOSIS — R1011 Right upper quadrant pain: Secondary | ICD-10-CM

## 2013-02-12 DIAGNOSIS — R197 Diarrhea, unspecified: Secondary | ICD-10-CM

## 2013-02-12 DIAGNOSIS — K219 Gastro-esophageal reflux disease without esophagitis: Secondary | ICD-10-CM

## 2013-02-12 DIAGNOSIS — K589 Irritable bowel syndrome without diarrhea: Secondary | ICD-10-CM

## 2013-02-12 MED ORDER — DIPHENOXYLATE-ATROPINE 2.5-0.025 MG PO TABS: 1.0000 | ORAL_TABLET | Freq: Four times a day (QID) | ORAL | Status: DC | PRN

## 2013-02-12 MED ORDER — FAMOTIDINE 20 MG PO CHEW: 20.0000 mg | CHEWABLE_TABLET | Freq: Every day | ORAL | Status: DC | PRN

## 2013-02-12 NOTE — Patient Instructions (Signed)
Discontinue Zegerid or omeprazole/soda bicarbonate. Take famotidine or Pepcid 20 mg by mouth on as-needed basis for breakthrough symptoms in the evening. Physician will contact you with results of HIDA scan

## 2013-02-12 NOTE — Progress Notes (Addendum)
Presenting complaint;  Followup for diarrhea and GERD. Patient complains of right upper quadrant abdominal pain.   Subjective:  Patient is 62 year old Caucasian female who presents for scheduled visit. She was last seen 6 months ago. She states she is doing well as far as her diarrhea is concerned. On most days she has 1-2 formed stools. Every now and then mentioned she is upset or stressed out she may have 5-6 bowel movements per day. She denies side effects with medications she takes to control stool frequency. She also denies constipation. Today she is complaining of intermittent episodes of right upper quadrant pain associated with nausea and sporadic vomiting. She has a few spells a week and most of these occur following meals. She was seen by Ms. Nicanor Bake NP and underwent upper abdominal ultrasound which was within normal limits. She also complains of frequent heartburn and intermittent chest pain.  She had cardiac catheter on 01/18/2013 revealing stable double vessel CAD.  Her appetite is normal and her weight has been stable.   Current Medications: Current Outpatient Prescriptions  Medication Sig Dispense Refill  . acetaminophen-codeine (TYLENOL #3) 300-30 MG per tablet Take 1 tablet by mouth every 6 (six) hours as needed for pain.      Marland Kitchen aspirin EC 81 MG tablet Take 1 tablet (81 mg total) by mouth daily.      . carisoprodol (SOMA) 350 MG tablet Take 350 mg by mouth at bedtime as needed for muscle spasms.       . cetirizine (ZYRTEC) 10 MG tablet Take 10 mg by mouth daily.      . Cetirizine-Pseudoephedrine (ZYRTEC-D ALLERGY & CONGESTION PO) Take by mouth daily.      . Cholecalciferol (VITAMIN D3) 3000 UNITS TABS Take 1,000 Units by mouth.      . clopidogrel (PLAVIX) 75 MG tablet Take 1 tablet (75 mg total) by mouth daily.  30 tablet  6  . diazepam (VALIUM) 5 MG tablet Take 1 tablet by mouth every morning. And 2 tablets at night      . dicyclomine (BENTYL) 20 MG tablet Take 1  tablet (20 mg total) by mouth 3 (three) times daily.  90 tablet  4  . diphenoxylate-atropine (LOMOTIL) 2.5-0.025 MG per tablet Take 1 tablet by mouth 4 (four) times daily as needed for diarrhea or loose stools.  90 tablet  0  . fluticasone (FLONASE) 50 MCG/ACT nasal spray Place 2 sprays into the nose as needed for allergies.       . furosemide (LASIX) 40 MG tablet Take 20 mg by mouth daily as needed (swelling).      . gabapentin (NEURONTIN) 600 MG tablet Take 600 mg by mouth 2 (two) times daily.       . nebivolol (BYSTOLIC) 5 MG tablet Take 5 mg by mouth every morning.      . nitroGLYCERIN (NITROSTAT) 0.4 MG SL tablet Place 1 tablet (0.4 mg total) under the tongue every 5 (five) minutes as needed for chest pain.  25 tablet  3  . nystatin (MYCOSTATIN) 100000 UNIT/ML suspension Take 500,000 Units by mouth 4 (four) times daily.       . Omega-3 Fatty Acids (OMEGA-3 2100 PO) Take 1 capsule by mouth daily.       Earney Navy Bicarbonate (ZEGERID OTC) 20-1100 MG CAPS capsule Take 1 capsule by mouth. PRN      . pantoprazole (PROTONIX) 40 MG tablet Take 1 tablet (40 mg total) by mouth daily.  90 tablet  3  .  pravastatin (PRAVACHOL) 40 MG tablet Take 40 mg by mouth daily.       . Probiotic Product (PROBIOTIC PO) Take 1 capsule by mouth daily.       . promethazine (PHENERGAN) 25 MG tablet Take 25 mg by mouth every 6 (six) hours as needed for nausea.      Marland Kitchen triamcinolone (NASACORT) 55 MCG/ACT nasal inhaler Place 2 sprays into the nose 2 (two) times daily.      . valsartan (DIOVAN) 160 MG tablet Take 160 mg by mouth daily.       Marland Kitchen venlafaxine XR (EFFEXOR-XR) 150 MG 24 hr capsule Take 150 mg by mouth at bedtime.      . vitamin E 1000 UNIT capsule Take 1,000 Units by mouth daily.        No current facility-administered medications for this visit.     Objective: There were no vitals taken for this visit. Patient is alert and in no acute distress. Conjunctiva is pink. Sclera is  nonicteric Oropharyngeal mucosa is normal. No neck masses or thyromegaly noted. Cardiac exam with regular rhythm normal S1 and S2. She has faint SEM at LLSB. Lungs are clear to auscultation. Abdomen is full, soft with mild midepigastric tenderness. No organomegaly or masses noted. No LE edema or clubbing noted.   Assessment:  #1. Irritable bowel syndrome. She is doing better with therapy but having to take multiple medications. #2.GERD. Symptoms are poorly controlled with therapy. Patient should not take PPI in the evening as it would interact with clopidogrel. Her last EGD was in October 2007 was within normal limits. #3. Right upper quadrant abdominal pain. Recent ultrasound was negative for cholelithiasis. She could have been biliary dyskinesia.    Plan: Continue pantoprazole 40 mg by mouth every morning. Discontinue Zegerid OTC. Take famotidine 20 mg by mouth every morning when necessary. New prescription for diphenoxylate 1 tablet 3 times a day when necessary 100 doses issued. HIDA scan with [facility]. Office visit in 6 months.

## 2013-02-13 ENCOUNTER — Encounter (INDEPENDENT_AMBULATORY_CARE_PROVIDER_SITE_OTHER): Payer: Self-pay | Admitting: Internal Medicine

## 2013-02-19 ENCOUNTER — Telehealth (INDEPENDENT_AMBULATORY_CARE_PROVIDER_SITE_OTHER): Payer: Self-pay | Admitting: *Deleted

## 2013-02-19 NOTE — Telephone Encounter (Signed)
Would like to get the results of her test that was done on 02/15/13 at [facility]. The return phone number is [phone]. Jeral Fruit Dr. Laural Golden would call once it has been received and reviewed.

## 2013-02-20 NOTE — Telephone Encounter (Signed)
Patient was called and made aware of the scan results. She was advised that Dr.Rehman would review the result and if there was any further recommendation that either he or myself would call her back.

## 2013-03-21 ENCOUNTER — Other Ambulatory Visit: Payer: Self-pay

## 2013-04-18 ENCOUNTER — Other Ambulatory Visit (INDEPENDENT_AMBULATORY_CARE_PROVIDER_SITE_OTHER): Payer: Self-pay | Admitting: Internal Medicine

## 2013-04-18 NOTE — Telephone Encounter (Signed)
Last prescription for 100 pills was on 02/12/2013

## 2013-04-19 ENCOUNTER — Other Ambulatory Visit (INDEPENDENT_AMBULATORY_CARE_PROVIDER_SITE_OTHER): Payer: Self-pay | Admitting: Internal Medicine

## 2013-04-22 ENCOUNTER — Telehealth (INDEPENDENT_AMBULATORY_CARE_PROVIDER_SITE_OTHER): Payer: Self-pay | Admitting: *Deleted

## 2013-04-22 NOTE — Telephone Encounter (Signed)
The prescription that is in Epic for Lomotil has been called to the [facility] in [place].

## 2013-04-25 ENCOUNTER — Encounter (INDEPENDENT_AMBULATORY_CARE_PROVIDER_SITE_OTHER): Payer: Self-pay

## 2013-06-30 ENCOUNTER — Other Ambulatory Visit (INDEPENDENT_AMBULATORY_CARE_PROVIDER_SITE_OTHER): Payer: Self-pay | Admitting: Internal Medicine

## 2013-07-10 ENCOUNTER — Other Ambulatory Visit (INDEPENDENT_AMBULATORY_CARE_PROVIDER_SITE_OTHER): Payer: Self-pay | Admitting: Internal Medicine

## 2013-08-05 ENCOUNTER — Other Ambulatory Visit (INDEPENDENT_AMBULATORY_CARE_PROVIDER_SITE_OTHER): Payer: Self-pay | Admitting: Internal Medicine

## 2013-08-05 DIAGNOSIS — K589 Irritable bowel syndrome without diarrhea: Secondary | ICD-10-CM

## 2013-08-05 MED ORDER — DICYCLOMINE HCL 20 MG PO TABS: 20.0000 mg | ORAL_TABLET | Freq: Three times a day (TID) | ORAL | Status: DC

## 2013-08-12 ENCOUNTER — Ambulatory Visit (INDEPENDENT_AMBULATORY_CARE_PROVIDER_SITE_OTHER): Payer: Managed Care, Other (non HMO) | Admitting: Internal Medicine

## 2013-08-15 ENCOUNTER — Ambulatory Visit (INDEPENDENT_AMBULATORY_CARE_PROVIDER_SITE_OTHER): Payer: Managed Care, Other (non HMO) | Admitting: Internal Medicine

## 2013-09-03 ENCOUNTER — Ambulatory Visit: Payer: Managed Care, Other (non HMO) | Admitting: Cardiovascular Disease

## 2013-09-11 ENCOUNTER — Other Ambulatory Visit (INDEPENDENT_AMBULATORY_CARE_PROVIDER_SITE_OTHER): Payer: Self-pay | Admitting: Internal Medicine

## 2013-09-12 ENCOUNTER — Other Ambulatory Visit (INDEPENDENT_AMBULATORY_CARE_PROVIDER_SITE_OTHER): Payer: Self-pay | Admitting: Internal Medicine

## 2013-09-12 ENCOUNTER — Ambulatory Visit (INDEPENDENT_AMBULATORY_CARE_PROVIDER_SITE_OTHER): Payer: Medicare Other | Admitting: Internal Medicine

## 2013-09-12 ENCOUNTER — Telehealth (INDEPENDENT_AMBULATORY_CARE_PROVIDER_SITE_OTHER): Payer: Self-pay | Admitting: *Deleted

## 2013-09-12 DIAGNOSIS — R110 Nausea: Secondary | ICD-10-CM

## 2013-09-12 MED ORDER — PROMETHAZINE HCL 25 MG PO TABS: 25.0000 mg | ORAL_TABLET | Freq: Four times a day (QID) | ORAL | Status: DC | PRN

## 2013-09-12 MED ORDER — DIPHENOXYLATE-ATROPINE 2.5-0.025 MG PO TABS: ORAL_TABLET | ORAL | Status: DC

## 2013-09-12 NOTE — Telephone Encounter (Signed)
Sick with Diarrhea and needs something for nausea.

## 2013-09-12 NOTE — Telephone Encounter (Signed)
Rx for phenergan eprescribed to her pharmacy

## 2013-09-20 ENCOUNTER — Ambulatory Visit: Payer: Medicare Other | Admitting: Cardiovascular Disease

## 2013-10-09 ENCOUNTER — Ambulatory Visit: Payer: Medicare Other | Admitting: Cardiovascular Disease

## 2013-11-11 ENCOUNTER — Encounter: Payer: Self-pay | Admitting: Cardiovascular Disease

## 2013-11-11 ENCOUNTER — Other Ambulatory Visit: Payer: Self-pay | Admitting: *Deleted

## 2013-11-11 ENCOUNTER — Ambulatory Visit (INDEPENDENT_AMBULATORY_CARE_PROVIDER_SITE_OTHER): Payer: Medicare Other | Admitting: Cardiovascular Disease

## 2013-11-11 VITALS — BP 119/81 | HR 76 | Ht 62.0 in | Wt 139.0 lb

## 2013-11-11 DIAGNOSIS — Z79899 Other long term (current) drug therapy: Secondary | ICD-10-CM

## 2013-11-11 DIAGNOSIS — I714 Abdominal aortic aneurysm, without rupture, unspecified: Secondary | ICD-10-CM

## 2013-11-11 DIAGNOSIS — I10 Essential (primary) hypertension: Secondary | ICD-10-CM

## 2013-11-11 DIAGNOSIS — E785 Hyperlipidemia, unspecified: Secondary | ICD-10-CM

## 2013-11-11 DIAGNOSIS — I2510 Atherosclerotic heart disease of native coronary artery without angina pectoris: Secondary | ICD-10-CM

## 2013-11-11 MED ORDER — NEBIVOLOL HCL 5 MG PO TABS: 5.0000 mg | ORAL_TABLET | ORAL | Status: DC

## 2013-11-11 MED ORDER — CLOPIDOGREL BISULFATE 75 MG PO TABS: 75.0000 mg | ORAL_TABLET | Freq: Every day | ORAL | Status: DC

## 2013-11-11 MED ORDER — FUROSEMIDE 40 MG PO TABS: 40.0000 mg | ORAL_TABLET | Freq: Every day | ORAL | Status: AC | PRN

## 2013-11-11 NOTE — Patient Instructions (Signed)
Your physician recommends that you schedule a follow-up appointment in: 6 months. You will receive a reminder letter in the mail in about 4 months reminding you to call and schedule your appointment. If you don't receive this letter, please contact our office. Your physician recommends that you continue on your current medications as directed. Please refer to the Current Medication list given to you today. Your physician recommends that you have a FASTING lipid/liver profile this week. Please do not eat or drink for at least 8 hours when you have this done.

## 2013-11-11 NOTE — Progress Notes (Signed)
Patient ID: [name] [name], female   DOB: 12-Sep-1950, 63 y.o.   MRN: [id number]      SUBJECTIVE: The patient is a 63 year old woman with a past medical history significant for coronary artery disease and prior stent placement to the LAD and RCA, abdominal aortic aneurysm, hypertension, hyperlipidemia, breast cancer, and gastroesophageal reflux disease. Coronary angiography in September 2014 demonstrated mild in-stent restenosis in the LAD and RCA. AAA measured 2.9 x 2.9 cm in September 2014 and she is followed by [facility].  She has been doing well and denies exertional chest pain and dyspnea. She also denies palpitations and leg swelling and only takes Lasix as needed.    Allergies  Allergen Reactions  . Oxycodone Hives    Itching  . Amlodipine Besylate Hives  . Doxazosin Mesylate Hives  . Erythromycin Hives  . Lisinopril Hives    REACTION: causes hives  . Metoprolol Tartrate Hives    REACTION: causes hives  . Penicillins Hives  . Prednisone     Patient states that it caused bad abdominal pain    Current Outpatient Prescriptions  Medication Sig Dispense Refill  . acetaminophen-codeine (TYLENOL #3) 300-30 MG per tablet Take 1 tablet by mouth every 6 (six) hours as needed for pain.      Marland Kitchen aspirin EC 81 MG tablet Take 1 tablet (81 mg total) by mouth daily.      . carisoprodol (SOMA) 350 MG tablet Take 350 mg by mouth at bedtime as needed for muscle spasms.       . cetirizine (ZYRTEC) 10 MG tablet Take 10 mg by mouth daily.      . Cetirizine-Pseudoephedrine (ZYRTEC-D ALLERGY & CONGESTION PO) Take by mouth daily.      . Cholecalciferol (VITAMIN D3) 3000 UNITS TABS Take 1,000 Units by mouth.      . clopidogrel (PLAVIX) 75 MG tablet Take 1 tablet (75 mg total) by mouth daily.  30 tablet  6  . diazepam (VALIUM) 5 MG tablet Take 1 tablet by mouth every morning. And 2 tablets at night      . dicyclomine (BENTYL) 20 MG tablet Take 1 tablet (20 mg total) by mouth 4 (four) times daily - after meals  and at bedtime.  120 tablet  4  . diphenoxylate-atropine (LOMOTIL) 2.5-0.025 MG per tablet TAKE ONE TABLET BY MOUTH 4 TIMES DAILY AS NEEDED FOR DIARRHEA OR LOOSE STOOLS  100 tablet  0  . Famotidine 20 MG CHEW Chew 1 tablet (20 mg total) by mouth daily as needed.  30 each    . fluticasone (FLONASE) 50 MCG/ACT nasal spray Place 2 sprays into the nose as needed for allergies.       . furosemide (LASIX) 40 MG tablet Take 40 mg by mouth daily as needed (swelling).       . gabapentin (NEURONTIN) 600 MG tablet Take 600 mg by mouth 2 (two) times daily.       . nebivolol (BYSTOLIC) 5 MG tablet Take 5 mg by mouth every morning.      . nitroGLYCERIN (NITROSTAT) 0.4 MG SL tablet Place 1 tablet (0.4 mg total) under the tongue every 5 (five) minutes as needed for chest pain.  25 tablet  3  . nystatin (MYCOSTATIN) 100000 UNIT/ML suspension Take 500,000 Units by mouth 4 (four) times daily.       . Omega-3 Fatty Acids (OMEGA-3 2100 PO) Take 1 capsule by mouth daily.       . pantoprazole (PROTONIX) 40 MG tablet TAKE  ONE TABLET BY MOUTH ONCE DAILY  90 tablet  0  . pravastatin (PRAVACHOL) 40 MG tablet Take 40 mg by mouth daily.       . Probiotic Product (PROBIOTIC PO) Take 1 capsule by mouth daily.       . promethazine (PHENERGAN) 25 MG tablet Take 1 tablet (25 mg total) by mouth every 6 (six) hours as needed for nausea.  30 tablet  0  . triamcinolone (NASACORT) 55 MCG/ACT nasal inhaler Place 2 sprays into the nose 2 (two) times daily.      . valsartan (DIOVAN) 160 MG tablet Take 160 mg by mouth daily.       Marland Kitchen venlafaxine XR (EFFEXOR-XR) 150 MG 24 hr capsule Take 150 mg by mouth at bedtime.      . vitamin E 1000 UNIT capsule Take 1,000 Units by mouth daily.        No current facility-administered medications for this visit.    Past Medical History  Diagnosis Date  . GERD (gastroesophageal reflux disease)   . Abdominal cramping 09/21/2010  . Chronic diarrhea   . Coronary artery disease     Status post drug  eluding stent to the right coronary artery status post in-stent restenosis.  DAPT  . AAA (abdominal aortic aneurysm)   . Cancer 2010    right breast   mastectomy and cheotherapy  . IBS (irritable bowel syndrome)   . Diverticulosis   . Aortic aneurysm, abdominal   . Hypertension   . Arthritis   . Anxiety   . Depression   . Hyperlipidemia     Past Surgical History  Procedure Laterality Date  . Mastectomy  05/27/2008    RIGHT  . Small bowel givens  01/06/2009  . Ct angiography  11/02/2010    ABD &PELV  . Coronary angioplasty with stent placement  2000-2010    3 cardiac stent splaced per patient  . Appendectomy  1995  . Colonoscopy    . Cervical fusion      x6  . Cataract extraction w/phaco Left 07/19/2012    Procedure: CATARACT EXTRACTION PHACO AND INTRAOCULAR LENS PLACEMENT (IOC);  Surgeon: Tonny Branch, MD;  Location: [facility];  Service: Ophthalmology;  Laterality: Left;  CDE:9.95    History   Social History  . Marital Status: Married    Spouse Name: N/A    Number of Children: N/A  . Years of Education: N/A   Occupational History  . Not on file.   Social History Main Topics  . Smoking status: Current Every Day Smoker -- 1.00 packs/day for 45 years    Types: Cigarettes    Start date: 06/04/1965  . Smokeless tobacco: Never Used     Comment: using e cigs  . Alcohol Use: No  . Drug Use: No  . Sexual Activity: Not on file   Other Topics Concern  . Not on file   Social History Narrative  . No narrative on file     Filed Vitals:   11/11/13 1552  BP: 119/81  Pulse: 76  Height: 5\' 2"  (1.575 m)  Weight: 139 lb (63.05 kg)  SpO2: 96%    PHYSICAL EXAM General: NAD Neck: No JVD, no thyromegaly. Lungs: Clear to auscultation bilaterally with normal respiratory effort. CV: Nondisplaced PMI.  Regular rate and rhythm, normal S1/S2, no S3/S4, no murmur. No pretibial or periankle edema.  No carotid bruit.  Normal pedal pulses.  Abdomen: Soft, nontender, no  hepatosplenomegaly, no distention.  Neurologic: Alert and oriented  x 3.  Psych: Normal affect. Extremities: No clubbing or cyanosis.   ECG: reviewed and available in electronic records.      ASSESSMENT AND PLAN: 1. CAD with prior stenting of LAD and RCA with mild instent restenosis in 01/2013: Symptomatically stable. Continue aspirin 81 mg daily, Plavix 75 mg daily, diastolic 5 mg daily, and pravastatin 40 mg daily. 2. HTN: Well controlled on current therapy which includes valsartan 160 mg daily and Bystolic 5 mg daily. 3. Hyperlipidemia: Will check lipids and LFTs. For the time being continue pravastatin 40 mg daily. 4. AAA: Followed by [facility]. 2.9 cm x 2.9 cm in 01/2013.  Dispo: f/u 6 months.  Kate Sable, M.D., F.A.C.C.

## 2013-11-14 ENCOUNTER — Ambulatory Visit (INDEPENDENT_AMBULATORY_CARE_PROVIDER_SITE_OTHER): Payer: Medicare Other | Admitting: Internal Medicine

## 2014-01-22 ENCOUNTER — Ambulatory Visit (INDEPENDENT_AMBULATORY_CARE_PROVIDER_SITE_OTHER): Payer: Medicare Other | Admitting: Internal Medicine

## 2014-01-29 ENCOUNTER — Encounter (INDEPENDENT_AMBULATORY_CARE_PROVIDER_SITE_OTHER): Payer: Self-pay | Admitting: Internal Medicine

## 2014-01-29 ENCOUNTER — Ambulatory Visit (INDEPENDENT_AMBULATORY_CARE_PROVIDER_SITE_OTHER): Payer: Medicare Other | Admitting: Internal Medicine

## 2014-01-29 VITALS — BP 138/90 | HR 84 | Temp 98.4°F | Ht 63.0 in | Wt 132.3 lb

## 2014-01-29 DIAGNOSIS — K589 Irritable bowel syndrome without diarrhea: Secondary | ICD-10-CM

## 2014-01-29 DIAGNOSIS — I2510 Atherosclerotic heart disease of native coronary artery without angina pectoris: Secondary | ICD-10-CM

## 2014-01-29 MED ORDER — DIPHENOXYLATE-ATROPINE 2.5-0.025 MG PO TABS: ORAL_TABLET | ORAL | Status: DC

## 2014-01-29 MED ORDER — DICYCLOMINE HCL 20 MG PO TABS: 20.0000 mg | ORAL_TABLET | Freq: Three times a day (TID) | ORAL | Status: DC

## 2014-01-29 NOTE — Progress Notes (Signed)
Subjective:    Patient ID: [name] [name], female    DOB: 1950/05/21, 63 y.o.   MRN: [id number] HPIPresents today with c/o abdominal cramps. She has a hx of IBS. She says she has been hurting for months. States she has been passing mucous and blood in her stools.  She also c/o lower abdominal pain.  She is having anywhere from 3 stools a day. Her stools are not formed.  Her stools are very soft.  She does have a hx of constipation.  She occasionally has urgency and incontinence. She has seen some mucous in her stool. Her appetite has remained good. No weight loss.  She usually has diarrhea about once a week.     12/31/2008 Colonoscopy: A few scattered diverticula at sigmoid and descending colon, otherwise, normal exam. No lesion found on this exam to acct for patient's iron deficiency anemia, although her bleeding may have been secondary to diverticular bleed.      Review of Systems Past Medical History  Diagnosis Date  . GERD (gastroesophageal reflux disease)   . Abdominal cramping 09/21/2010  . Chronic diarrhea   . Coronary artery disease     Status post drug eluding stent to the right coronary artery status post in-stent restenosis.  DAPT  . AAA (abdominal aortic aneurysm)   . Cancer 2010    right breast   mastectomy and cheotherapy  . IBS (irritable bowel syndrome)   . Diverticulosis   . Aortic aneurysm, abdominal   . Hypertension   . Arthritis   . Anxiety   . Depression   . Hyperlipidemia     Past Surgical History  Procedure Laterality Date  . Mastectomy  05/27/2008    RIGHT  . Small bowel givens  01/06/2009  . Ct angiography  11/02/2010    ABD &PELV  . Coronary angioplasty with stent placement  2000-2010    3 cardiac stent splaced per patient  . Appendectomy  1995  . Colonoscopy    . Cervical fusion      x6  . Cataract extraction w/phaco Left 07/19/2012    Procedure: CATARACT EXTRACTION PHACO AND INTRAOCULAR LENS PLACEMENT (IOC);  Surgeon: Tonny Branch, MD;   Location: [facility];  Service: Ophthalmology;  Laterality: Left;  CDE:9.95    Allergies  Allergen Reactions  . Oxycodone Hives    Itching  . Amlodipine Besylate Hives  . Doxazosin Mesylate Hives  . Erythromycin Hives  . Lisinopril Hives    REACTION: causes hives  . Metoprolol Tartrate Hives    REACTION: causes hives  . Penicillins Hives  . Prednisone     Patient states that it caused bad abdominal pain    Current Outpatient Prescriptions on File Prior to Visit  Medication Sig Dispense Refill  . aspirin EC 81 MG tablet Take 1 tablet (81 mg total) by mouth daily.      . carisoprodol (SOMA) 350 MG tablet Take 350 mg by mouth at bedtime as needed for muscle spasms.       . cetirizine (ZYRTEC) 10 MG tablet Take 10 mg by mouth daily.      . Cetirizine-Pseudoephedrine (ZYRTEC-D ALLERGY & CONGESTION PO) Take by mouth daily.      . Cholecalciferol (VITAMIN D3) 3000 UNITS TABS Take 1,000 Units by mouth.      . clopidogrel (PLAVIX) 75 MG tablet Take 1 tablet (75 mg total) by mouth daily.  30 tablet  6  . diazepam (VALIUM) 5 MG tablet Take 1 tablet by mouth  every morning. And 2 tablets at night      . Famotidine 20 MG CHEW Chew 1 tablet (20 mg total) by mouth daily as needed.  30 each    . fluticasone (FLONASE) 50 MCG/ACT nasal spray Place 2 sprays into the nose as needed for allergies.       . furosemide (LASIX) 40 MG tablet Take 1 tablet (40 mg total) by mouth daily as needed (swelling).  30 tablet  6  . gabapentin (NEURONTIN) 600 MG tablet Take 600 mg by mouth 2 (two) times daily.       . nebivolol (BYSTOLIC) 5 MG tablet Take 1 tablet (5 mg total) by mouth every morning.  30 tablet  6  . nitroGLYCERIN (NITROSTAT) 0.4 MG SL tablet Place 1 tablet (0.4 mg total) under the tongue every 5 (five) minutes as needed for chest pain.  25 tablet  3  . nystatin (MYCOSTATIN) 100000 UNIT/ML suspension Take 500,000 Units by mouth 4 (four) times daily.       . Omega-3 Fatty Acids (OMEGA-3 2100 PO) Take 1  capsule by mouth daily.       . pravastatin (PRAVACHOL) 40 MG tablet Take 40 mg by mouth daily.       . Probiotic Product (PROBIOTIC PO) Take 1 capsule by mouth daily.       . promethazine (PHENERGAN) 25 MG tablet Take 1 tablet (25 mg total) by mouth every 6 (six) hours as needed for nausea.  30 tablet  0  . triamcinolone (NASACORT) 55 MCG/ACT nasal inhaler Place 2 sprays into the nose 2 (two) times daily.      . valsartan (DIOVAN) 160 MG tablet Take 160 mg by mouth daily.       Marland Kitchen venlafaxine XR (EFFEXOR-XR) 150 MG 24 hr capsule Take 150 mg by mouth at bedtime.      . vitamin E 1000 UNIT capsule Take 1,000 Units by mouth daily.        No current facility-administered medications on file prior to visit.        Objective:   Physical Exam  Filed Vitals:   01/29/14 1530  BP: 138/90  Pulse: 84  Temp: 98.4 F (36.9 C)  Height: 5\' 3"  (1.6 m)  Weight: 132 lb 4.8 oz (60.011 kg)   Alert and oriented. Skin warm and dry. Oral mucosa is moist.   . Sclera anicteric, conjunctivae is pink. Thyroid not enlarged. No cervical lymphadenopathy. Lungs clear. Heart regular rate and rhythm.  Abdomen is soft. Bowel sounds are positive. No hepatomegaly. No abdominal masses felt. No tenderness.  No edema to lower extremities.  Stool brown and guaiac negative.     Assessment & Plan:  Probably IBS. Rx for Dicyclomine renewed as well as Lomotil. IBARD samples x4 given to patient.

## 2014-01-29 NOTE — Patient Instructions (Signed)
Samples of IBARD to see if  It helps. OV in 6 weeks.

## 2014-02-10 ENCOUNTER — Encounter: Payer: Self-pay | Admitting: Family

## 2014-02-11 ENCOUNTER — Other Ambulatory Visit (HOSPITAL_COMMUNITY): Payer: Managed Care, Other (non HMO)

## 2014-02-11 ENCOUNTER — Ambulatory Visit: Payer: Managed Care, Other (non HMO) | Admitting: Family

## 2014-02-27 ENCOUNTER — Encounter: Payer: Self-pay | Admitting: Family

## 2014-02-28 ENCOUNTER — Other Ambulatory Visit (HOSPITAL_COMMUNITY): Payer: Medicare Other

## 2014-02-28 ENCOUNTER — Ambulatory Visit: Payer: Medicare Other | Admitting: Family

## 2014-03-05 ENCOUNTER — Encounter: Payer: Self-pay | Admitting: Family

## 2014-03-06 ENCOUNTER — Other Ambulatory Visit (HOSPITAL_COMMUNITY): Payer: Medicare Other

## 2014-03-06 ENCOUNTER — Ambulatory Visit: Payer: Medicare Other | Admitting: Family

## 2014-03-20 ENCOUNTER — Other Ambulatory Visit (HOSPITAL_COMMUNITY): Payer: Medicare Other

## 2014-03-20 ENCOUNTER — Ambulatory Visit: Payer: Medicare Other | Admitting: Family

## 2014-03-27 ENCOUNTER — Encounter: Payer: Self-pay | Admitting: Family

## 2014-03-28 ENCOUNTER — Other Ambulatory Visit (HOSPITAL_COMMUNITY): Payer: Medicare Other

## 2014-03-28 ENCOUNTER — Ambulatory Visit: Payer: Medicare Other | Admitting: Family

## 2014-04-14 ENCOUNTER — Emergency Department (HOSPITAL_COMMUNITY)
Admission: EM | Admit: 2014-04-14 | Discharge: 2014-04-14 | Disposition: A | Discharge status: Home / Self Care | Payer: Medicare Other | Attending: Emergency Medicine | Admitting: Emergency Medicine

## 2014-04-14 ENCOUNTER — Emergency Department (HOSPITAL_COMMUNITY): Payer: Medicare Other

## 2014-04-14 ENCOUNTER — Encounter (HOSPITAL_COMMUNITY): Payer: Self-pay | Admitting: *Deleted

## 2014-04-14 DIAGNOSIS — Z9089 Acquired absence of other organs: Secondary | ICD-10-CM | POA: Diagnosis not present

## 2014-04-14 DIAGNOSIS — Z880 Allergy status to penicillin: Secondary | ICD-10-CM | POA: Diagnosis not present

## 2014-04-14 DIAGNOSIS — Z9861 Coronary angioplasty status: Secondary | ICD-10-CM | POA: Diagnosis not present

## 2014-04-14 DIAGNOSIS — E785 Hyperlipidemia, unspecified: Secondary | ICD-10-CM | POA: Insufficient documentation

## 2014-04-14 DIAGNOSIS — Z7982 Long term (current) use of aspirin: Secondary | ICD-10-CM | POA: Insufficient documentation

## 2014-04-14 DIAGNOSIS — F329 Major depressive disorder, single episode, unspecified: Secondary | ICD-10-CM | POA: Diagnosis not present

## 2014-04-14 DIAGNOSIS — Z720 Tobacco use: Secondary | ICD-10-CM | POA: Insufficient documentation

## 2014-04-14 DIAGNOSIS — Z7902 Long term (current) use of antithrombotics/antiplatelets: Secondary | ICD-10-CM | POA: Insufficient documentation

## 2014-04-14 DIAGNOSIS — Z7952 Long term (current) use of systemic steroids: Secondary | ICD-10-CM | POA: Diagnosis not present

## 2014-04-14 DIAGNOSIS — Z8719 Personal history of other diseases of the digestive system: Secondary | ICD-10-CM | POA: Diagnosis not present

## 2014-04-14 DIAGNOSIS — Z9889 Other specified postprocedural states: Secondary | ICD-10-CM | POA: Insufficient documentation

## 2014-04-14 DIAGNOSIS — R52 Pain, unspecified: Secondary | ICD-10-CM

## 2014-04-14 DIAGNOSIS — F419 Anxiety disorder, unspecified: Secondary | ICD-10-CM | POA: Insufficient documentation

## 2014-04-14 DIAGNOSIS — R0789 Other chest pain: Secondary | ICD-10-CM

## 2014-04-14 DIAGNOSIS — I10 Essential (primary) hypertension: Secondary | ICD-10-CM | POA: Insufficient documentation

## 2014-04-14 DIAGNOSIS — M1990 Unspecified osteoarthritis, unspecified site: Secondary | ICD-10-CM | POA: Diagnosis not present

## 2014-04-14 DIAGNOSIS — Z79899 Other long term (current) drug therapy: Secondary | ICD-10-CM | POA: Insufficient documentation

## 2014-04-14 DIAGNOSIS — I2510 Atherosclerotic heart disease of native coronary artery without angina pectoris: Secondary | ICD-10-CM | POA: Diagnosis not present

## 2014-04-14 DIAGNOSIS — R109 Unspecified abdominal pain: Secondary | ICD-10-CM | POA: Diagnosis not present

## 2014-04-14 DIAGNOSIS — R079 Chest pain, unspecified: Secondary | ICD-10-CM | POA: Diagnosis present

## 2014-04-14 LAB — COMPREHENSIVE METABOLIC PANEL
ALT: 6 U/L (ref 0–35)
AST: 13 U/L (ref 0–37)
Albumin: 3.7 g/dL (ref 3.5–5.2)
Alkaline Phosphatase: 105 U/L (ref 39–117)
Anion gap: 12 (ref 5–15)
BUN: 13 mg/dL (ref 6–23)
CO2: 30 meq/L (ref 19–32)
CREATININE: 1.05 mg/dL (ref 0.50–1.10)
Calcium: 10.4 mg/dL (ref 8.4–10.5)
Chloride: 99 mEq/L (ref 96–112)
GFR calc non Af Amer: 55 mL/min — ABNORMAL LOW (ref >90)
GFR, EST AFRICAN AMERICAN: 63 mL/min — AB (ref >90)
GLUCOSE: 92 mg/dL (ref 70–99)
Potassium: 4.5 mEq/L (ref 3.7–5.3)
Sodium: 141 mEq/L (ref 137–147)
TOTAL PROTEIN: 7.5 g/dL (ref 6.0–8.3)
Total Bilirubin: 0.2 mg/dL — ABNORMAL LOW (ref 0.3–1.2)

## 2014-04-14 LAB — CBC
HCT: 43.8 % (ref 36.0–46.0)
HEMOGLOBIN: 14.8 g/dL (ref 12.0–15.0)
MCH: 32 pg (ref 26.0–34.0)
MCHC: 33.8 g/dL (ref 30.0–36.0)
MCV: 94.6 fL (ref 78.0–100.0)
PLATELETS: 273 10*3/uL (ref 150–400)
RBC: 4.63 MIL/uL (ref 3.87–5.11)
RDW: 14.4 % (ref 11.5–15.5)
WBC: 7.7 10*3/uL (ref 4.0–10.5)

## 2014-04-14 LAB — TROPONIN I: Troponin I: <0.3 ng/mL (ref <0.30)

## 2014-04-14 LAB — LIPASE, BLOOD: LIPASE: 21 U/L (ref 11–59)

## 2014-04-14 MED ORDER — GI COCKTAIL ~~LOC~~
30.0000 mL | Freq: Once | ORAL | Status: AC
  Administered 2014-04-14: 30 mL via ORAL
  Filled 2014-04-14: qty 30

## 2014-04-14 MED ORDER — FAMOTIDINE 20 MG PO TABS
20.0000 mg | ORAL_TABLET | Freq: Once | ORAL | Status: AC
  Administered 2014-04-14: 20 mg via ORAL
  Filled 2014-04-14: qty 1

## 2014-04-14 MED ORDER — FAMOTIDINE 20 MG PO TABS: 20.0000 mg | ORAL_TABLET | Freq: Two times a day (BID) | ORAL | Status: DC

## 2014-04-14 MED ORDER — HEPARIN SOD (PORK) LOCK FLUSH 100 UNIT/ML IV SOLN
INTRAVENOUS | Status: AC
  Administered 2014-04-14: 21:00:00
  Filled 2014-04-14: qty 5

## 2014-04-14 MED ORDER — HYDROMORPHONE HCL 1 MG/ML IJ SOLN
0.5000 mg | Freq: Once | INTRAMUSCULAR | Status: AC
Start: 2014-04-14 — End: 2014-04-14
  Administered 2014-04-14: 0.5 mg via INTRAVENOUS
  Filled 2014-04-14: qty 1

## 2014-04-14 MED ORDER — SODIUM CHLORIDE 0.9 % IV SOLN
Freq: Once | INTRAVENOUS | Status: AC
  Administered 2014-04-14: 16:00:00 via INTRAVENOUS

## 2014-04-14 NOTE — Discharge Instructions (Signed)
As discussed, your evaluation today has been largely reassuring.  But, it is important that you monitor your condition carefully, and do not hesitate to return to the ED if you develop new, or concerning changes in your condition.  Otherwise, please follow-up with your physician for appropriate ongoing care.  Please be sure to discuss appropriate medications for your pain.

## 2014-04-14 NOTE — ED Provider Notes (Addendum)
CSN: [id number]     Arrival date & time 04/14/14  1344 History  This chart was scribed for Carmin Muskrat, MD by Stephania Fragmin, ED Scribe. This patient was seen in room [facility] and the patient's care was started at 3:19 PM.   Chief Complaint  Patient presents with  . Chest Pain   The history is provided by the patient. No language interpreter was used.     HPI Comments: [name] [name] is a 63 y.o. female who presents to the Emergency Department complaining of intermittent, center chest pain around her breast bone radiating to her back that started last night. Patient reports that her blood pressure has been high, and was once measured at 191/121. She has taken NTG and baby aspirin PTA that somewhat relieved her symptoms. She had also been given a shot of Toradol here that did not relieve her symptoms. Patient has a history of IBS and diverticulitis (for which Levaquin had cleared up in 2 days), a slipped disc in her neck, and an aortic aneurysm measured at 3.3 cm last September. Patient denies cough, fever, vomiting, nausea, weakness, lightheadedness, confusion, syncope, or acute blood in her stool or urine.  Patient is a smoker but is trying to quit.  Past Medical History  Diagnosis Date  . GERD (gastroesophageal reflux disease)   . Abdominal cramping 09/21/2010  . Chronic diarrhea   . Coronary artery disease     Status post drug eluding stent to the right coronary artery status post in-stent restenosis.  DAPT  . AAA (abdominal aortic aneurysm)   . Cancer 2010    right breast   mastectomy and cheotherapy  . IBS (irritable bowel syndrome)   . Diverticulosis   . Aortic aneurysm, abdominal   . Hypertension   . Arthritis   . Anxiety   . Depression   . Hyperlipidemia    Past Surgical History  Procedure Laterality Date  . Mastectomy  05/27/2008    RIGHT  . Small bowel givens  01/06/2009  . Ct angiography  11/02/2010    ABD &PELV  . Coronary angioplasty with stent placement   2000-2010    3 cardiac stent splaced per patient  . Appendectomy  1995  . Colonoscopy    . Cervical fusion      x6  . Cataract extraction w/phaco Left 07/19/2012    Procedure: CATARACT EXTRACTION PHACO AND INTRAOCULAR LENS PLACEMENT (IOC);  Surgeon: Tonny Branch, MD;  Location: [facility];  Service: Ophthalmology;  Laterality: Left;  CDE:9.95   Family History  Problem Relation Age of Onset  . Diverticulitis Mother   . Heart disease Father   . Aortic aneurysm Father   . Heart disease Brother   . Aortic aneurysm Brother   . Crohn's disease Brother    History  Substance Use Topics  . Smoking status: Current Every Day Smoker -- 1.00 packs/day for 45 years    Types: Cigarettes    Start date: 06/04/1965  . Smokeless tobacco: Never Used     Comment: using e cigs, but she does smoke  . Alcohol Use: No   OB History    No data available     Review of Systems  Constitutional: Negative for fever.       Per HPI, otherwise negative  HENT:       Per HPI, otherwise negative  Respiratory: Negative for cough.        Per HPI, otherwise negative  Cardiovascular: Positive for chest pain.  Per HPI, otherwise negative  Gastrointestinal: Negative for nausea, vomiting and diarrhea.  Endocrine:       Negative aside from HPI  Genitourinary: Negative for hematuria.       Neg aside from HPI   Musculoskeletal:       Per HPI, otherwise negative  Skin: Negative.   Neurological: Negative for syncope, weakness and light-headedness.  Psychiatric/Behavioral: Negative for confusion.      Allergies  Oxycodone; Amlodipine besylate; Doxazosin mesylate; Erythromycin; Lisinopril; Metoprolol tartrate; Penicillins; and Prednisone  Home Medications   Prior to Admission medications   Medication Sig Start Date End Date Taking? Authorizing Provider  aspirin EC 81 MG tablet Take 1 tablet (81 mg total) by mouth daily. 01/09/13   Donney Dice, PA-C  carisoprodol (SOMA) 350 MG tablet Take 350 mg by mouth at  bedtime as needed for muscle spasms.     Historical Provider, MD  cetirizine (ZYRTEC) 10 MG tablet Take 10 mg by mouth daily.    Historical Provider, MD  Cetirizine-Pseudoephedrine (ZYRTEC-D ALLERGY & CONGESTION PO) Take by mouth daily.    Historical Provider, MD  Cholecalciferol (VITAMIN D3) 3000 UNITS TABS Take 1,000 Units by mouth.    Historical Provider, MD  clopidogrel (PLAVIX) 75 MG tablet Take 1 tablet (75 mg total) by mouth daily. 11/11/13   Herminio Commons, MD  diazepam (VALIUM) 5 MG tablet Take 1 tablet by mouth every morning. And 2 tablets at night 04/11/12   Historical Provider, MD  dicyclomine (BENTYL) 20 MG tablet Take 1 tablet (20 mg total) by mouth 4 (four) times daily - after meals and at bedtime. 01/29/14   Butch Penny, NP  diphenoxylate-atropine (LOMOTIL) 2.5-0.025 MG per tablet TAKE ONE TABLET BY MOUTH 4 TIMES DAILY AS NEEDED FOR DIARRHEA OR LOOSE STOOLS 01/29/14   Butch Penny, NP  Famotidine 20 MG CHEW Chew 1 tablet (20 mg total) by mouth daily as needed. 02/12/13   Rogene Houston, MD  fluticasone (FLONASE) 50 MCG/ACT nasal spray Place 2 sprays into the nose as needed for allergies.  12/01/11   Historical Provider, MD  furosemide (LASIX) 40 MG tablet Take 1 tablet (40 mg total) by mouth daily as needed (swelling). 11/11/13   Herminio Commons, MD  gabapentin (NEURONTIN) 600 MG tablet Take 600 mg by mouth 2 (two) times daily.  12/09/10   Historical Provider, MD  nebivolol (BYSTOLIC) 5 MG tablet Take 1 tablet (5 mg total) by mouth every morning. 11/11/13   Herminio Commons, MD  nitroGLYCERIN (NITROSTAT) 0.4 MG SL tablet Place 1 tablet (0.4 mg total) under the tongue every 5 (five) minutes as needed for chest pain. 01/09/13   Donney Dice, PA-C  nystatin (MYCOSTATIN) 100000 UNIT/ML suspension Take 500,000 Units by mouth 4 (four) times daily.  12/23/12   Historical Provider, MD  Omega-3 Fatty Acids (OMEGA-3 2100 PO) Take 1 capsule by mouth daily.     Historical Provider, MD   pravastatin (PRAVACHOL) 40 MG tablet Take 40 mg by mouth daily.  05/02/11   Ezra Sites, MD  Probiotic Product (PROBIOTIC PO) Take 1 capsule by mouth daily.     Historical Provider, MD  promethazine (PHENERGAN) 25 MG tablet Take 1 tablet (25 mg total) by mouth every 6 (six) hours as needed for nausea. 09/12/13   Butch Penny, NP  triamcinolone (NASACORT) 55 MCG/ACT nasal inhaler Place 2 sprays into the nose 2 (two) times daily.    Historical Provider, MD  valsartan (  DIOVAN) 160 MG tablet Take 160 mg by mouth daily.     Historical Provider, MD  venlafaxine XR (EFFEXOR-XR) 150 MG 24 hr capsule Take 150 mg by mouth at bedtime.    Historical Provider, MD  vitamin E 1000 UNIT capsule Take 1,000 Units by mouth daily.     Historical Provider, MD   BP 184/103 mmHg  Pulse 68  Temp(Src) 98.9 F (37.2 C) (Oral)  Resp 16  Ht 5\' 3"  (1.6 m)  Wt 135 lb (61.236 kg)  BMI 23.92 kg/m2  SpO2 100% Physical Exam  Constitutional: She is oriented to person, place, and time. She appears well-developed and well-nourished. No distress.  HENT:  Head: Normocephalic and atraumatic.  Eyes: Conjunctivae and EOM are normal.  Cardiovascular: Normal rate, regular rhythm and normal heart sounds.   Pulmonary/Chest: Effort normal and breath sounds normal. No stridor. No respiratory distress.  Abdominal: Soft. Bowel sounds are normal. She exhibits no distension. There is no tenderness.  Non-peritoneal.  No pulsile mass.  Musculoskeletal: She exhibits no edema.  Neurological: She is alert and oriented to person, place, and time. No cranial nerve deficit.  Skin: Skin is warm and dry.  Psychiatric: She has a normal mood and affect.  Nursing note and vitals reviewed.   ED Course  Procedures (including critical care time)  DIAGNOSTIC STUDIES: Oxygen Saturation is 100% on room air, normal by my interpretation.    COORDINATION OF CARE: 3:26 PM - Discussed treatment plan with pt at bedside and pt agreed to plan.    Labs Review Labs Reviewed  TROPONIN I  COMPREHENSIVE METABOLIC PANEL  CBC    Imaging Review Dg Chest 2 View  04/14/2014   CLINICAL DATA:  Centralized chest pain radiating posteriorly since last night. History of hypertension and smoking. History of right-sided breast cancer (post right-sided mastectomy in 2010).  EXAM: CHEST  2 VIEW  COMPARISON:  Chest radiograph- 01/09/2013; chest CT - 07/21/2012  FINDINGS: Grossly unchanged borderline enlarged cardiac silhouette and mediastinal contours. Post coronary artery stent placement. Stable position of support apparatus. The lungs remain hyperexpanded with flattening of the bilateral hemidiaphragms. No focal airspace opacities. No pleural effusion or pneumothorax. No evidence of edema. Post right-sided mastectomy and right axillary lymph node dissection. Post lower cervical ACDF. Mild scoliotic curvature of the thoracolumbar spine.  IMPRESSION: 1. Hyperexpanded lungs without acute cardiopulmonary disease. 2. Post coronary artery stent placement 3. Post right-sided mastectomy and right axillary lymph node dissection.   Electronically Signed   By: Sandi Mariscal M.D.   On: 04/14/2014 14:32     EKG Interpretation   Date/Time:  Monday April 14 2014 13:49:50 EST Ventricular Rate:  70 PR Interval:  160 QRS Duration: 94 QT Interval:  394 QTC Calculation: 425 R Axis:   65 Text Interpretation:  Normal sinus rhythm Possible Left atrial enlargement  Borderline ECG Sinus rhythm Artifact No significant change since last  tracing Abnormal ekg Confirmed by Carmin Muskrat  MD [phone]) on  04/14/2014 3:39:51 PM       5:41 PM Patient sitting up, eating. She still has mild discomfort, denies substantial pain. GI cocktail improved Sx.  8:09 PM Patient asymptomatic   Date: 04/14/2014  Rate: 68  Rhythm: normal sinus rhythm  QRS Axis: normal  Intervals: normal  ST/T Wave abnormalities: normal  Conduction Disutrbances:artefact  Narrative  Interpretation:   Old EKG Reviewed: unchanged  Patient is feeling better.  She will follow up with GI tomorrow, as scheduled.    MDM  Final diagnoses:  Pain   patient presents with sternal pain.  Given the patient's age, risk factors, she was monitored for several hours, with serial troponins, EKG.  Patient's evaluation was largely reassuring, with low suspicion for ongoing ACS.  No e/o progression of AAA Patient had most improvement following GI cocktail.  Patient was discharged in stable condition to follow up with her neurologist tomorrow, and primary care physician for additional evaluation and management.  I personally performed the services described in this documentation, which was scribed in my presence. The recorded information has been reviewed and is accurate.    Carmin Muskrat, MD 04/14/14 2011  Carmin Muskrat, MD 04/14/14 2013

## 2014-04-14 NOTE — ED Notes (Signed)
Patient given a Sprite per RN approval.

## 2014-04-14 NOTE — ED Notes (Signed)
Pt alert & oriented x4, stable gait. Patient given discharge instructions, paperwork & prescription(s). Patient  instructed to stop at the registration desk to finish any additional paperwork. Patient verbalized understanding. Pt left department w/ no further questions. 

## 2014-04-14 NOTE — ED Notes (Signed)
Pt ambulated to restroom & returned to room w/ no complications. 

## 2014-04-14 NOTE — ED Notes (Signed)
Pt states epigastric pain began at 2000 last night and radiated through to back. States she took ntg x 1 and 4 baby asa. States relief from medication for a little while last night. Denies any other symptoms at this time.

## 2014-04-15 ENCOUNTER — Telehealth (INDEPENDENT_AMBULATORY_CARE_PROVIDER_SITE_OTHER): Payer: Self-pay | Admitting: *Deleted

## 2014-04-15 ENCOUNTER — Ambulatory Visit (INDEPENDENT_AMBULATORY_CARE_PROVIDER_SITE_OTHER): Payer: Medicare Other | Admitting: Internal Medicine

## 2014-04-15 ENCOUNTER — Other Ambulatory Visit (INDEPENDENT_AMBULATORY_CARE_PROVIDER_SITE_OTHER): Payer: Self-pay | Admitting: *Deleted

## 2014-04-15 ENCOUNTER — Encounter (INDEPENDENT_AMBULATORY_CARE_PROVIDER_SITE_OTHER): Payer: Self-pay | Admitting: Internal Medicine

## 2014-04-15 ENCOUNTER — Encounter (INDEPENDENT_AMBULATORY_CARE_PROVIDER_SITE_OTHER): Payer: Self-pay | Admitting: *Deleted

## 2014-04-15 VITALS — BP 110/70 | HR 68 | Temp 98.6°F | Resp 18 | Ht 63.0 in | Wt 134.2 lb

## 2014-04-15 DIAGNOSIS — R1310 Dysphagia, unspecified: Secondary | ICD-10-CM

## 2014-04-15 DIAGNOSIS — R1319 Other dysphagia: Secondary | ICD-10-CM

## 2014-04-15 DIAGNOSIS — R1314 Dysphagia, pharyngoesophageal phase: Secondary | ICD-10-CM

## 2014-04-15 DIAGNOSIS — I2510 Atherosclerotic heart disease of native coronary artery without angina pectoris: Secondary | ICD-10-CM

## 2014-04-15 DIAGNOSIS — K219 Gastro-esophageal reflux disease without esophagitis: Secondary | ICD-10-CM

## 2014-04-15 DIAGNOSIS — K589 Irritable bowel syndrome without diarrhea: Secondary | ICD-10-CM

## 2014-04-15 DIAGNOSIS — R109 Unspecified abdominal pain: Secondary | ICD-10-CM

## 2014-04-15 MED ORDER — FAMOTIDINE 20 MG PO TABS: 20.0000 mg | ORAL_TABLET | Freq: Two times a day (BID) | ORAL | Status: AC

## 2014-04-15 MED ORDER — DIPHENOXYLATE-ATROPINE 2.5-0.025 MG PO TABS
ORAL_TABLET | ORAL | Status: DC
Start: 2014-04-15 — End: 2014-07-21

## 2014-04-15 MED ORDER — MAGNESIUM OXIDE 400 MG PO TABS: 200.0000 mg | ORAL_TABLET | Freq: Every day | ORAL | Status: DC

## 2014-04-15 NOTE — Telephone Encounter (Signed)
Patient is schedule for EGD/ED 05/06/14, needs to stop Plavix 5 days prior, is this ok with Dr Bronson Ing, thanks

## 2014-04-15 NOTE — Progress Notes (Signed)
Presenting complaint;  Frequent heartburn, dysphagia and diarrhea.  Subjective:  Patient is 63 year old Caucasian female who has history of IBS difficult to manage was last seen in September 2015. Now she returns with frequent heartburn occurring at least 2-3 times a week. She also complains of dysphagia with pills meat and bread.Marland Kitchen She was seen in emergency room yesterday for chest pain at [facility]. EKG and troponin levels were unremarkable. Chest pain responded to GI cocktail. She was also noted to have elevated blood pressure and was advised to go back on valsartan which she takes on as needed basis. She states she develops low blood pressure if she takes this medication daily. She has not tried low dose. She has anywhere from 1-4 bowel movements per day. Stool can sensitivities from loose to semi-formed to formed and hard stool. She has occasional hematochezia in the form of blood in the tissue. She developed LLQ abdominal pain and was treated with Levaquin for 10 days by Dr. Woody Seller. She will take last dose today. She continues to complain of excruciating cramps across upper abdomen. Valium seems to help. On her last visit she was given samples of IBgard which helped with IBS symptoms but not with abdominal cramps. And she was in emergency room yesterday she had ultrasound and there was no change in the size of aortic aneurysm which measured 3.3 cm. He was given prescription for famotidine by ER physician but she has not started this medication. She remains with chronic neck pain. She takes Benadryl for pruritus.    Current Medications: Outpatient Encounter Prescriptions as of 04/15/2014  Medication Sig  . aspirin 325 MG EC tablet Take 325 mg by mouth as needed. Ecotrin  . aspirin EC 81 MG tablet Take 1 tablet (81 mg total) by mouth daily. (Patient taking differently: Take 81 mg by mouth every evening. )  . B Complex-C (SUPER B COMPLEX/VITAMIN C PO) Take by mouth daily.  . calcium carbonate (TUMS EX)  750 MG chewable tablet Chew 1 tablet by mouth as needed for heartburn.  . carisoprodol (SOMA) 350 MG tablet Take 350 mg by mouth at bedtime as needed for muscle spasms.   . cetirizine (ZYRTEC) 10 MG chewable tablet Chew 10 mg by mouth daily.  . cholecalciferol (VITAMIN D) 1000 UNITS tablet Take 1,000 Units by mouth daily.  . clopidogrel (PLAVIX) 75 MG tablet Take 1 tablet (75 mg total) by mouth daily. (Patient taking differently: Take 75 mg by mouth at bedtime. )  . diazepam (VALIUM) 5 MG tablet Take 5-10 mg by mouth 2 (two) times daily. And 2 tablets at night  . dicyclomine (BENTYL) 20 MG tablet Take 1 tablet (20 mg total) by mouth 4 (four) times daily - after meals and at bedtime.  . diphenhydrAMINE (BENADRYL) 25 MG tablet Take 25 mg by mouth daily.  . diphenoxylate-atropine (LOMOTIL) 2.5-0.025 MG per tablet TAKE ONE TABLET BY MOUTH 4 TIMES DAILY AS NEEDED FOR DIARRHEA OR LOOSE STOOLS (Patient taking differently: Take 1 tablet by mouth 4 (four) times daily as needed for diarrhea or loose stools. TAKE ONE TABLET BY MOUTH 4 TIMES DAILY AS NEEDED FOR DIARRHEA OR LOOSE STOOLS)  . famotidine (PEPCID) 20 MG tablet Take 1 tablet (20 mg total) by mouth 2 (two) times daily.  . furosemide (LASIX) 40 MG tablet Take 1 tablet (40 mg total) by mouth daily as needed (swelling).  . gabapentin (NEURONTIN) 600 MG tablet Take 600 mg by mouth 2 (two) times daily.   . hydrOXYzine (VISTARIL)  25 MG capsule Take 25 mg by mouth 2 (two) times daily as needed. For itching  . loperamide (IMODIUM) 2 MG capsule Take 2 mg by mouth daily.  . nebivolol (BYSTOLIC) 5 MG tablet Take 1 tablet (5 mg total) by mouth every morning.  . nitroGLYCERIN (NITROSTAT) 0.4 MG SL tablet Place 1 tablet (0.4 mg total) under the tongue every 5 (five) minutes as needed for chest pain.  Marland Kitchen OVER THE COUNTER MEDICATION IBGuard - Patient states that she takes 4 times daily.  Marland Kitchen oxyCODONE-acetaminophen (PERCOCET/ROXICET) 5-325 MG per tablet Take 1 tablet by  mouth every 6 (six) hours as needed. For pain  . pravastatin (PRAVACHOL) 40 MG tablet Take 40 mg by mouth at bedtime.   . Probiotic Product (PROBIOTIC DAILY PO) Take by mouth daily.  . promethazine (PHENERGAN) 25 MG tablet Take 1 tablet (25 mg total) by mouth every 6 (six) hours as needed for nausea.  Marland Kitchen triamcinolone (NASACORT ALLERGY 24HR) 55 MCG/ACT AERO nasal inhaler Place 2 sprays into the nose daily as needed (for allergies).  . valsartan (DIOVAN) 160 MG tablet Take 160 mg by mouth daily as needed. *only take if systolic level exceeds [id number]  . venlafaxine XR (EFFEXOR-XR) 150 MG 24 hr capsule Take 150 mg by mouth at bedtime.  . vitamin E (VITAMIN E) 400 UNIT capsule Take 400 Units by mouth 3 (three) times daily.  . [DISCONTINUED] Famotidine 20 MG CHEW Chew 1 tablet (20 mg total) by mouth daily as needed. (Patient not taking: Reported on 04/15/2014)  . [DISCONTINUED] levofloxacin (LEVAQUIN) 500 MG tablet Take 500 mg by mouth daily. 10 day course starting on 04/05/2014  . [DISCONTINUED] Probiotic Product (PROBIOTIC PO) Take 1 capsule by mouth daily.     Objective: Blood pressure 110/70, pulse 68, temperature 98.6 F (37 C), temperature source Oral, resp. rate 18, height 5\' 3"  (1.6 m), weight 134 lb 3.2 oz (60.873 kg). Patient is alert and in no acute distress. Conjunctiva is pink. Sclera is nonicteric Oropharyngeal mucosa is normal. No neck masses or thyromegaly noted. Cardiac exam with regular rhythm normal S1 and S2. No murmur or gallop noted. Lungs are clear to auscultation. Abdomen is symmetrical. Bowel sounds are normal. On palpation abdomen is soft and nontender without organomegaly or masses.  No LE edema or clubbing noted.  Labs/studies Results:   Recent Labs  04/14/14 1507  WBC 7.7  HGB 14.8  HCT 43.8  PLT 273    BMET   Recent Labs  04/14/14 1507  NA 141  K 4.5  CL 99  CO2 30  GLUCOSE 92  BUN 13  CREATININE 1.05  CALCIUM 10.4    LFT   Recent Labs   04/14/14 1507  PROT 7.5  ALBUMIN 3.7  AST 13  ALT 6  ALKPHOS 105  BILITOT 0.2*     Assessment:  #1.  GERD. She is having heartburn at least 2-3 times a week.  She is on Plavix and therefore will hold off using PPI but she can take H2 B. #2.  Dysphagia with solids and pills. She may have esophageal ring stricture or motility disorder. #3.   Chronic diarrhea secondary to IBS. #4.   Abdominal pain felt to be wall pain cramping. Wonder if it is secondary to pravastatin   Plan:  Magnesium oxide 200 mg by mouth daily for 1 month and call with progress report regarding abdominal cramps.  If this symptom persist she will check with Dr. Bronson Ing if pravastatin dose could be  reduced.  EGD with ED.  She will need to hold Plavix for 5 days.  Discontinue Imodium.  New prescription given for diphenoxylate 1 by mouth 4 times a day when necessary 100 with 2 refills.

## 2014-04-15 NOTE — Patient Instructions (Addendum)
Esophagogastroduodenoscopy with esophageal dilation to be scheduled. Take magnesium oxide 200 mg daily for 1 month and call with progress report. This medication does not help with abdominal wall pain or cramps you can.this medication

## 2014-04-17 ENCOUNTER — Encounter (INDEPENDENT_AMBULATORY_CARE_PROVIDER_SITE_OTHER): Payer: Self-pay | Admitting: *Deleted

## 2014-04-17 NOTE — Telephone Encounter (Signed)
Patient aware.

## 2014-04-17 NOTE — Telephone Encounter (Signed)
That would be fine 

## 2014-04-22 ENCOUNTER — Encounter: Payer: Self-pay | Admitting: Family

## 2014-04-23 ENCOUNTER — Ambulatory Visit: Payer: Medicare Other | Admitting: Family

## 2014-04-23 ENCOUNTER — Other Ambulatory Visit (HOSPITAL_COMMUNITY): Payer: Medicare Other

## 2014-04-24 ENCOUNTER — Telehealth: Payer: Self-pay | Admitting: Family

## 2014-04-24 NOTE — Telephone Encounter (Signed)
-----   Message from [place], NP sent at 04/23/2014  3:46 PM EST ----- Regarding: RE: AAA duplex Pt had AAA Duplex last month, small AAA, no change from CT in 2013. She may follow up in a year for AAA Duplex here and see me.  Thank you, Vinnie Level   ----- Message -----    From: Rufina Falco    Sent: 04/23/2014   3:35 PM      To: Revonda Standard Nickel, NP Subject: AAA duplex                                     Patient missed her f/u AAA appointment today, she told Juliann Pulse when the reschedule attempt was made that she had an abdominal ultrasound at [facility] 04/14/2014.  Will you look those results and let Juliann Pulse know when to reschedule an return exam?  Thanks, Rip Harbour

## 2014-04-25 ENCOUNTER — Ambulatory Visit: Payer: Medicare Other | Admitting: Family

## 2014-04-25 ENCOUNTER — Other Ambulatory Visit (HOSPITAL_COMMUNITY): Payer: Medicare Other

## 2014-05-05 ENCOUNTER — Other Ambulatory Visit (HOSPITAL_COMMUNITY): Payer: Medicare Other

## 2014-05-05 ENCOUNTER — Other Ambulatory Visit (INDEPENDENT_AMBULATORY_CARE_PROVIDER_SITE_OTHER): Payer: Self-pay | Admitting: *Deleted

## 2014-05-05 DIAGNOSIS — R1310 Dysphagia, unspecified: Secondary | ICD-10-CM

## 2014-05-05 NOTE — Patient Instructions (Signed)
[name] [name]  05/05/2014   Your procedure is scheduled on:  05/06/2014  Report to [facility] at  1000  AM.  Call this number if you have problems the morning of surgery: [phone]   Remember:   Do not eat food or drink liquids after midnight.   Take these medicines the morning of surgery with A SIP OF WATER:  Valium, valsartan, effexor, pepcid, neurontin, hydoxizine, bystolic, oxycodone, phenergan   Do not wear jewelry, make-up or nail polish.  Do not wear lotions, powders, or perfumes.   Do not shave 48 hours prior to surgery. Men may shave face and neck.  Do not bring valuables to the hospital.  [facility] is not responsible for any belongings or valuables.               Contacts, dentures or bridgework may not be worn into surgery.  Leave suitcase in the car. After surgery it may be brought to your room.  For patients admitted to the hospital, discharge time is determined by your treatment team.               Patients discharged the day of surgery will not be allowed to drive home.  Name and phone number of your driver: family  Special Instructions: N/A   Please read over the following fact sheets that you were given: Pain Booklet, Coughing and Deep Breathing, Surgical Site Infection Prevention, Anesthesia Post-op Instructions and Care and Recovery After Surgery Esophageal Dilatation The esophagus is the long, narrow tube which carries food and liquid from the mouth to the stomach. Esophageal dilatation is the technique used to stretch a blocked or narrowed portion of the esophagus. This procedure is used when a part of the esophagus has become so narrow that it becomes difficult, painful or even impossible to swallow. This is generally an uncomplicated form of treatment. When this is not successful, chest surgery may be required. This is a much more extensive form of treatment with a longer recovery time. CAUSES  Some of the more common causes of blockage or  strictures of the esophagus are:  Narrowing from longstanding inflammation (soreness and redness) of the lower esophagus. This comes from the constant exposure of the lower esophagus to the acid which bubbles up from the stomach. Over time this causes scarring and narrowing of the lower esophagus.  Hiatal hernia in which a small part of the stomach bulges (herniates) up through the diaphragm. This can cause a gradual narrowing of the end of the esophagus.  Schatzki ring is a narrow ring of benign (non-cancerous) fibrous tissue which constricts the lower esophagus. The reason for this is not known.  Scleroderma is a connective tissue disorder that affects the esophagus and makes swallowing difficult.  Achalasia is an absence of nerves to the lower esophagus and to the esophageal sphincter. This is the circular muscle between the stomach and esophagus that relaxes to allow food into the stomach. After swallowing, it contracts to keep food in the stomach. This absence of nerves may be congenital (present since birth). This can cause irregular spasms of the lower esophageal muscle. This spasm does not open up to allow food and fluid through. The result is a persistent blockage with subsequent slow trickling of the esophageal contents into the stomach.  Strictures may develop from swallowing materials which damage the esophagus. Some examples are strong acids or alkalis such as lye.  Growths such as benign (non-cancerous)  and malignant (cancerous) tumors can block the esophagus.  Hereditary (present since birth) causes. DIAGNOSIS  Your caregiver often suspects this problem by taking a medical history. They will also do a physical exam. They can then prove their suspicions using X-rays and endoscopy. Endoscopy is an exam in which a tube like a small, flexible telescope is used to look at your esophagus.  TREATMENT There are different stretching (dilating) techniques that can be used. Simple bougie  dilatation may be done in the office. This usually takes only a couple minutes. A numbing (anesthetic) spray of the throat is used. Endoscopy, when done, is done in an endoscopy suite under mild sedation. When fluoroscopy is used, the procedure is performed in X-ray. Other techniques require a little longer time. Recovery is usually quick. There is no waiting time to begin eating and drinking to test success of the treatment. Following are some of the methods used. Narrowing of the esophagus is treated by making it bigger. Commonly this is a mechanical problem which can be treated with stretching. This can be done in different ways. Your caregiver will discuss these with you. Some of the means used are:  A series of graduated (increasing thickness) flexible dilators can be used. These are weighted tubes passed through the esophagus into the stomach. The tubes used become progressively larger until the desired stretched size is reached. Graduated dilators are a simple and quick way of opening the esophagus. No visualization is required.  Another method is the use of endoscopy to place a flexible wire across the stricture. The endoscope is removed and the wire left in place. A dilator with a hole through it from end to end is guided down the esophagus and across the stricture. One or more of these dilators are passed over the wire. At the end of the exam, the wire is removed. This type of treatment may be performed in the X-ray department under fluoroscopy. An advantage of this procedure is the examiner is visualizing the end opening in the esophagus.  Stretching of the esophagus may be done using balloons. Deflated balloons are placed through the endoscope and across the stricture. This type of balloon dilatation is often done at the time of endoscopy or fluoroscopy. Flexible endoscopy allows the examiner to directly view the stricture. A balloon is inserted in the deflated form into the area of narrowing. It  is then inflated with air to a certain pressure that is preset for a given circumference. When inflated, it becomes sausage shaped, stretched, and makes the stricture larger.  Achalasia requires a longer, larger balloon-type dilator. This is frequently done under X-ray control. In this situation, the spastic muscle fibers in the lower esophagus are stretched. All of the above procedures make the passage of food and water into the stomach easier. They also make it easier for stomach contents to reflux back into the esophagus. Special medications may be used following the procedure to help prevent further stricturing. Proton-pump inhibitor medications are good at decreasing the amount of acid in the stomach juice. When stomach juice refluxes into the esophagus, the juice is no longer as acidic and is less likely to burn or scar the esophagus. RISKS AND COMPLICATIONS Esophageal dilatation is usually performed effectively and without problems. Some complications that can occur are:  A small amount of bleeding almost always happens where the stretching takes place. If this is too excessive it may require more aggressive treatment.  An uncommon complication is perforation (making a hole) of  the esophagus. The esophagus is thin. It is easy to make a hole in it. If this happens, an operation may be necessary to repair this.  A small, undetected perforation could lead to an infection in the chest. This can be very serious. HOME CARE INSTRUCTIONS   If you received sedation for your procedure, do not drive, make important decisions, or perform any activities requiring your full coordination. Do not drink alcohol, take sedatives, or use any mind altering chemicals unless instructed by your caregiver.  You may use throat lozenges or warm salt water gargles if you have throat discomfort.  You can begin eating and drinking normally on return home unless instructed otherwise. Do not purposely try to force large  chunks of food down to test the benefits of your procedure.  Mild discomfort can be eased with sips of ice water.  Medications for discomfort may or may not be needed. SEEK IMMEDIATE MEDICAL CARE IF:   You begin vomiting up blood.  You develop black, tarry stools.  You develop chills or an unexplained temperature of over 101F (38.3C)  You develop chest or abdominal pain.  You develop shortness of breath, or feel light-headed or faint.  Your swallowing is becoming more painful, difficult, or you are unable to swallow. MAKE SURE YOU:   Understand these instructions.  Will watch your condition.  Will get help right away if you are not doing well or get worse. Document Released: 06/23/2005 Document Revised: 09/16/2013 Document Reviewed: 08/10/2005 [facility] Patient Information 2015 [place], [place]. This information is not intended to replace advice given to you by your health care provider. Make sure you discuss any questions you have with your health care provider. Esophagogastroduodenoscopy Esophagogastroduodenoscopy (EGD) is a procedure to examine the lining of the esophagus, stomach, and first part of the small intestine (duodenum). A long, flexible, lighted tube with a camera attached (endoscope) is inserted down the throat to view these organs. This procedure is done to detect problems or abnormalities, such as inflammation, bleeding, ulcers, or growths, in order to treat them. The procedure lasts about 5-20 minutes. It is usually an outpatient procedure, but it may need to be performed in emergency cases in the hospital. LET YOUR CAREGIVER KNOW ABOUT:   Allergies to food or medicine.  All medicines you are taking, including vitamins, herbs, eyedrops, and over-the-counter medicines and creams.  Use of steroids (by mouth or creams).  Previous problems you or members of your family have had with the use of anesthetics.  Any blood disorders you have.  Previous surgeries you  have had.  Other health problems you have.  Possibility of pregnancy, if this applies. RISKS AND COMPLICATIONS  Generally, EGD is a safe procedure. However, as with any procedure, complications can occur. Possible complications include:  Infection.  Bleeding.  Tearing (perforation) of the esophagus, stomach, or duodenum.  Difficulty breathing or not being able to breath.  Excessive sweating.  Spasms of the larynx.  Slowed heartbeat.  Low blood pressure. BEFORE THE PROCEDURE  Do not eat or drink anything for 6-8 hours before the procedure or as directed by your caregiver.  Ask your caregiver about changing or stopping your regular medicines.  If you wear dentures, be prepared to remove them before the procedure.  Arrange for someone to drive you home after the procedure. PROCEDURE   A vein will be accessed to give medicines and fluids. A medicine to relax you (sedative) and a pain reliever will be given through that access into  the vein.  A numbing medicine (local anesthetic) may be sprayed on your throat for comfort and to stop you from gagging or coughing.  A mouth guard may be placed in your mouth to protect your teeth and to keep you from biting on the endoscope.  You will be asked to lie on your left side.  The endoscope is inserted down your throat and into the esophagus, stomach, and duodenum.  Air is put through the endoscope to allow your caregiver to view the lining of your esophagus clearly.  The esophagus, stomach, and duodenum is then examined. During the exam, your caregiver may:  Remove tissue to be examined under a microscope (biopsy) for inflammation, infection, or other medical problems.  Remove growths.  Remove objects (foreign bodies) that are stuck.  Treat any bleeding with medicines or other devices that stop tissues from bleeding (hot cautery, clipping devices).  Widen (dilate) or stretch narrowed areas of the esophagus and stomach.  The  endoscope will then be withdrawn. AFTER THE PROCEDURE  You will be taken to a recovery area to be monitored. You will be able to go home once you are stable and alert.  Do not eat or drink anything until the local anesthetic and numbing medicines have worn off. You may choke.  It is normal to feel bloated, have pain with swallowing, or have a sore throat for a short time. This will wear off.  Your caregiver should be able to discuss his or her findings with you. It will take longer to discuss the test results if any biopsies were taken. Document Released: 09/02/2004 Document Revised: 09/16/2013 Document Reviewed: 04/04/2012 [facility] Patient Information 2015 [place], [place]. This information is not intended to replace advice given to you by your health care provider. Make sure you discuss any questions you have with your health care provider. PATIENT INSTRUCTIONS POST-ANESTHESIA  IMMEDIATELY FOLLOWING SURGERY:  Do not drive or operate machinery for the first twenty four hours after surgery.  Do not make any important decisions for twenty four hours after surgery or while taking narcotic pain medications or sedatives.  If you develop intractable nausea and vomiting or a severe headache please notify your doctor immediately.  FOLLOW-UP:  Please make an appointment with your surgeon as instructed. You do not need to follow up with anesthesia unless specifically instructed to do so.  WOUND CARE INSTRUCTIONS (if applicable):  Keep a dry clean dressing on the anesthesia/puncture wound site if there is drainage.  Once the wound has quit draining you may leave it open to air.  Generally you should leave the bandage intact for twenty four hours unless there is drainage.  If the epidural site drains for more than 36-48 hours please call the anesthesia department.  QUESTIONS?:  Please feel free to call your physician or the hospital operator if you have any questions, and they will be happy to assist you.

## 2014-05-06 ENCOUNTER — Encounter (HOSPITAL_COMMUNITY)
Admission: RE | Admit: 2014-05-06 | Discharge: 2014-05-06 | Disposition: A | Discharge status: Home / Self Care | Payer: Medicare Other | Source: Ambulatory Visit | Attending: Internal Medicine | Admitting: Internal Medicine

## 2014-05-06 ENCOUNTER — Encounter (HOSPITAL_COMMUNITY): Payer: Self-pay

## 2014-05-07 ENCOUNTER — Ambulatory Visit (HOSPITAL_COMMUNITY)
Admission: RE | Admit: 2014-05-07 | Discharge: 2014-05-07 | Disposition: A | Discharge status: Home / Self Care | Payer: Medicare Other | Source: Ambulatory Visit | Attending: Internal Medicine | Admitting: Internal Medicine

## 2014-05-07 ENCOUNTER — Ambulatory Visit (HOSPITAL_COMMUNITY): Payer: Medicare Other | Admitting: Anesthesiology

## 2014-05-07 ENCOUNTER — Ambulatory Visit (HOSPITAL_COMMUNITY): Admission: RE | Admit: 2014-05-07 | Payer: Medicare Other | Source: Ambulatory Visit | Admitting: Internal Medicine

## 2014-05-07 ENCOUNTER — Encounter (HOSPITAL_COMMUNITY): Admission: RE | Payer: Self-pay | Source: Ambulatory Visit

## 2014-05-07 ENCOUNTER — Encounter (HOSPITAL_COMMUNITY)
Admission: RE | Disposition: A | Discharge status: Home / Self Care | Payer: Self-pay | Source: Ambulatory Visit | Attending: Internal Medicine

## 2014-05-07 ENCOUNTER — Encounter (HOSPITAL_COMMUNITY): Payer: Self-pay

## 2014-05-07 DIAGNOSIS — I2510 Atherosclerotic heart disease of native coronary artery without angina pectoris: Secondary | ICD-10-CM | POA: Insufficient documentation

## 2014-05-07 DIAGNOSIS — F17210 Nicotine dependence, cigarettes, uncomplicated: Secondary | ICD-10-CM | POA: Insufficient documentation

## 2014-05-07 DIAGNOSIS — Z881 Allergy status to other antibiotic agents status: Secondary | ICD-10-CM | POA: Diagnosis not present

## 2014-05-07 DIAGNOSIS — I10 Essential (primary) hypertension: Secondary | ICD-10-CM | POA: Insufficient documentation

## 2014-05-07 DIAGNOSIS — K5730 Diverticulosis of large intestine without perforation or abscess without bleeding: Secondary | ICD-10-CM | POA: Diagnosis not present

## 2014-05-07 DIAGNOSIS — Z7982 Long term (current) use of aspirin: Secondary | ICD-10-CM | POA: Diagnosis not present

## 2014-05-07 DIAGNOSIS — R0789 Other chest pain: Secondary | ICD-10-CM

## 2014-05-07 DIAGNOSIS — Z880 Allergy status to penicillin: Secondary | ICD-10-CM | POA: Insufficient documentation

## 2014-05-07 DIAGNOSIS — I714 Abdominal aortic aneurysm, without rupture: Secondary | ICD-10-CM | POA: Diagnosis not present

## 2014-05-07 DIAGNOSIS — F329 Major depressive disorder, single episode, unspecified: Secondary | ICD-10-CM | POA: Insufficient documentation

## 2014-05-07 DIAGNOSIS — E785 Hyperlipidemia, unspecified: Secondary | ICD-10-CM | POA: Insufficient documentation

## 2014-05-07 DIAGNOSIS — Z91013 Allergy to seafood: Secondary | ICD-10-CM | POA: Diagnosis not present

## 2014-05-07 DIAGNOSIS — K449 Diaphragmatic hernia without obstruction or gangrene: Secondary | ICD-10-CM | POA: Diagnosis not present

## 2014-05-07 DIAGNOSIS — Z885 Allergy status to narcotic agent status: Secondary | ICD-10-CM | POA: Insufficient documentation

## 2014-05-07 DIAGNOSIS — F419 Anxiety disorder, unspecified: Secondary | ICD-10-CM | POA: Diagnosis not present

## 2014-05-07 DIAGNOSIS — K259 Gastric ulcer, unspecified as acute or chronic, without hemorrhage or perforation: Secondary | ICD-10-CM | POA: Insufficient documentation

## 2014-05-07 DIAGNOSIS — K210 Gastro-esophageal reflux disease with esophagitis: Secondary | ICD-10-CM | POA: Diagnosis not present

## 2014-05-07 DIAGNOSIS — K2960 Other gastritis without bleeding: Secondary | ICD-10-CM

## 2014-05-07 DIAGNOSIS — K219 Gastro-esophageal reflux disease without esophagitis: Secondary | ICD-10-CM

## 2014-05-07 DIAGNOSIS — Z79899 Other long term (current) drug therapy: Secondary | ICD-10-CM | POA: Insufficient documentation

## 2014-05-07 DIAGNOSIS — R1310 Dysphagia, unspecified: Secondary | ICD-10-CM | POA: Diagnosis not present

## 2014-05-07 DIAGNOSIS — K209 Esophagitis, unspecified: Secondary | ICD-10-CM

## 2014-05-07 DIAGNOSIS — M1990 Unspecified osteoarthritis, unspecified site: Secondary | ICD-10-CM | POA: Insufficient documentation

## 2014-05-07 DIAGNOSIS — K589 Irritable bowel syndrome without diarrhea: Secondary | ICD-10-CM | POA: Insufficient documentation

## 2014-05-07 HISTORY — PX: BIOPSY: SHX5522

## 2014-05-07 HISTORY — PX: ESOPHAGOGASTRODUODENOSCOPY (EGD) WITH PROPOFOL: SHX5813

## 2014-05-07 HISTORY — PX: MALONEY DILATION: SHX5535

## 2014-05-07 SURGERY — ESOPHAGOGASTRODUODENOSCOPY (EGD) WITH PROPOFOL
Anesthesia: Monitor Anesthesia Care

## 2014-05-07 MED ORDER — FENTANYL CITRATE 0.05 MG/ML IJ SOLN: 25.0000 ug | INTRAMUSCULAR | Status: DC | PRN

## 2014-05-07 MED ORDER — PANTOPRAZOLE SODIUM 40 MG PO TBEC: 40.0000 mg | DELAYED_RELEASE_TABLET | Freq: Every day | ORAL | Status: DC

## 2014-05-07 MED ORDER — STERILE WATER FOR IRRIGATION IR SOLN
Status: DC | PRN
  Administered 2014-05-07: 1000 mL

## 2014-05-07 MED ORDER — LACTATED RINGERS IV SOLN
INTRAVENOUS | Status: DC
  Administered 2014-05-07: 11:00:00 via INTRAVENOUS

## 2014-05-07 MED ORDER — PROPOFOL 10 MG/ML IV BOLUS
INTRAVENOUS | Status: DC | PRN
  Administered 2014-05-07: 10 mg via INTRAVENOUS

## 2014-05-07 MED ORDER — PROPOFOL 10 MG/ML IV BOLUS
INTRAVENOUS | Status: AC
  Filled 2014-05-07: qty 20

## 2014-05-07 MED ORDER — ONDANSETRON HCL 4 MG/2ML IJ SOLN: 4.0000 mg | Freq: Once | INTRAMUSCULAR | Status: DC | PRN

## 2014-05-07 MED ORDER — BUTAMBEN-TETRACAINE-BENZOCAINE 2-2-14 % EX AERO
1.0000 | INHALATION_SPRAY | Freq: Once | CUTANEOUS | Status: AC
  Administered 2014-05-07: 1 via TOPICAL
  Filled 2014-05-07: qty 56

## 2014-05-07 MED ORDER — BUTAMBEN-TETRACAINE-BENZOCAINE 2-2-14 % EX AERO
1.0000 | INHALATION_SPRAY | Freq: Once | CUTANEOUS | Status: DC
  Filled 2014-05-07: qty 56

## 2014-05-07 MED ORDER — ONDANSETRON HCL 4 MG/2ML IJ SOLN
4.0000 mg | Freq: Once | INTRAMUSCULAR | Status: AC
  Administered 2014-05-07: 4 mg via INTRAVENOUS
  Filled 2014-05-07: qty 2

## 2014-05-07 MED ORDER — LIDOCAINE HCL (PF) 1 % IJ SOLN
INTRAMUSCULAR | Status: AC
  Filled 2014-05-07: qty 10

## 2014-05-07 MED ORDER — MIDAZOLAM HCL 2 MG/2ML IJ SOLN
1.0000 mg | INTRAMUSCULAR | Status: DC | PRN
  Administered 2014-05-07: 2 mg via INTRAVENOUS
  Filled 2014-05-07: qty 2

## 2014-05-07 MED ORDER — PROPOFOL INFUSION 10 MG/ML OPTIME
INTRAVENOUS | Status: DC | PRN
  Administered 2014-05-07: 100 ug/kg/min via INTRAVENOUS

## 2014-05-07 MED ORDER — FENTANYL CITRATE 0.05 MG/ML IJ SOLN
25.0000 ug | INTRAMUSCULAR | Status: AC
  Administered 2014-05-07 (×2): 25 ug via INTRAVENOUS
  Filled 2014-05-07: qty 2

## 2014-05-07 MED ORDER — LIDOCAINE HCL (CARDIAC) 10 MG/ML IV SOLN
INTRAVENOUS | Status: DC | PRN
  Administered 2014-05-07: 30 mg via INTRAVENOUS

## 2014-05-07 SURGICAL SUPPLY — 23 items
BLOCK BITE 60FR ADLT L/F BLUE (MISCELLANEOUS) ×3 IMPLANT
ELECT REM PT RETURN 9FT ADLT (ELECTROSURGICAL)
ELECTRODE REM PT RTRN 9FT ADLT (ELECTROSURGICAL) IMPLANT
FLOOR PAD 36X40 (MISCELLANEOUS) ×3
FORCEP COLD BIOPSY (CUTTING FORCEPS) IMPLANT
FORCEP RJ3 GP 1.8X160 W-NEEDLE (CUTTING FORCEPS) IMPLANT
FORCEPS BIOP RAD 4 LRG CAP 4 (CUTTING FORCEPS) ×3 IMPLANT
FORMALIN 10 PREFIL 20ML (MISCELLANEOUS) ×3 IMPLANT
KIT CLEAN ENDO COMPLIANCE (KITS) ×3 IMPLANT
MANIFOLD NEPTUNE II (INSTRUMENTS) ×3 IMPLANT
MANIFOLD NEPTUNE WASTE (CANNULA) ×3 IMPLANT
NEEDLE SCLEROTHERAPY 25GX240 (NEEDLE) IMPLANT
PAD FLOOR 36X40 (MISCELLANEOUS) ×1 IMPLANT
PROBE APC STR FIRE (PROBE) IMPLANT
PROBE INJECTION GOLD (MISCELLANEOUS)
PROBE INJECTION GOLD 7FR (MISCELLANEOUS) IMPLANT
SNARE ROTATE MED OVAL 20MM (MISCELLANEOUS) IMPLANT
SNARE SHORT THROW 13M SML OVAL (MISCELLANEOUS) ×3 IMPLANT
SYR 50ML LL SCALE MARK (SYRINGE) ×3 IMPLANT
SYR INFLATION 60ML (SYRINGE) IMPLANT
TUBING ENDO SMARTCAP PENTAX (MISCELLANEOUS) ×3 IMPLANT
TUBING IRRIGATION ENDOGATOR (MISCELLANEOUS) ×3 IMPLANT
WATER STERILE IRR 1000ML POUR (IV SOLUTION) ×3 IMPLANT

## 2014-05-07 NOTE — H&P (Signed)
[name] [name] is an 63 y.o. female.   Chief Complaint: Patient is here for EGD and ED. HPI: Patient is 63 year old Caucasian female with multiple medical problems was seen in the office about 3 weeks ago complaining of dysphagia with pills and solids. She's also been having heartburn at least 2-3 times a week. Prior to that visit she was seen in emergency room for chest pain and cardiac workup was negative. She was given GI cocktail and chest pain resolved. She denies nausea vomiting melena or frank rectal bleeding. She has occasional hematochezia in the form of blood in the tissue. Patient has not lost any weight.  Past Medical History  Diagnosis Date  . GERD (gastroesophageal reflux disease)   . Abdominal cramping 09/21/2010  . Chronic diarrhea   . Coronary artery disease     Status post drug eluding stent to the right coronary artery status post in-stent restenosis.  DAPT  . AAA (abdominal aortic aneurysm)   . Cancer 2010    right breast   mastectomy and cheotherapy  . IBS (irritable bowel syndrome)   . Diverticulosis   . Aortic aneurysm, abdominal   . Hypertension   . Arthritis   . Anxiety   . Depression   . Hyperlipidemia     Past Surgical History  Procedure Laterality Date  . Mastectomy  05/27/2008    RIGHT  . Small bowel givens  01/06/2009  . Ct angiography  11/02/2010    ABD &PELV  . Appendectomy  1995  . Colonoscopy    . Cervical fusion      x6  . Cataract extraction w/phaco Left 07/19/2012    Procedure: CATARACT EXTRACTION PHACO AND INTRAOCULAR LENS PLACEMENT (IOC);  Surgeon: Tonny Branch, MD;  Location: [facility];  Service: Ophthalmology;  Laterality: Left;  CDE:9.95  . Coronary angioplasty with stent placement  2000-2010    3 cardiac stent splaced per patient    Family History  Problem Relation Age of Onset  . Diverticulitis Mother   . Heart disease Father   . Aortic aneurysm Father   . Heart disease Brother   . Aortic aneurysm Brother   . Crohn's disease Brother     Social History:  reports that she has been smoking Cigarettes.  She started smoking about 48 years ago. She has a 45 pack-year smoking history. She has never used smokeless tobacco. She reports that she does not drink alcohol or use illicit drugs.  Allergies:  Allergies  Allergen Reactions  . Amlodipine Besylate Hives  . Doxazosin Mesylate Hives  . Erythromycin Hives  . Lisinopril Hives    REACTION: causes hives  . Metoprolol Tartrate Hives    REACTION: causes hives  . Penicillins Hives  . Prednisone     Patient states that it caused bad abdominal pain  . Oxycodone Hives and Itching    Benadryl is needed with dose    Medications Prior to Admission  Medication Sig Dispense Refill  . aspirin 325 MG EC tablet Take 325 mg by mouth as needed. Ecotrin    . calcium carbonate (TUMS EX) 750 MG chewable tablet Chew 1 tablet by mouth as needed for heartburn.    . cholecalciferol (VITAMIN D) 1000 UNITS tablet Take 1,000 Units by mouth daily.    . magnesium oxide (MAG-OX) 400 MG tablet Take 0.5 tablets (200 mg total) by mouth daily.    . promethazine (PHENERGAN) 25 MG tablet Take 1 tablet (25 mg total) by mouth every 6 (six) hours as  needed for nausea. 30 tablet 0  . aspirin EC 81 MG tablet Take 1 tablet (81 mg total) by mouth daily. (Patient taking differently: Take 81 mg by mouth every evening. )    . B Complex-C (SUPER B COMPLEX/VITAMIN C PO) Take by mouth daily.    . carisoprodol (SOMA) 350 MG tablet Take 350 mg by mouth at bedtime as needed for muscle spasms.     . cetirizine (ZYRTEC) 10 MG chewable tablet Chew 10 mg by mouth daily.    . clopidogrel (PLAVIX) 75 MG tablet Take 1 tablet (75 mg total) by mouth daily. (Patient taking differently: Take 75 mg by mouth at bedtime. ) 30 tablet 6  . diazepam (VALIUM) 5 MG tablet Take 5-10 mg by mouth 2 (two) times daily. And 2 tablets at night    . dicyclomine (BENTYL) 20 MG tablet Take 1 tablet (20 mg total) by mouth 4 (four) times daily - after  meals and at bedtime. 120 tablet 4  . diphenhydrAMINE (BENADRYL) 25 MG tablet Take 25 mg by mouth daily.    . diphenoxylate-atropine (LOMOTIL) 2.5-0.025 MG per tablet TAKE ONE TABLET BY MOUTH 4 TIMES DAILY AS NEEDED FOR DIARRHEA OR LOOSE STOOLS 100 tablet 2  . famotidine (PEPCID) 20 MG tablet Take 1 tablet (20 mg total) by mouth 2 (two) times daily. 60 tablet 5  . furosemide (LASIX) 40 MG tablet Take 1 tablet (40 mg total) by mouth daily as needed (swelling). 30 tablet 6  . gabapentin (NEURONTIN) 600 MG tablet Take 600 mg by mouth 2 (two) times daily.     . hydrOXYzine (VISTARIL) 25 MG capsule Take 25 mg by mouth 2 (two) times daily as needed. For itching    . nebivolol (BYSTOLIC) 5 MG tablet Take 1 tablet (5 mg total) by mouth every morning. 30 tablet 6  . nitroGLYCERIN (NITROSTAT) 0.4 MG SL tablet Place 1 tablet (0.4 mg total) under the tongue every 5 (five) minutes as needed for chest pain. 25 tablet 3  . OVER THE COUNTER MEDICATION IBGuard - Patient states that she takes 4 times daily.    Marland Kitchen oxyCODONE-acetaminophen (PERCOCET/ROXICET) 5-325 MG per tablet Take 1 tablet by mouth every 6 (six) hours as needed. For pain    . pravastatin (PRAVACHOL) 40 MG tablet Take 40 mg by mouth at bedtime.     . Probiotic Product (PROBIOTIC DAILY PO) Take by mouth daily.    Marland Kitchen triamcinolone (NASACORT ALLERGY 24HR) 55 MCG/ACT AERO nasal inhaler Place 2 sprays into the nose daily as needed (for allergies).    . valsartan (DIOVAN) 160 MG tablet Take 160 mg by mouth daily as needed. *only take if systolic level exceeds [id number]    . venlafaxine XR (EFFEXOR-XR) 150 MG 24 hr capsule Take 150 mg by mouth at bedtime.    . vitamin E (VITAMIN E) 400 UNIT capsule Take 400 Units by mouth 3 (three) times daily.      No results found for this or any previous visit (from the past 48 hour(s)). No results found.  ROS  Blood pressure 121/98, pulse 57, temperature 98.4 F (36.9 C), temperature source Oral, resp. rate 14, height 5'  3" (1.6 m), weight 127 lb (57.607 kg), SpO2 100 %. Physical Exam  Constitutional: She appears well-developed and well-nourished.  Eyes: Conjunctivae are normal. No scleral icterus.  Neck: No thyromegaly present.  Cardiovascular: Normal rate, regular rhythm and normal heart sounds.   No murmur heard. Respiratory: Effort normal and breath sounds normal.  GI: Soft. She exhibits no distension and no mass. There is no tenderness.  Musculoskeletal: She exhibits no edema.  Lymphadenopathy:    She has no cervical adenopathy.  Neurological: She is alert.  Skin: Skin is warm and dry.     Assessment/Plan Solid food dysphagia in a patient with chronic GERD. Noncardiac chest pain possibly due to GERD. EGD with ED under monitored anesthesia care.  REHMAN,NAJEEB U 05/07/2014, 11:02 AM

## 2014-05-07 NOTE — Op Note (Signed)
EGD PROCEDURE REPORT  PATIENT:  [name] [name]  MR#:  [id number] Birthdate:  26-Feb-1951, 63 y.o., female Endoscopist:  Dr. Rogene Houston, MD Referred By:  Dr. Glenda Chroman, MD Procedure Date: 05/07/2014  Procedure:   EGD with ED   Indications:  Patient is 63 year old Caucasian female who has chronic GERD and now presents with dysphagia with solids and pills. She was recently evaluated in emergency room for chest pain which was felt to be of GI origin. She states she has heartburn at least 2-3 times a week. Patient had been on pantoprazole previously which was discontinued for unclear reasons. She is presently on famotidine.            Informed Consent:  The risks, benefits, alternatives & imponderables which include, but are not limited to, bleeding, infection, perforation, drug reaction and potential missed lesion have been reviewed.  The potential for biopsy, lesion removal, esophageal dilation, etc. have also been discussed.  Questions have been answered.  All parties agreeable.  Please see history & physical in medical record for more information.  Medications:  Monitored anesthesia care. Cetacaine spray topically for oropharyngeal anesthesia  Description of procedure:  The endoscope was introduced through the mouth and advanced to the second portion of the duodenum without difficulty or limitations. The mucosal surfaces were surveyed very carefully during advancement of the scope and upon withdrawal.  Findings:  Esophagus:  Mucosa of the esophagus was normal. GE junction was wavy or serrated with erythema no ring or stricture noted. GEJ:  37 cm Hiatus:  39 cm Stomach:  Small amount of food debris noted in proximal stomach. Stomach distended very well with insufflation. Folds in the proximal stomach were unremarkable. Multiple antral erosions noted along with 2 x 4 mm ulcer to its lesser curvature. Pyloric channel was patent. Angularis fundus and cardia with examined by retroflexion of  scope and were normal. Duodenum:  Normal bulbar and post bulbar mucosa.  Therapeutic/Diagnostic Maneuvers Performed:   Esophagus was dilated by passing 56 [place] Maloney dilator to full insertion. Endoscope was passed again and no mucosal disruption noted to esophageal mucosa. Antral biopsy was taken for routine histology primarily looking for H. pylori.  Complications:  None  Impression: Small sliding hiatal hernia with mild changes of reflux esophagitis limited to GE junction but no evidence of ring or stricture formation. Small ulcer at antrum along with multiple erosions. Antral biopsy taken for routine histology. Small amount of food debris in the stomach with patent pylorus which may indicate gastroparesis. Esophagus dilated by passing 56 French Maloney dilator but no mucosal disruption and use.  Recommendations:  Patient will resume low-dose aspirin and Plavix as before. Patient advised not to Aleve or Advil. Pantoprazole 40 mg by mouth every morning. While on pantoprazole she should take Plavix in the evening. Continue famotidine 20 mg but take it in the evening or at bedtime. I will be contacting patient with biopsy results.  Deylan Canterbury U  05/07/2014  11:51 AM  CC: Dr. Glenda Chroman., MD & Dr. Rayne Du ref. provider found

## 2014-05-07 NOTE — Transfer of Care (Signed)
Immediate Anesthesia Transfer of Care Note  Patient: [name] [name]  Procedure(s) Performed: Procedure(s): ESOPHAGOGASTRODUODENOSCOPY (EGD) WITH PROPOFOL (N/A) MALONEY DILATION 56 cm (N/A) BIOPSY (N/A)  Patient Location: PACU  Anesthesia Type:MAC  Level of Consciousness: awake, alert  and oriented  Airway & Oxygen Therapy: Patient Spontanous Breathing and Patient connected to face mask oxygen  Post-op Assessment: Report given to PACU RN  Post vital signs: Reviewed and stable  Complications: No apparent anesthesia complications

## 2014-05-07 NOTE — Anesthesia Postprocedure Evaluation (Signed)
  Anesthesia Post-op Note  Patient: [name] [name]  Procedure(s) Performed: Procedure(s): ESOPHAGOGASTRODUODENOSCOPY (EGD) WITH PROPOFOL (N/A) MALONEY DILATION 56 cm (N/A) BIOPSY (N/A)  Patient Location: PACU  Anesthesia Type:MAC  Level of Consciousness: awake  Airway and Oxygen Therapy: Patient Spontanous Breathing and Patient connected to face mask oxygen  Post-op Pain: none  Post-op Assessment: Post-op Vital signs reviewed, Patient's Cardiovascular Status Stable, Respiratory Function Stable, Patent Airway and No signs of Nausea or vomiting  Post-op Vital Signs: Reviewed and stable  Last Vitals:  Filed Vitals:   05/07/14 1100  BP: 121/98  Pulse: 57  Temp:   Resp: 33    Complications: No apparent anesthesia complications

## 2014-05-07 NOTE — Anesthesia Preprocedure Evaluation (Signed)
Anesthesia Evaluation  Patient identified by MRN, date of birth, ID band Patient awake    Reviewed: Allergy & Precautions, H&P , NPO status , Patient's Chart, lab work & pertinent test results  Airway Mallampati: II  TM Distance: >3 FB     Dental  (+) Teeth Intact   Pulmonary COPDCurrent Smoker,  breath sounds clear to auscultation        Cardiovascular hypertension, Pt. on medications + CAD, + Cardiac Stents and + Peripheral Vascular Disease (AAA) Rhythm:Regular Rate:Normal     Neuro/Psych PSYCHIATRIC DISORDERS Anxiety Depression    GI/Hepatic GERD-  Medicated,  Endo/Other    Renal/GU      Musculoskeletal   Abdominal   Peds  (+) Delivery details - Hematology   Anesthesia Other Findings   Reproductive/Obstetrics                             Anesthesia Physical Anesthesia Plan  ASA: III  Anesthesia Plan: MAC   Post-op Pain Management:    Induction: Intravenous  Airway Management Planned: Simple Face Mask  Additional Equipment:   Intra-op Plan:   Post-operative Plan:   Informed Consent: I have reviewed the patients History and Physical, chart, labs and discussed the procedure including the risks, benefits and alternatives for the proposed anesthesia with the patient or authorized representative who has indicated his/her understanding and acceptance.     Plan Discussed with:   Anesthesia Plan Comments:         Anesthesia Quick Evaluation

## 2014-05-07 NOTE — Discharge Instructions (Signed)
Resume usual medications including Plavix or clopidogrel which can be started this evening.  Resume other medications as before. Pantoprazole 40 mg by mouth 30 minutes before breakfast daily and take famotidine in the evening or at bedtime. Do not take OTC NSAIDs such as Advil or Aleve. No driving for 24 hours. Physician will call with biopsy results.  Esophagogastroduodenoscopy Care After Refer to this sheet in the next few weeks. These instructions provide you with information on caring for yourself after your procedure. Your caregiver may also give you more specific instructions. Your treatment has been planned according to current medical practices, but problems sometimes occur. Call your caregiver if you have any problems or questions after your procedure.  HOME CARE INSTRUCTIONS  Do not eat or drink anything until the numbing medicine (local anesthetic) has worn off and your gag reflex has returned. You will know that the local anesthetic has worn off when you can swallow comfortably.  Do not drive for 12 hours after the procedure or as directed by your caregiver.  Only take medicines as directed by your caregiver. SEEK MEDICAL CARE IF:   You cannot stop coughing.  You are not urinating at all or less than usual. SEEK IMMEDIATE MEDICAL CARE IF:  You have difficulty swallowing.  You cannot eat or drink.  You have worsening throat or chest pain.  You have dizziness, lightheadedness, or you faint.  You have nausea or vomiting.  You have chills.  You have a fever.  You have severe abdominal pain.  You have black, tarry, or bloody stools. Document Released: 04/18/2012 Document Reviewed: 04/18/2012 [facility] Patient Information 2015 [place]. This information is not intended to replace advice given to you by your health care provider. Make sure you discuss any questions you have with your health care provider.

## 2014-05-12 ENCOUNTER — Encounter (HOSPITAL_COMMUNITY): Payer: Self-pay | Admitting: Internal Medicine

## 2014-05-13 ENCOUNTER — Encounter (INDEPENDENT_AMBULATORY_CARE_PROVIDER_SITE_OTHER): Payer: Self-pay | Admitting: *Deleted

## 2014-05-13 ENCOUNTER — Encounter: Payer: Medicare Other | Admitting: Cardiovascular Disease

## 2014-06-04 ENCOUNTER — Encounter: Payer: Medicare Other | Admitting: Cardiovascular Disease

## 2014-06-11 ENCOUNTER — Telehealth (INDEPENDENT_AMBULATORY_CARE_PROVIDER_SITE_OTHER): Payer: Self-pay | Admitting: *Deleted

## 2014-06-11 NOTE — Telephone Encounter (Signed)
This is correct , per Deberah Castle instructed that the patient go to the ED for further evaluation. Charna Busman instructed the patient of  Cori Razor recommendation.

## 2014-06-11 NOTE — Telephone Encounter (Signed)
[name] [name] called stating she is having abd cramping with rectal bleeding. Spoke with Tammy and per Deberah Castle, NP, patient will need to go to the ED.

## 2014-06-23 ENCOUNTER — Telehealth: Payer: Self-pay | Admitting: *Deleted

## 2014-06-23 MED ORDER — NEBIVOLOL HCL 5 MG PO TABS: 5.0000 mg | ORAL_TABLET | ORAL | Status: DC

## 2014-06-23 NOTE — Telephone Encounter (Signed)
Received refill request from [facility] 5 mg. Medication sent to pharmacy.

## 2014-06-26 ENCOUNTER — Encounter: Payer: Medicare Other | Admitting: Cardiovascular Disease

## 2014-06-26 ENCOUNTER — Encounter: Payer: Self-pay | Admitting: Cardiovascular Disease

## 2014-07-07 ENCOUNTER — Telehealth: Payer: Self-pay | Admitting: *Deleted

## 2014-07-07 MED ORDER — CARVEDILOL 3.125 MG PO TABS
3.1250 mg | ORAL_TABLET | Freq: Two times a day (BID) | ORAL | Status: DC
Start: 2014-07-07 — End: 2014-08-11

## 2014-07-07 NOTE — Telephone Encounter (Signed)
Substitute carvedilol 3.125 mg bid.

## 2014-07-07 NOTE — Telephone Encounter (Signed)
Medication sent to pharmacy  

## 2014-07-07 NOTE — Addendum Note (Signed)
Addended by: Julian Hy T on: 07/07/2014 10:16 AM   Modules accepted: Orders, Medications

## 2014-07-07 NOTE — Telephone Encounter (Signed)
Pt was denied bystolic 5 mg. Pharmacy requesting another alternative. Will forward to provider

## 2014-07-14 ENCOUNTER — Telehealth: Payer: Self-pay | Admitting: *Deleted

## 2014-07-14 NOTE — Telephone Encounter (Signed)
Please refer to my phone note on 07/07/14.

## 2014-07-14 NOTE — Telephone Encounter (Signed)
Massie Maroon, CMA at 07/07/2014 10:16 AM    Status: Signed      Expand All Collapse All    Medication sent to pharmacy. ====================================================  Herminio Commons, MD at 07/07/2014 8:33 AM    Status: Signed      Expand All Collapse All    Substitute carvedilol 3.125 mg bid. ========================================================  Staci T Ashworth, CMA at 07/07/2014 8:13 AM    Status: Signed      Expand All Collapse All    Pt was denied bystolic 5 mg. Pharmacy requesting another alternative. Will forward to provider

## 2014-07-14 NOTE — Telephone Encounter (Signed)
Please advise on making change to her Bystolic.  This medication is not on her drug formulary and was denied.  She must have at least tried & failed Atenolol, Carvedilol, and/or Bisoprolol.   She also is on Diovan 320mg  by Dr. Woody Seller & only uses as needed for systolic greater than [id number].  She has follow up scheduled for 08/11/2014.

## 2014-07-14 NOTE — Telephone Encounter (Signed)
Patient aware.

## 2014-07-21 ENCOUNTER — Telehealth (INDEPENDENT_AMBULATORY_CARE_PROVIDER_SITE_OTHER): Payer: Self-pay | Admitting: *Deleted

## 2014-07-21 ENCOUNTER — Other Ambulatory Visit (INDEPENDENT_AMBULATORY_CARE_PROVIDER_SITE_OTHER): Payer: Self-pay | Admitting: *Deleted

## 2014-07-21 DIAGNOSIS — K589 Irritable bowel syndrome without diarrhea: Secondary | ICD-10-CM

## 2014-07-21 NOTE — Telephone Encounter (Signed)
[name] [name] is needing a refill on Lomotil. The return phone number is [phone].

## 2014-07-21 NOTE — Telephone Encounter (Signed)
Patient called and ask for a prescription for the Lomotil. This was last written on 04/15/14.

## 2014-07-21 NOTE — Telephone Encounter (Signed)
A refill request was sent to [facility]. Patient will be call once that is addressed.

## 2014-07-22 MED ORDER — DIPHENOXYLATE-ATROPINE 2.5-0.025 MG PO TABS: ORAL_TABLET | ORAL | Status: DC

## 2014-07-29 ENCOUNTER — Ambulatory Visit (INDEPENDENT_AMBULATORY_CARE_PROVIDER_SITE_OTHER): Payer: Medicare Other | Admitting: Internal Medicine

## 2014-08-05 ENCOUNTER — Encounter (INDEPENDENT_AMBULATORY_CARE_PROVIDER_SITE_OTHER): Payer: Self-pay | Admitting: Internal Medicine

## 2014-08-05 ENCOUNTER — Ambulatory Visit (INDEPENDENT_AMBULATORY_CARE_PROVIDER_SITE_OTHER): Payer: Medicare Other | Admitting: Internal Medicine

## 2014-08-05 VITALS — BP 156/86 | HR 56 | Temp 98.2°F | Ht 63.0 in | Wt 128.8 lb

## 2014-08-05 DIAGNOSIS — K589 Irritable bowel syndrome without diarrhea: Secondary | ICD-10-CM

## 2014-08-05 DIAGNOSIS — R110 Nausea: Secondary | ICD-10-CM

## 2014-08-05 MED ORDER — PROMETHAZINE HCL 25 MG PO TABS: 25.0000 mg | ORAL_TABLET | Freq: Four times a day (QID) | ORAL | Status: DC | PRN

## 2014-08-05 NOTE — Progress Notes (Signed)
Subjective:    Patient ID: [name] [name], female    DOB: 1951/04/26, 64 y.o.   MRN: [id number]  HPI Here today for f/u. Recently underwent an EGD/ED in December for dysphagia to solids and pills.  She tells me her dysphagia is much better. Acid reflux is better since starting the Protonix.  Appetite is good. She has lost about 6 pounds since her last in December. She usually has 1-4 stools a day. She says her stools are loose. She says no of her stools are formed.  She has cramps when she she has a her BMs. Sometimes she will have cramps if she drinks water.  She had been taking IBgard which has helped some.   Her last colonoscopy was in 2010. A few scattered diverticula at sigmoid and descending colon, otherwise, normal examination.RandNo lesion found on exam to acct for patient's iron deficiency anemia, although rectal bleeding might have been secondary to diverticular bleed.  Subjective:   05/07/2014: Procedure: EGD with ED   Indications: Patient is 64 year old Caucasian female who has chronic GERD and now presents with dysphagia with solids and pills. She was recently evaluated in emergency room for chest pain which was felt to be of GI origin. She states she has heartburn at least 2-3 times a week. Patient had been on pantoprazole previously which was discontinued for unclear reasons. She is presently on famotidine.  Impression: Small sliding hiatal hernia with mild changes of reflux esophagitis limited to GE junction but no evidence of ring or stricture formation. Small ulcer at antrum along with multiple erosions. Antral biopsy taken for routine histology. Small amount of food debris in the stomach with patent pylorus which may indicate gastroparesis. Esophagus dilated by passing 56 French Maloney dilator but no mucosal disruption and use.     Review of Systems Past  Medical History  Diagnosis Date  . GERD (gastroesophageal reflux disease)   . Abdominal cramping 09/21/2010  . Chronic diarrhea   . Coronary artery disease     Status post drug eluding stent to the right coronary artery status post in-stent restenosis.  DAPT  . AAA (abdominal aortic aneurysm)   . Cancer 2010    right breast   mastectomy and cheotherapy  . IBS (irritable bowel syndrome)   . Diverticulosis   . Aortic aneurysm, abdominal   . Hypertension   . Arthritis   . Anxiety   . Depression   . Hyperlipidemia     Past Surgical History  Procedure Laterality Date  . Mastectomy  05/27/2008    RIGHT  . Small bowel givens  01/06/2009  . Ct angiography  11/02/2010    ABD &PELV  . Appendectomy  1995  . Colonoscopy    . Cervical fusion      x6  . Cataract extraction w/phaco Left 07/19/2012    Procedure: CATARACT EXTRACTION PHACO AND INTRAOCULAR LENS PLACEMENT (IOC);  Surgeon: Tonny Branch, MD;  Location: [facility];  Service: Ophthalmology;  Laterality: Left;  CDE:9.95  . Coronary angioplasty with stent placement  2000-2010    3 cardiac stent splaced per patient  . Esophagogastroduodenoscopy (egd) with propofol N/A 05/07/2014    Procedure: ESOPHAGOGASTRODUODENOSCOPY (EGD) WITH PROPOFOL;  Surgeon: Rogene Houston, MD;  Location: [facility];  Service: Endoscopy;  Laterality: N/A;  Venia Minks dilation N/A 05/07/2014    Procedure: MALONEY DILATION 56 cm;  Surgeon: Rogene Houston, MD;  Location: [facility];  Service: Endoscopy;  Laterality: N/A;  . Esophageal biopsy N/A 05/07/2014  Procedure: BIOPSY;  Surgeon: Rogene Houston, MD;  Location: [facility];  Service: Endoscopy;  Laterality: N/A;    Allergies  Allergen Reactions  . Amlodipine Besylate Hives  . Doxazosin Mesylate Hives  . Erythromycin Hives  . Lisinopril Hives    REACTION: causes hives  . Metoprolol Tartrate Hives    REACTION: causes hives  . Penicillins Hives  . Prednisone     Patient states that it caused bad abdominal pain  .  Oxycodone Hives and Itching    Benadryl is needed with dose    Current Outpatient Prescriptions on File Prior to Visit  Medication Sig Dispense Refill  . aspirin EC 81 MG tablet Take 1 tablet (81 mg total) by mouth daily. (Patient taking differently: Take 81 mg by mouth every evening. )    . B Complex-C (SUPER B COMPLEX/VITAMIN C PO) Take by mouth daily.    . calcium carbonate (TUMS EX) 750 MG chewable tablet Chew 1 tablet by mouth as needed for heartburn.    . carisoprodol (SOMA) 350 MG tablet Take 350 mg by mouth at bedtime as needed for muscle spasms.     . carvedilol (COREG) 3.125 MG tablet Take 1 tablet (3.125 mg total) by mouth 2 (two) times daily. 180 tablet 3  . cetirizine (ZYRTEC) 10 MG chewable tablet Chew 10 mg by mouth daily.    . clopidogrel (PLAVIX) 75 MG tablet Take 1 tablet (75 mg total) by mouth daily. (Patient taking differently: Take 75 mg by mouth at bedtime. ) 30 tablet 6  . diazepam (VALIUM) 5 MG tablet Take 5-10 mg by mouth 2 (two) times daily. And 2 tablets at night    . dicyclomine (BENTYL) 20 MG tablet Take 1 tablet (20 mg total) by mouth 4 (four) times daily - after meals and at bedtime. 120 tablet 4  . diphenhydrAMINE (BENADRYL) 25 MG tablet Take 25 mg by mouth daily.    . diphenoxylate-atropine (LOMOTIL) 2.5-0.025 MG per tablet TAKE ONE TABLET BY MOUTH 4 TIMES DAILY AS NEEDED FOR DIARRHEA OR LOOSE STOOLS 100 tablet 2  . famotidine (PEPCID) 20 MG tablet Take 1 tablet (20 mg total) by mouth 2 (two) times daily. 60 tablet 5  . furosemide (LASIX) 40 MG tablet Take 1 tablet (40 mg total) by mouth daily as needed (swelling). 30 tablet 6  . gabapentin (NEURONTIN) 600 MG tablet Take 600 mg by mouth 2 (two) times daily.     . hydrOXYzine (VISTARIL) 25 MG capsule Take 25 mg by mouth 2 (two) times daily as needed. For itching    . nitroGLYCERIN (NITROSTAT) 0.4 MG SL tablet Place 1 tablet (0.4 mg total) under the tongue every 5 (five) minutes as needed for chest pain. 25 tablet  3  . oxyCODONE-acetaminophen (PERCOCET/ROXICET) 5-325 MG per tablet Take 1 tablet by mouth every 6 (six) hours as needed. For pain    . pantoprazole (PROTONIX) 40 MG tablet Take 1 tablet (40 mg total) by mouth daily before breakfast. 30 tablet 5  . pravastatin (PRAVACHOL) 40 MG tablet Take 40 mg by mouth at bedtime.     . triamcinolone (NASACORT ALLERGY 24HR) 55 MCG/ACT AERO nasal inhaler Place 2 sprays into the nose daily as needed (for allergies).    . valsartan (DIOVAN) 160 MG tablet Take 160 mg by mouth daily as needed. *only take if systolic level exceeds XX123456    . venlafaxine XR (EFFEXOR-XR) 150 MG 24 hr capsule Take 150 mg by mouth at bedtime.    Marland Kitchen  vitamin E (VITAMIN E) 400 UNIT capsule Take 400 Units by mouth 3 (three) times daily.    Marland Kitchen OVER THE COUNTER MEDICATION IBGuard - Patient states that she takes 4 times daily.    . Probiotic Product (PROBIOTIC DAILY PO) Take by mouth daily.    . promethazine (PHENERGAN) 25 MG tablet Take 1 tablet (25 mg total) by mouth every 6 (six) hours as needed for nausea. (Patient not taking: Reported on 08/05/2014) 30 tablet 0  . [DISCONTINUED] fluticasone (FLONASE) 50 MCG/ACT nasal spray Place 2 sprays into the nose as needed for allergies.      No current facility-administered medications on file prior to visit.        Objective:   Physical Exam Blood pressure 156/86, pulse 56, temperature 98.2 F (36.8 C), height 5\' 3"  (1.6 m), weight 128 lb 12.8 oz (58.423 kg). Alert and oriented. Skin warm and dry. Oral mucosa is moist.   . Sclera anicteric, conjunctivae is pink. Thyroid not enlarged. No cervical lymphadenopathy. Lungs clear. Heart regular rate and rhythm.  Abdomen is soft. Bowel sounds are positive. No hepatomegaly. No abdominal masses felt. No tenderness.  No edema to lower extremities.         Assessment & Plan:  Chronic abdominal pain. She is having 1-4 stools now.  She will try Imodium BID. Continue the Probiotic. Dicyclomine qid.

## 2014-08-05 NOTE — Patient Instructions (Signed)
Imodium BID. Dicyclomine QID. OV in 6 months,.

## 2014-08-11 ENCOUNTER — Ambulatory Visit (INDEPENDENT_AMBULATORY_CARE_PROVIDER_SITE_OTHER): Payer: Medicare Other | Admitting: Cardiovascular Disease

## 2014-08-11 ENCOUNTER — Encounter: Payer: Self-pay | Admitting: Cardiovascular Disease

## 2014-08-11 VITALS — BP 132/84 | HR 84 | Ht 63.0 in | Wt 130.0 lb

## 2014-08-11 DIAGNOSIS — I714 Abdominal aortic aneurysm, without rupture, unspecified: Secondary | ICD-10-CM

## 2014-08-11 DIAGNOSIS — I5032 Chronic diastolic (congestive) heart failure: Secondary | ICD-10-CM

## 2014-08-11 DIAGNOSIS — I25118 Atherosclerotic heart disease of native coronary artery with other forms of angina pectoris: Secondary | ICD-10-CM | POA: Diagnosis not present

## 2014-08-11 DIAGNOSIS — E785 Hyperlipidemia, unspecified: Secondary | ICD-10-CM

## 2014-08-11 DIAGNOSIS — I209 Angina pectoris, unspecified: Secondary | ICD-10-CM

## 2014-08-11 DIAGNOSIS — I10 Essential (primary) hypertension: Secondary | ICD-10-CM | POA: Diagnosis not present

## 2014-08-11 MED ORDER — VALSARTAN 160 MG PO TABS: 160.0000 mg | ORAL_TABLET | Freq: Every day | ORAL | Status: DC | PRN

## 2014-08-11 MED ORDER — CARVEDILOL 6.25 MG PO TABS: 6.2500 mg | ORAL_TABLET | Freq: Two times a day (BID) | ORAL | Status: DC

## 2014-08-11 NOTE — Progress Notes (Signed)
Patient ID: [name] [name], female   DOB: [birth year], 64 y.o.   MRN: [id number]      SUBJECTIVE: The patient is a 64 year old woman with a past medical history significant for coronary artery disease and prior stent placement to the LAD and RCA, abdominal aortic aneurysm, hypertension, hyperlipidemia, breast cancer, and gastroesophageal reflux disease. Coronary angiography in September 2014 demonstrated mild in-stent restenosis in the LAD and RCA. AAA measured 2.9 x 2.9 cm in September 2014 and she is followed by [facility].  She had been doing well since her last visit with me until November 30 when she had some right inframammary chest pain and her blood pressure was checked at that time and systolic readings were in the 190 range. Medicare would not cover Bystolic, and she was switched to Coreg.  She began smoking again in 2010 after her IBS flared. She started smoking while she was driving over the weekend and developed severe right-sided chest pain associated with diaphoresis. She took aspirin and nitroglycerin and it eventually resolved. She denies exertional chest pain and also denies leg swelling.   Review of Systems: As per "subjective", otherwise negative.  Allergies  Allergen Reactions  . Amlodipine Besylate Hives  . Doxazosin Mesylate Hives  . Erythromycin Hives  . Lisinopril Hives    REACTION: causes hives  . Metoprolol Tartrate Hives    REACTION: causes hives  . Penicillins Hives  . Prednisone     Patient states that it caused bad abdominal pain  . Oxycodone Hives and Itching    Benadryl is needed with dose    Current Outpatient Prescriptions  Medication Sig Dispense Refill  . aspirin EC 81 MG tablet Take 1 tablet (81 mg total) by mouth daily. (Patient taking differently: Take 81 mg by mouth every evening. )    . B Complex-C (SUPER B COMPLEX/VITAMIN C PO) Take by mouth daily.    . calcium carbonate (TUMS EX) 750 MG chewable tablet Chew 1 tablet by mouth as needed for  heartburn.    . carisoprodol (SOMA) 350 MG tablet Take 350 mg by mouth at bedtime as needed for muscle spasms.     . carvedilol (COREG) 3.125 MG tablet Take 1 tablet (3.125 mg total) by mouth 2 (two) times daily. 180 tablet 3  . cetirizine (ZYRTEC) 10 MG chewable tablet Chew 10 mg by mouth daily.    . clopidogrel (PLAVIX) 75 MG tablet Take 1 tablet (75 mg total) by mouth daily. (Patient taking differently: Take 75 mg by mouth at bedtime. ) 30 tablet 6  . diazepam (VALIUM) 5 MG tablet Take 5-10 mg by mouth 2 (two) times daily. And 2 tablets at night    . dicyclomine (BENTYL) 20 MG tablet Take 1 tablet (20 mg total) by mouth 4 (four) times daily - after meals and at bedtime. 120 tablet 4  . diphenhydrAMINE (BENADRYL) 25 MG tablet Take 25 mg by mouth daily.    . diphenoxylate-atropine (LOMOTIL) 2.5-0.025 MG per tablet TAKE ONE TABLET BY MOUTH 4 TIMES DAILY AS NEEDED FOR DIARRHEA OR LOOSE STOOLS 100 tablet 2  . famotidine (PEPCID) 20 MG tablet Take 1 tablet (20 mg total) by mouth 2 (two) times daily. 60 tablet 5  . fexofenadine (ALLEGRA) 180 MG tablet Take 180 mg by mouth daily.    . furosemide (LASIX) 40 MG tablet Take 1 tablet (40 mg total) by mouth daily as needed (swelling). 30 tablet 6  . gabapentin (NEURONTIN) 600 MG tablet Take 600 mg by mouth  2 (two) times daily.     . hydrOXYzine (VISTARIL) 25 MG capsule Take 25 mg by mouth 2 (two) times daily as needed. For itching    . loperamide (IMODIUM) 2 MG capsule Take by mouth as needed for diarrhea or loose stools.    . nitroGLYCERIN (NITROSTAT) 0.4 MG SL tablet Place 1 tablet (0.4 mg total) under the tongue every 5 (five) minutes as needed for chest pain. 25 tablet 3  . OVER THE COUNTER MEDICATION IBGuard - Patient states that she takes 4 times daily.    Marland Kitchen oxyCODONE-acetaminophen (PERCOCET/ROXICET) 5-325 MG per tablet Take 1 tablet by mouth every 6 (six) hours as needed. For pain    . pantoprazole (PROTONIX) 40 MG tablet Take 1 tablet (40 mg total)  by mouth daily before breakfast. 30 tablet 5  . pravastatin (PRAVACHOL) 40 MG tablet Take 40 mg by mouth at bedtime.     . Probiotic Product (PROBIOTIC DAILY PO) Take by mouth daily.    . promethazine (PHENERGAN) 25 MG tablet Take 1 tablet (25 mg total) by mouth every 6 (six) hours as needed for nausea. 30 tablet 0  . triamcinolone (NASACORT ALLERGY 24HR) 55 MCG/ACT AERO nasal inhaler Place 2 sprays into the nose daily as needed (for allergies).    . valsartan (DIOVAN) 160 MG tablet Take 160 mg by mouth daily as needed. *only take if systolic level exceeds [id number]    . venlafaxine XR (EFFEXOR-XR) 150 MG 24 hr capsule Take 150 mg by mouth at bedtime.    . vitamin E (VITAMIN E) 400 UNIT capsule Take 400 Units by mouth 3 (three) times daily.    . [DISCONTINUED] fluticasone (FLONASE) 50 MCG/ACT nasal spray Place 2 sprays into the nose as needed for allergies.      No current facility-administered medications for this visit.    Past Medical History  Diagnosis Date  . GERD (gastroesophageal reflux disease)   . Abdominal cramping 09/21/2010  . Chronic diarrhea   . Coronary artery disease     Status post drug eluding stent to the right coronary artery status post in-stent restenosis.  DAPT  . AAA (abdominal aortic aneurysm)   . Cancer 2010    right breast   mastectomy and cheotherapy  . IBS (irritable bowel syndrome)   . Diverticulosis   . Aortic aneurysm, abdominal   . Hypertension   . Arthritis   . Anxiety   . Depression   . Hyperlipidemia     Past Surgical History  Procedure Laterality Date  . Mastectomy  05/27/2008    RIGHT  . Small bowel givens  01/06/2009  . Ct angiography  11/02/2010    ABD &PELV  . Appendectomy  1995  . Colonoscopy    . Cervical fusion      x6  . Cataract extraction w/phaco Left 07/19/2012    Procedure: CATARACT EXTRACTION PHACO AND INTRAOCULAR LENS PLACEMENT (IOC);  Surgeon: Tonny Branch, MD;  Location: [facility];  Service: Ophthalmology;  Laterality: Left;   CDE:9.95  . Coronary angioplasty with stent placement  2000-2010    3 cardiac stent splaced per patient  . Esophagogastroduodenoscopy (egd) with propofol N/A 05/07/2014    Procedure: ESOPHAGOGASTRODUODENOSCOPY (EGD) WITH PROPOFOL;  Surgeon: Rogene Houston, MD;  Location: [facility];  Service: Endoscopy;  Laterality: N/A;  Venia Minks dilation N/A 05/07/2014    Procedure: MALONEY DILATION 56 cm;  Surgeon: Rogene Houston, MD;  Location: [facility];  Service: Endoscopy;  Laterality: N/A;  . Esophageal  biopsy N/A 05/07/2014    Procedure: BIOPSY;  Surgeon: Rogene Houston, MD;  Location: [facility];  Service: Endoscopy;  Laterality: N/A;    History   Social History  . Marital Status: Married    Spouse Name: N/A  . Number of Children: N/A  . Years of Education: N/A   Occupational History  . Not on file.   Social History Main Topics  . Smoking status: Current Every Day Smoker -- 1.00 packs/day for 45 years    Types: Cigarettes    Start date: 06/04/1965  . Smokeless tobacco: Never Used     Comment: using e cigs, but she does smoke  . Alcohol Use: No  . Drug Use: No  . Sexual Activity: Not on file   Other Topics Concern  . Not on file   Social History Narrative     Filed Vitals:   08/11/14 1429  BP: 132/84  Pulse: 84  Height: 5\' 3"  (1.6 m)  Weight: 130 lb (58.968 kg)  SpO2: 97%    PHYSICAL EXAM General: NAD HEENT: Normal. Neck: No JVD, no thyromegaly. Lungs: Diminished b/l, no rales. CV: Nondisplaced PMI.  Regular rate and rhythm, normal S1/S2, no S3/S4, no murmur. No pretibial or periankle edema.    Abdomen: Soft, nontender, no distention.  Neurologic: Alert and oriented x 3.  Psych: Normal affect. Skin: Ecchymosis on ventral aspect of left forearm. Musculoskeletal: Normal range of motion, no gross deformities. Extremities: No clubbing or cyanosis.   ECG: Most recent ECG reviewed.      ASSESSMENT AND PLAN: 1. Chest pains in context of CAD with prior stenting of LAD and  RCA with mild instent restenosis in 01/2013: Has had atypical chest pains recently, right-sided, and non-exertional. Continue aspirin 81 mg daily, Plavix 75 mg daily, and pravastatin 40 mg daily. Will increase Coreg to 6.25 mg bid. 2. Essential HTN: Well controlled today but has had markedly elevated readings at home. Increase Coreg to 6.25 mg bid and continue valsartan 160 mg. 3. Hyperlipidemia: Will check lipids if not done recently. For the time being continue pravastatin 40 mg daily. 4. AAA: Followed by [facility]. No dilatation in 03/2014.  Dispo: f/u 3 months.   Kate Sable, M.D., F.A.C.C.

## 2014-08-11 NOTE — Patient Instructions (Addendum)
   Increase Coreg to 6.25mg  twice a day - new sent to pharmacy today.  Refill placed on file for Diovan. Continue all other medications.   Lab for lipids - order given today.  Reminder:  Nothing to eat or drink after 12 midnight prior to labs. Office will contact with results via phone or letter.   Follow up in  3 months

## 2014-08-19 ENCOUNTER — Telehealth (INDEPENDENT_AMBULATORY_CARE_PROVIDER_SITE_OTHER): Payer: Self-pay | Admitting: *Deleted

## 2014-08-19 NOTE — Telephone Encounter (Signed)
08/18/2014 ([name] [name]) [name] [phone] Problem with medication and quanity of Lomotil   she got a written rx for #100 on 07/24/2014 and was not to refill for 30 days.  She was to take 1 four times a day as needed for diarrhea.  Having a time and had to take Kaopetake and Imodium some to help. Requesting a new RX  Needs new Rx 120 with refills.

## 2014-08-20 ENCOUNTER — Other Ambulatory Visit (INDEPENDENT_AMBULATORY_CARE_PROVIDER_SITE_OTHER): Payer: Self-pay | Admitting: *Deleted

## 2014-08-20 DIAGNOSIS — K589 Irritable bowel syndrome without diarrhea: Secondary | ICD-10-CM

## 2014-08-20 MED ORDER — DIPHENOXYLATE-ATROPINE 2.5-0.025 MG PO TABS: ORAL_TABLET | ORAL | Status: DC

## 2014-08-20 NOTE — Telephone Encounter (Signed)
This will be addressed with Dr.Rehman. Patient will be made aware when Rx is ready/decision of Dr.Rehman.

## 2014-08-20 NOTE — Telephone Encounter (Signed)
Per Dr.Rehman may refill this medication.

## 2014-08-26 ENCOUNTER — Other Ambulatory Visit (INDEPENDENT_AMBULATORY_CARE_PROVIDER_SITE_OTHER): Payer: Self-pay | Admitting: Internal Medicine

## 2014-08-26 ENCOUNTER — Telehealth (INDEPENDENT_AMBULATORY_CARE_PROVIDER_SITE_OTHER): Payer: Self-pay | Admitting: *Deleted

## 2014-08-26 DIAGNOSIS — K589 Irritable bowel syndrome without diarrhea: Secondary | ICD-10-CM

## 2014-08-26 MED ORDER — DICYCLOMINE HCL 20 MG PO TABS: 20.0000 mg | ORAL_TABLET | Freq: Three times a day (TID) | ORAL | Status: DC

## 2014-08-26 NOTE — Telephone Encounter (Signed)
Is needing her dicyclomine refilled. The return phone number is [phone].

## 2014-08-29 ENCOUNTER — Telehealth: Payer: Self-pay | Admitting: *Deleted

## 2014-08-29 NOTE — Telephone Encounter (Signed)
-----   Message from Laurine Blazer, LPN sent at [id number]  8:19 AM EDT ----- Regarding: LIPIDS  ORDER GIVEN AT [facility] ON 3/28

## 2014-08-29 NOTE — Telephone Encounter (Signed)
Left message to return call 

## 2014-09-01 NOTE — Telephone Encounter (Signed)
Call returned to patient.  Stated that she has not had cholesterol labs done yet.    Also, c/o chest pain, feels like electric shocks & has taken 3 Nitroglycerin today without relief.  Advised to go to ED for evaluation.  Patient verbalized understanding & says she will go to [facility].

## 2014-09-01 NOTE — Telephone Encounter (Signed)
Returning call - please call back on cell #.

## 2014-09-16 NOTE — Patient Instructions (Signed)
[name] [name]  09/16/2014   Your procedure is scheduled on:  09/22/2014  Report to [facility] at 9:00 AM.  Call this number if you have problems the morning of surgery: [phone]   Remember:   Do not eat food or drink liquids after midnight.   Take these medicines the morning of surgery with A SIP OF WATER: Soma, Coreg, Zyrtec, Valium, Bentyl, Effexor, Pepcid, Gabapentin, Vistaril, Oxycodone, Protonix, Nasocort   Do not wear jewelry, make-up or nail polish.  Do not wear lotions, powders, or perfumes. You may wear deodorant.  Do not shave 48 hours prior to surgery. Men may shave face and neck.  Do not bring valuables to the hospital.  [facility] is not responsible for any belongings or valuables.               Contacts, dentures or bridgework may not be worn into surgery.  Leave suitcase in the car. After surgery it may be brought to your room.  For patients admitted to the hospital, discharge time is determined by your treatment team.               Patients discharged the day of surgery will not be allowed to drive home.  Name and phone number of your driver:   Special Instructions: N/A   Please read over the following fact sheets that you were given: Anesthesia Post-op Instructions   PATIENT INSTRUCTIONS POST-ANESTHESIA  IMMEDIATELY FOLLOWING SURGERY:  Do not drive or operate machinery for the first twenty four hours after surgery.  Do not make any important decisions for twenty four hours after surgery or while taking narcotic pain medications or sedatives.  If you develop intractable nausea and vomiting or a severe headache please notify your doctor immediately.  FOLLOW-UP:  Please make an appointment with your surgeon as instructed. You do not need to follow up with anesthesia unless specifically instructed to do so.  WOUND CARE INSTRUCTIONS (if applicable):  Keep a dry clean dressing on the anesthesia/puncture wound site if there is drainage.  Once the wound has quit draining  you may leave it open to air.  Generally you should leave the bandage intact for twenty four hours unless there is drainage.  If the epidural site drains for more than 36-48 hours please call the anesthesia department.  QUESTIONS?:  Please feel free to call your physician or the hospital operator if you have any questions, and they will be happy to assist you.      Cataract Surgery  A cataract is a clouding of the lens of the eye. When a lens becomes cloudy, vision is reduced based on the degree and nature of the clouding. Surgery may be needed to improve vision. Surgery removes the cloudy lens and usually replaces it with a substitute lens (intraocular lens, IOL). LET YOUR EYE DOCTOR KNOW ABOUT:  Allergies to food or medicine.  Medicines taken including herbs, eye drops, over-the-counter medicines, and creams.  Use of steroids (by mouth or creams).  Previous problems with anesthetics or numbing medicine.  History of bleeding problems or blood clots.  Previous surgery.  Other health problems, including diabetes and kidney problems.  Possibility of pregnancy, if this applies. RISKS AND COMPLICATIONS  Infection.  Inflammation of the eyeball (endophthalmitis) that can spread to both eyes (sympathetic ophthalmia).  Poor wound healing.  If an IOL is inserted, it can later fall out of proper position. This is very uncommon.  Clouding of the part of your eye that  holds an IOL in place. This is called an "after-cataract." These are uncommon but easily treated. BEFORE THE PROCEDURE  Do not eat or drink anything except small amounts of water for 8 to 12 before your surgery, or as directed by your caregiver.  Unless you are told otherwise, continue any eye drops you have been prescribed.  Talk to your primary caregiver about all other medicines that you take (both prescription and nonprescription). In some cases, you may need to stop or change medicines near the time of your surgery.  This is most important if you are taking blood-thinning medicine.Do not stop medicines unless you are told to do so.  Arrange for someone to drive you to and from the procedure.  Do not put contact lenses in either eye on the day of your surgery. PROCEDURE There is more than one method for safely removing a cataract. Your doctor can explain the differences and help determine which is best for you. Phacoemulsification surgery is the most common form of cataract surgery.  An injection is given behind the eye or eye drops are given to make this a painless procedure.  A small cut (incision) is made on the edge of the clear, dome-shaped surface that covers the front of the eye (cornea).  A tiny probe is painlessly inserted into the eye. This device gives off ultrasound waves that soften and break up the cloudy center of the lens. This makes it easier for the cloudy lens to be removed by suction.  An IOL may be implanted.  The normal lens of the eye is covered by a clear capsule. Part of that capsule is intentionally left in the eye to support the IOL.  Your surgeon may or may not use stitches to close the incision. There are other forms of cataract surgery that require a larger incision and stitches to close the eye. This approach is taken in cases where the doctor feels that the cataract cannot be easily removed using phacoemulsification. AFTER THE PROCEDURE  When an IOL is implanted, it does not need care. It becomes a permanent part of your eye and cannot be seen or felt.  Your doctor will schedule follow-up exams to check on your progress.  Review your other medicines with your doctor to see which can be resumed after surgery.  Use eye drops or take medicine as prescribed by your doctor. Document Released: 04/21/2011 Document Revised: 09/16/2013 Document Reviewed: 04/21/2011 [facility] Patient Information 2015 [place], [place]. This information is not intended to replace advice given to  you by your health care provider. Make sure you discuss any questions you have with your health care provider.

## 2014-09-17 ENCOUNTER — Encounter (HOSPITAL_COMMUNITY)
Admission: RE | Admit: 2014-09-17 | Discharge: 2014-09-17 | Disposition: A | Discharge status: Home / Self Care | Payer: Medicare Other | Source: Ambulatory Visit | Attending: Ophthalmology | Admitting: Ophthalmology

## 2014-09-22 ENCOUNTER — Encounter (HOSPITAL_COMMUNITY): Admission: RE | Payer: Self-pay | Source: Ambulatory Visit

## 2014-09-22 ENCOUNTER — Ambulatory Visit (HOSPITAL_COMMUNITY): Admission: RE | Admit: 2014-09-22 | Payer: Medicare Other | Source: Ambulatory Visit | Admitting: Ophthalmology

## 2014-09-22 SURGERY — PHACOEMULSIFICATION, CATARACT, WITH IOL INSERTION
Anesthesia: Monitor Anesthesia Care | Site: Eye | Laterality: Right

## 2014-10-24 ENCOUNTER — Ambulatory Visit: Payer: Medicare Other | Admitting: Cardiovascular Disease

## 2014-10-27 ENCOUNTER — Emergency Department (HOSPITAL_COMMUNITY)
Admission: EM | Admit: 2014-10-27 | Discharge: 2014-10-27 | Disposition: A | Discharge status: Home / Self Care | Payer: Medicare Other | Attending: Emergency Medicine | Admitting: Emergency Medicine

## 2014-10-27 ENCOUNTER — Ambulatory Visit (INDEPENDENT_AMBULATORY_CARE_PROVIDER_SITE_OTHER): Payer: Medicare Other | Admitting: Internal Medicine

## 2014-10-27 ENCOUNTER — Encounter (HOSPITAL_COMMUNITY): Payer: Self-pay | Admitting: Emergency Medicine

## 2014-10-27 ENCOUNTER — Emergency Department (HOSPITAL_COMMUNITY): Payer: Medicare Other

## 2014-10-27 DIAGNOSIS — Z9861 Coronary angioplasty status: Secondary | ICD-10-CM | POA: Diagnosis not present

## 2014-10-27 DIAGNOSIS — F419 Anxiety disorder, unspecified: Secondary | ICD-10-CM | POA: Insufficient documentation

## 2014-10-27 DIAGNOSIS — K219 Gastro-esophageal reflux disease without esophagitis: Secondary | ICD-10-CM | POA: Insufficient documentation

## 2014-10-27 DIAGNOSIS — Z7902 Long term (current) use of antithrombotics/antiplatelets: Secondary | ICD-10-CM | POA: Diagnosis not present

## 2014-10-27 DIAGNOSIS — E785 Hyperlipidemia, unspecified: Secondary | ICD-10-CM | POA: Insufficient documentation

## 2014-10-27 DIAGNOSIS — F329 Major depressive disorder, single episode, unspecified: Secondary | ICD-10-CM | POA: Insufficient documentation

## 2014-10-27 DIAGNOSIS — I714 Abdominal aortic aneurysm, without rupture, unspecified: Secondary | ICD-10-CM

## 2014-10-27 DIAGNOSIS — R109 Unspecified abdominal pain: Secondary | ICD-10-CM

## 2014-10-27 DIAGNOSIS — R1084 Generalized abdominal pain: Secondary | ICD-10-CM | POA: Diagnosis present

## 2014-10-27 DIAGNOSIS — Z720 Tobacco use: Secondary | ICD-10-CM | POA: Insufficient documentation

## 2014-10-27 DIAGNOSIS — I2510 Atherosclerotic heart disease of native coronary artery without angina pectoris: Secondary | ICD-10-CM | POA: Insufficient documentation

## 2014-10-27 DIAGNOSIS — Z79899 Other long term (current) drug therapy: Secondary | ICD-10-CM | POA: Insufficient documentation

## 2014-10-27 DIAGNOSIS — Z7982 Long term (current) use of aspirin: Secondary | ICD-10-CM | POA: Diagnosis not present

## 2014-10-27 DIAGNOSIS — Z880 Allergy status to penicillin: Secondary | ICD-10-CM | POA: Insufficient documentation

## 2014-10-27 DIAGNOSIS — Z853 Personal history of malignant neoplasm of breast: Secondary | ICD-10-CM | POA: Insufficient documentation

## 2014-10-27 DIAGNOSIS — I10 Essential (primary) hypertension: Secondary | ICD-10-CM | POA: Insufficient documentation

## 2014-10-27 DIAGNOSIS — M1990 Unspecified osteoarthritis, unspecified site: Secondary | ICD-10-CM | POA: Insufficient documentation

## 2014-10-27 LAB — CBC WITH DIFFERENTIAL/PLATELET
BASOS ABS: 0 10*3/uL (ref 0.0–0.1)
BASOS PCT: 0 % (ref 0–1)
Eosinophils Absolute: 0.1 10*3/uL (ref 0.0–0.7)
Eosinophils Relative: 1 % (ref 0–5)
HCT: 45.1 % (ref 36.0–46.0)
HEMOGLOBIN: 15.4 g/dL — AB (ref 12.0–15.0)
Lymphocytes Relative: 27 % (ref 12–46)
Lymphs Abs: 2.3 10*3/uL (ref 0.7–4.0)
MCH: 30.8 pg (ref 26.0–34.0)
MCHC: 34.1 g/dL (ref 30.0–36.0)
MCV: 90.2 fL (ref 78.0–100.0)
MONOS PCT: 9 % (ref 3–12)
Monocytes Absolute: 0.8 10*3/uL (ref 0.1–1.0)
NEUTROS ABS: 5.4 10*3/uL (ref 1.7–7.7)
Neutrophils Relative %: 63 % (ref 43–77)
Platelets: 223 10*3/uL (ref 150–400)
RBC: 5 MIL/uL (ref 3.87–5.11)
RDW: 14.1 % (ref 11.5–15.5)
WBC: 8.5 10*3/uL (ref 4.0–10.5)

## 2014-10-27 LAB — COMPREHENSIVE METABOLIC PANEL
ALT: 23 U/L (ref 14–54)
AST: 33 U/L (ref 15–41)
Albumin: 4.1 g/dL (ref 3.5–5.0)
Alkaline Phosphatase: 103 U/L (ref 38–126)
Anion gap: 14 (ref 5–15)
BUN: 38 mg/dL — ABNORMAL HIGH (ref 6–20)
CO2: 22 mmol/L (ref 22–32)
Calcium: 9.6 mg/dL (ref 8.9–10.3)
Chloride: 102 mmol/L (ref 101–111)
Creatinine, Ser: 0.99 mg/dL (ref 0.44–1.00)
GFR calc non Af Amer: 58 mL/min — ABNORMAL LOW (ref >60)
GLUCOSE: 148 mg/dL — AB (ref 65–99)
POTASSIUM: 3 mmol/L — AB (ref 3.5–5.1)
Sodium: 138 mmol/L (ref 135–145)
TOTAL PROTEIN: 7.5 g/dL (ref 6.5–8.1)
Total Bilirubin: 0.5 mg/dL (ref 0.3–1.2)

## 2014-10-27 LAB — URINALYSIS, ROUTINE W REFLEX MICROSCOPIC
Bilirubin Urine: NEGATIVE
GLUCOSE, UA: NEGATIVE mg/dL
KETONES UR: NEGATIVE mg/dL
Leukocytes, UA: NEGATIVE
Nitrite: NEGATIVE
PH: 6 (ref 5.0–8.0)
SPECIFIC GRAVITY, URINE: 1.01 (ref 1.005–1.030)
UROBILINOGEN UA: 0.2 mg/dL (ref 0.0–1.0)

## 2014-10-27 LAB — URINE MICROSCOPIC-ADD ON

## 2014-10-27 LAB — LIPASE, BLOOD: LIPASE: 22 U/L (ref 22–51)

## 2014-10-27 MED ORDER — DIPHENOXYLATE-ATROPINE 2.5-0.025 MG PO TABS
1.0000 | ORAL_TABLET | Freq: Once | ORAL | Status: AC
  Administered 2014-10-27: 1 via ORAL
  Filled 2014-10-27: qty 1

## 2014-10-27 MED ORDER — ONDANSETRON HCL 4 MG/2ML IJ SOLN
4.0000 mg | Freq: Once | INTRAMUSCULAR | Status: AC
  Administered 2014-10-27: 4 mg via INTRAVENOUS
  Filled 2014-10-27: qty 2

## 2014-10-27 MED ORDER — HYDROMORPHONE HCL 1 MG/ML IJ SOLN
1.0000 mg | Freq: Once | INTRAMUSCULAR | Status: AC
  Administered 2014-10-27: 1 mg via INTRAVENOUS
  Filled 2014-10-27: qty 1

## 2014-10-27 MED ORDER — PROMETHAZINE HCL 25 MG/ML IJ SOLN
12.5000 mg | Freq: Once | INTRAMUSCULAR | Status: AC
  Administered 2014-10-27: 12.5 mg via INTRAVENOUS
  Filled 2014-10-27: qty 1

## 2014-10-27 MED ORDER — IOHEXOL 300 MG/ML  SOLN
100.0000 mL | Freq: Once | INTRAMUSCULAR | Status: AC | PRN
  Administered 2014-10-27: 100 mL via INTRAVENOUS

## 2014-10-27 MED ORDER — SODIUM CHLORIDE 0.9 % IV BOLUS (SEPSIS)
1500.0000 mL | Freq: Once | INTRAVENOUS | Status: AC
  Administered 2014-10-27: 1500 mL via INTRAVENOUS

## 2014-10-27 NOTE — Discharge Instructions (Signed)
Irritable Bowel Syndrome Irritable bowel syndrome (IBS) is caused by a disturbance of normal bowel function and is a common digestive disorder. You may also hear this condition called spastic colon, mucous colitis, and irritable colon. There is no cure for IBS. However, symptoms often gradually improve or disappear with a good diet, stress management, and medicine. This condition usually appears in late adolescence or early adulthood. Women develop it twice as often as men. CAUSES  After food has been digested and absorbed in the small intestine, waste material is moved into the large intestine, or colon. In the colon, water and salts are absorbed from the undigested products coming from the small intestine. The remaining residue, or fecal material, is held for elimination. Under normal circumstances, gentle, rhythmic contractions of the bowel walls push the fecal material along the colon toward the rectum. In IBS, however, these contractions are irregular and poorly coordinated. The fecal material is either retained too long, resulting in constipation, or expelled too soon, producing diarrhea. SIGNS AND SYMPTOMS  The most common symptom of IBS is abdominal pain. It is often in the lower left side of the abdomen, but it may occur anywhere in the abdomen. The pain comes from spasms of the bowel muscles happening too much and from the buildup of gas and fecal material in the colon. This pain:  Can range from sharp abdominal cramps to a dull, continuous ache.  Often worsens soon after eating.  Is often relieved by having a bowel movement or passing gas. Abdominal pain is usually accompanied by constipation, but it may also produce diarrhea. The diarrhea often occurs right after a meal or upon waking up in the morning. The stools are often soft, watery, and flecked with mucus. Other symptoms of IBS include:  Bloating.  Loss of appetite.  Heartburn.  Backache.  Dull pain in the arms or  shoulders.  Nausea.  Burping.  Vomiting.  Gas. IBS may also cause symptoms that are unrelated to the digestive system, such as:  Fatigue.  Headaches.  Anxiety.  Shortness of breath.  Trouble concentrating.  Dizziness. These symptoms tend to come and go. DIAGNOSIS  The symptoms of IBS may seem like symptoms of other, more serious digestive disorders. Your health care provider may want to perform tests to exclude these disorders.  TREATMENT Many medicines are available to help correct bowel function or relieve bowel spasms and abdominal pain. Among the medicines available are:  Laxatives for severe constipation and to help restore normal bowel habits.  Specific antidiarrheal medicines to treat severe or lasting diarrhea.  Antispasmodic agents to relieve intestinal cramps. Your health care provider may also decide to treat you with a mild tranquilizer or sedative during unusually stressful periods in your life. Your health care provider may also prescribe antidepressant medicine. The use of this medicine has been shown to reduce pain and other symptoms of IBS. Remember that if any medicine is prescribed for you, you should take it exactly as directed. Make sure your health care provider knows how well it worked for you. HOME CARE INSTRUCTIONS   Take all medicines as directed by your health care provider.  Avoid foods that are high in fat or oils, such as heavy cream, butter, frankfurters, sausage, and other fatty meats.  Avoid foods that make you go to the bathroom, such as fruit, fruit juice, and dairy products.  Cut out carbonated drinks, chewing gum, and "gassy" foods such as beans and cabbage. This may help relieve bloating and burping.    Eat foods with bran, and drink plenty of liquids with the bran foods. This helps relieve constipation.  Keep track of what foods seem to bring on your symptoms.  Avoid emotionally charged situations or circumstances that produce  anxiety.  Start or continue exercising.  Get plenty of rest and sleep. Document Released: 05/02/2005 Document Revised: 05/07/2013 Document Reviewed: 12/21/2007 [employer] Patient Information 2015 [employer]. This information is not intended to replace advice given to you by your health care provider. Make sure you discuss any questions you have with your health care provider.  

## 2014-10-27 NOTE — ED Notes (Signed)
Pt states that she has been having abdominal pain since last Wednesday.  States that she is out of her Lomotil and Bentyl.

## 2014-10-27 NOTE — ED Provider Notes (Signed)
CSN: [id number]     Arrival date & time 10/27/14  1016 History  This chart was scribed for [name] Schmidt, MD by Eustaquio Maize, ED Scribe. This patient was seen in room [facility]12[facility]12 and the patient's care was started at 10:55 AM.   Chief Complaint  Patient presents with  . Abdominal Pain   The history is provided by the patient and the spouse. No language interpreter was used.    HPI Comments: [name] [name] is a 64 y.o. female with hx IBS-D who presents to the Emergency Department complaining of diffuse abdominal pain x 6 days. She describes it as a cramping sensation. Pt has never had these symptoms in the past. She also complains of diarrhea, nausea, and vomiting. She usually takes Lomotil and Bentyl for symptoms but has not taken any since Wednesday, 10/22/2014 (approxiamtely 6 days ago) due to feeling sick. Pt also takes Valium and Percocet for chronic pain but has been unable to keep the medication down since onset of symptoms. Denies fever, chills, or any other associated symptoms. PSHx appendectomy.   Past Medical History  Diagnosis Date  . GERD (gastroesophageal reflux disease)   . Abdominal cramping 09/21/2010  . Chronic diarrhea   . Coronary artery disease     Status post drug eluding stent to the right coronary artery status post in-stent restenosis.  DAPT  . AAA (abdominal aortic aneurysm)   . Cancer 2010    right breast   mastectomy and cheotherapy  . IBS (irritable bowel syndrome)   . Diverticulosis   . Aortic aneurysm, abdominal   . Hypertension   . Arthritis   . Anxiety   . Depression   . Hyperlipidemia    Past Surgical History  Procedure Laterality Date  . Mastectomy  05/27/2008    RIGHT  . Small bowel givens  01/06/2009  . Ct angiography  11/02/2010    ABD &PELV  . Appendectomy  1995  . Colonoscopy    . Cervical fusion      x6  . Cataract extraction w/phaco Left 07/19/2012    Procedure: CATARACT EXTRACTION PHACO AND INTRAOCULAR LENS PLACEMENT (IOC);  Surgeon:  Tonny Branch, MD;  Location: [facility];  Service: Ophthalmology;  Laterality: Left;  CDE:9.95  . Coronary angioplasty with stent placement  2000-2010    3 cardiac stent splaced per patient  . Esophagogastroduodenoscopy (egd) with propofol N/A 05/07/2014    Procedure: ESOPHAGOGASTRODUODENOSCOPY (EGD) WITH PROPOFOL;  Surgeon: Rogene Houston, MD;  Location: [facility];  Service: Endoscopy;  Laterality: N/A;  Venia Minks dilation N/A 05/07/2014    Procedure: MALONEY DILATION 56 cm;  Surgeon: Rogene Houston, MD;  Location: [facility];  Service: Endoscopy;  Laterality: N/A;  . Esophageal biopsy N/A 05/07/2014    Procedure: BIOPSY;  Surgeon: Rogene Houston, MD;  Location: [facility];  Service: Endoscopy;  Laterality: N/A;   Family History  Problem Relation Age of Onset  . Diverticulitis Mother   . Heart disease Father   . Aortic aneurysm Father   . Heart disease Brother   . Aortic aneurysm Brother   . Crohn's disease Brother    History  Substance Use Topics  . Smoking status: Current Every Day Smoker -- 1.00 packs/day for 45 years    Types: Cigarettes    Start date: 06/04/1965  . Smokeless tobacco: Never Used     Comment: using e cigs, but she does smoke  . Alcohol Use: No   OB History    No data  available     Review of Systems  A complete 10 system review of systems was obtained and all systems are negative except as noted in the HPI and PMH.    Allergies  Amlodipine besylate; Doxazosin mesylate; Erythromycin; Lisinopril; Metoprolol tartrate; Penicillins; Prednisone; and Oxycodone  Home Medications   Prior to Admission medications   Medication Sig Start Date End Date Taking? Authorizing Provider  aspirin EC 81 MG tablet Take 1 tablet (81 mg total) by mouth daily. Patient taking differently: Take 81 mg by mouth every evening.  01/09/13  Yes Eugene C Serpe, PA-C  B Complex-C (SUPER B COMPLEX/VITAMIN C PO) Take by mouth daily.   Yes Historical Provider, MD  calcium carbonate (TUMS EX) 750 MG  chewable tablet Chew 1 tablet by mouth as needed for heartburn.   Yes Historical Provider, MD  carisoprodol (SOMA) 350 MG tablet Take 350 mg by mouth at bedtime as needed for muscle spasms.    Yes Historical Provider, MD  carvedilol (COREG) 6.25 MG tablet Take 1 tablet (6.25 mg total) by mouth 2 (two) times daily. 08/11/14  Yes Herminio Commons, MD  cetirizine (ZYRTEC) 10 MG chewable tablet Chew 10 mg by mouth daily.   Yes Historical Provider, MD  clopidogrel (PLAVIX) 75 MG tablet Take 1 tablet (75 mg total) by mouth daily. Patient taking differently: Take 75 mg by mouth at bedtime.  11/11/13  Yes Herminio Commons, MD  diazepam (VALIUM) 5 MG tablet Take 5-10 mg by mouth 2 (two) times daily. And 2 tablets at night 04/11/12  Yes Historical Provider, MD  dicyclomine (BENTYL) 20 MG tablet Take 1 tablet (20 mg total) by mouth 4 (four) times daily - after meals and at bedtime. 08/26/14  Yes Butch Penny, NP  diphenhydrAMINE (BENADRYL) 25 MG tablet Take 25 mg by mouth daily.   Yes Historical Provider, MD  diphenoxylate-atropine (LOMOTIL) 2.5-0.025 MG per tablet TAKE ONE TABLET BY MOUTH 4 TIMES DAILY AS NEEDED FOR DIARRHEA OR LOOSE STOOLS 08/20/14  Yes Rogene Houston, MD  famotidine (PEPCID) 20 MG tablet Take 1 tablet (20 mg total) by mouth 2 (two) times daily. 04/15/14  Yes Rogene Houston, MD  fexofenadine (ALLEGRA) 180 MG tablet Take 180 mg by mouth daily.   Yes Historical Provider, MD  furosemide (LASIX) 40 MG tablet Take 1 tablet (40 mg total) by mouth daily as needed (swelling). 11/11/13  Yes Herminio Commons, MD  gabapentin (NEURONTIN) 600 MG tablet Take 600 mg by mouth 2 (two) times daily.  12/09/10  Yes Historical Provider, MD  hydrOXYzine (VISTARIL) 25 MG capsule Take 25 mg by mouth 2 (two) times daily as needed. For itching 03/13/14  Yes Historical Provider, MD  loperamide (IMODIUM) 2 MG capsule Take by mouth as needed for diarrhea or loose stools.   Yes Historical Provider, MD  nitroGLYCERIN  (NITROSTAT) 0.4 MG SL tablet Place 1 tablet (0.4 mg total) under the tongue every 5 (five) minutes as needed for chest pain. 01/09/13  Yes Donney Dice, PA-C  oxyCODONE-acetaminophen (PERCOCET/ROXICET) 5-325 MG per tablet Take 1 tablet by mouth every 6 (six) hours as needed. For pain 03/15/14  Yes Historical Provider, MD  pantoprazole (PROTONIX) 40 MG tablet Take 1 tablet (40 mg total) by mouth daily before breakfast. 05/07/14  Yes Rogene Houston, MD  pravastatin (PRAVACHOL) 40 MG tablet Take 40 mg by mouth at bedtime.  05/02/11  Yes Ezra Sites, MD  Probiotic Product (PROBIOTIC DAILY PO) Take by  mouth daily.   Yes Historical Provider, MD  promethazine (PHENERGAN) 25 MG tablet Take 1 tablet (25 mg total) by mouth every 6 (six) hours as needed for nausea. 08/05/14  Yes Butch Penny, NP  triamcinolone (NASACORT ALLERGY 24HR) 55 MCG/ACT AERO nasal inhaler Place 2 sprays into the nose daily as needed (for allergies).   Yes Historical Provider, MD  valsartan (DIOVAN) 160 MG tablet Take 1 tablet (160 mg total) by mouth daily as needed. *only take if systolic level exceeds [id number] 08/11/14  Yes Herminio Commons, MD  venlafaxine XR (EFFEXOR-XR) 150 MG 24 hr capsule Take 150 mg by mouth at bedtime.   Yes Historical Provider, MD  vitamin E (VITAMIN E) 400 UNIT capsule Take 400 Units by mouth 3 (three) times daily.   Yes Historical Provider, MD   Triage Vitals: BP 160/93 mmHg  Pulse 96  Temp(Src) 98.5 F (36.9 C) (Oral)  Resp 18  Ht 5\' 3"  (1.6 m)  Wt 130 lb (58.968 kg)  BMI 23.03 kg/m2  SpO2 100%   Physical Exam  Constitutional: She is oriented to person, place, and time. She appears well-developed and well-nourished. No distress.  HENT:  Head: Normocephalic and atraumatic.  Eyes: EOM are normal.  Neck: Normal range of motion.  Cardiovascular: Normal rate, regular rhythm and normal heart sounds.   Pulmonary/Chest: Effort normal and breath sounds normal.  Abdominal: Soft. She exhibits no  distension. There is tenderness.  Diffuse generalized abdominal tenderness.   Musculoskeletal: Normal range of motion.  Neurological: She is alert and oriented to person, place, and time.  Skin: Skin is warm and dry.  Psychiatric: She has a normal mood and affect. Judgment normal.  Nursing note and vitals reviewed.   ED Course  Procedures (including critical care time)  DIAGNOSTIC STUDIES: Oxygen Saturation is 100% on RA, normal by my interpretation.    COORDINATION OF CARE: 10:59 AM-Discussed treatment plan which includes CT A/P, Urine culture, UA, CBC, CMP, Lipase, Zofran, and pain medication with pt at bedside and pt agreed to plan.   Labs Review Labs Reviewed  CBC WITH DIFFERENTIAL/PLATELET - Abnormal; Notable for the following:    Hemoglobin 15.4 (*)    All other components within normal limits  COMPREHENSIVE METABOLIC PANEL - Abnormal; Notable for the following:    Potassium 3.0 (*)    Glucose, Bld 148 (*)    BUN 38 (*)    GFR calc non Af Amer 58 (*)    All other components within normal limits  URINALYSIS, ROUTINE W REFLEX MICROSCOPIC (NOT AT [facility]) - Abnormal; Notable for the following:    Hgb urine dipstick SMALL (*)    Protein, ur TRACE (*)    All other components within normal limits  URINE MICROSCOPIC-ADD ON - Abnormal; Notable for the following:    Squamous Epithelial / LPF FEW (*)    All other components within normal limits  URINE CULTURE  LIPASE, BLOOD    Imaging Review Ct Abdomen Pelvis W Contrast  10/27/2014   CLINICAL DATA:  Nausea, vomiting, and diarrhea for 6 days.  EXAM: CT ABDOMEN AND PELVIS WITH CONTRAST  TECHNIQUE: Multidetector CT imaging of the abdomen and pelvis was performed using the standard protocol following bolus administration of intravenous contrast.  CONTRAST:  174mL OMNIPAQUE IOHEXOL 300 MG/ML  SOLN  COMPARISON:  06/18/2014  FINDINGS: BODY WALL: No contributory findings.  LOWER CHEST: No pneumonia or effusion.  ABDOMEN/PELVIS:  Liver: No  focal abnormality.  Biliary: No evidence of biliary  obstruction or stone.  Pancreas: Fatty atrophy.  No inflammatory change or duct enlargement  Spleen: Unremarkable.  Adrenals: Unremarkable.  Kidneys and ureters: No hydronephrosis or stone. Cm or smaller presumed cysts in the bilateral renal cortex.  Bladder: Unremarkable.  Reproductive: No pathologic findings.  Bowel: Diverticulosis of the distal colon without active inflammation. Apparent colonic wall thickening is likely from underdistention. Appendectomy.  Retroperitoneum: No mass or adenopathy.  Peritoneum: No ascites or pneumoperitoneum.  Vascular: Diffuse aneurysmal enlargement of the abdominal aorta, beginning just below the renal arteries and continuing to the bifurcation. Maximal diameter is stable from previous at 36 x 34 mm. No evidence of leakage or inflammation. No evidence of major vessel occlusion.  OSSEOUS: No acute abnormalities.  IMPRESSION: 1. No acute findings. 2. Colonic diverticulosis. 3. 36 mm abdominal aortic aneurysm. Recommend followup by ultrasound in 2 years. This recommendation follows ACR consensus guidelines: White Paper of the ACR Incidental Findings Committee II on Vascular Findings. J Am Coll Radiol 2013; 10:789-794.   Electronically Signed   By: Monte Fantasia M.D.   On: 10/27/2014 13:00  I personally reviewed the imaging tests through PACS system I reviewed available ER/hospitalization records through the EMR     EKG Interpretation None      MDM   Final diagnoses:  Abdominal pain, unspecified abdominal location  AAA (abdominal aortic aneurysm)    Patient feels much better at time of discharge.  She's keeping fluids down.  Primary care follow-up.  She has pain and nausea medication at home.  I personally performed the services described in this documentation, which was scribed in my presence. The recorded information has been reviewed and is accurate.   I personally performed the services described in this  documentation, which was scribed in my presence. The recorded information has been reviewed and is accurate.       [name] Schmidt, MD 10/27/14 1550

## 2014-10-29 LAB — URINE CULTURE: Colony Count: 30000

## 2014-11-10 ENCOUNTER — Ambulatory Visit (INDEPENDENT_AMBULATORY_CARE_PROVIDER_SITE_OTHER): Payer: Medicare Other | Admitting: Internal Medicine

## 2014-11-11 ENCOUNTER — Ambulatory Visit: Payer: Medicare Other | Admitting: Cardiovascular Disease

## 2014-11-11 ENCOUNTER — Encounter: Payer: Self-pay | Admitting: Cardiovascular Disease

## 2014-12-03 ENCOUNTER — Emergency Department (HOSPITAL_COMMUNITY)
Admission: EM | Admit: 2014-12-03 | Discharge: 2014-12-03 | Disposition: A | Discharge status: Home / Self Care | Payer: Medicare Other | Attending: Emergency Medicine | Admitting: Emergency Medicine

## 2014-12-03 ENCOUNTER — Encounter (HOSPITAL_COMMUNITY): Payer: Self-pay | Admitting: General Practice

## 2014-12-03 DIAGNOSIS — Z853 Personal history of malignant neoplasm of breast: Secondary | ICD-10-CM | POA: Diagnosis not present

## 2014-12-03 DIAGNOSIS — F329 Major depressive disorder, single episode, unspecified: Secondary | ICD-10-CM | POA: Insufficient documentation

## 2014-12-03 DIAGNOSIS — F449 Dissociative and conversion disorder, unspecified: Secondary | ICD-10-CM | POA: Diagnosis not present

## 2014-12-03 DIAGNOSIS — M5412 Radiculopathy, cervical region: Secondary | ICD-10-CM | POA: Diagnosis not present

## 2014-12-03 DIAGNOSIS — Z7902 Long term (current) use of antithrombotics/antiplatelets: Secondary | ICD-10-CM | POA: Diagnosis not present

## 2014-12-03 DIAGNOSIS — Z720 Tobacco use: Secondary | ICD-10-CM | POA: Diagnosis not present

## 2014-12-03 DIAGNOSIS — I10 Essential (primary) hypertension: Secondary | ICD-10-CM | POA: Insufficient documentation

## 2014-12-03 DIAGNOSIS — Z7982 Long term (current) use of aspirin: Secondary | ICD-10-CM | POA: Diagnosis not present

## 2014-12-03 DIAGNOSIS — K219 Gastro-esophageal reflux disease without esophagitis: Secondary | ICD-10-CM | POA: Diagnosis not present

## 2014-12-03 DIAGNOSIS — M1990 Unspecified osteoarthritis, unspecified site: Secondary | ICD-10-CM | POA: Diagnosis not present

## 2014-12-03 DIAGNOSIS — F419 Anxiety disorder, unspecified: Secondary | ICD-10-CM | POA: Insufficient documentation

## 2014-12-03 DIAGNOSIS — I2510 Atherosclerotic heart disease of native coronary artery without angina pectoris: Secondary | ICD-10-CM | POA: Diagnosis not present

## 2014-12-03 DIAGNOSIS — G8929 Other chronic pain: Secondary | ICD-10-CM | POA: Diagnosis not present

## 2014-12-03 DIAGNOSIS — Z79899 Other long term (current) drug therapy: Secondary | ICD-10-CM | POA: Diagnosis not present

## 2014-12-03 DIAGNOSIS — E785 Hyperlipidemia, unspecified: Secondary | ICD-10-CM | POA: Diagnosis not present

## 2014-12-03 DIAGNOSIS — M542 Cervicalgia: Secondary | ICD-10-CM | POA: Diagnosis present

## 2014-12-03 MED ORDER — OXYCODONE-ACETAMINOPHEN 5-325 MG PO TABS
2.0000 | ORAL_TABLET | Freq: Once | ORAL | Status: AC
  Administered 2014-12-03: 2 via ORAL
  Filled 2014-12-03: qty 2

## 2014-12-03 MED ORDER — HYDROMORPHONE HCL 1 MG/ML IJ SOLN
1.0000 mg | Freq: Once | INTRAMUSCULAR | Status: AC
  Administered 2014-12-03: 1 mg via INTRAMUSCULAR
  Filled 2014-12-03: qty 1

## 2014-12-03 MED ORDER — DIAZEPAM 5 MG PO TABS
5.0000 mg | ORAL_TABLET | Freq: Once | ORAL | Status: AC
  Administered 2014-12-03: 5 mg via ORAL
  Filled 2014-12-03: qty 1

## 2014-12-03 MED ORDER — PREDNISONE 20 MG PO TABS
40.0000 mg | ORAL_TABLET | Freq: Once | ORAL | Status: AC
  Administered 2014-12-03: 40 mg via ORAL
  Filled 2014-12-03: qty 2

## 2014-12-03 MED ORDER — PREDNISONE 20 MG PO TABS: ORAL_TABLET | ORAL | Status: DC

## 2014-12-03 MED ORDER — DIPHENHYDRAMINE HCL 25 MG PO CAPS
25.0000 mg | ORAL_CAPSULE | Freq: Once | ORAL | Status: AC
  Administered 2014-12-03: 25 mg via ORAL
  Filled 2014-12-03: qty 1

## 2014-12-03 NOTE — ED Notes (Signed)
Pt able to ambulate to wheelchair in room.  Gait steady and even.  Pt verbalized understanding that she should take her BP medications at home.

## 2014-12-03 NOTE — ED Notes (Signed)
This RN went in to discharge pt.  Pt c/o nausea and wretched into emesis bag.  Pt sts she has not eaten anything today due to feeling so bad.  This RN provided pt with [place] sandwich and water to see if it will help.

## 2014-12-03 NOTE — ED Notes (Addendum)
Pt called out and this RN went in to check on her.  Pt sts she was woken up from her sleep with severe pain in her back and neck (worst in the neck).  Pt sts the medication gave her a short amount of relief, but pain is back and just as bad.  Pt's BP also reading high.  When this RN asked about BP pt's husband states pt has not been taking her BP medications.  This RN asked why pt has not.  Pt sts "because I have been feeling so sick".    MD made aware of conversation.
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
## 2014-12-03 NOTE — ED Notes (Signed)
Pt placed in a gown and hooked up to the monitor with the 5 lead, BP cuff and pulse ox 

## 2014-12-03 NOTE — ED Provider Notes (Signed)
CSN: [id number]     Arrival date & time 12/03/14  1753 History   First MD Initiated Contact with Patient 12/03/14 1837     Chief Complaint  Patient presents with  . Neck Pain     (Consider location/radiation/quality/duration/timing/severity/associated sxs/prior Treatment) HPI The patient describes chronic cervical pain for years with 4 or 5 prior surgical interventions. She states her pain has gotten worse over the past several months. The patient has seen her regular physician who treats her for pain control and has had a MRI within the past 3 weeks. She states she is not due for her follow-up with them until next week. She reports that she gets prescribed Percocet for pain control but it never really helps her. She states she took 2 this morning but cannot recall if that was the last of her prescription. The pain is in the back of her neck and worse with movements. Location of pain has been typical. The patient has not had loss of function of her arms or her legs. She reports due to her pain she is just felt very poorly. She reports that she is in this area helping a friend who has emphysema. Normally she is seen closer to her home. Past Medical History  Diagnosis Date  . GERD (gastroesophageal reflux disease)   . Abdominal cramping 09/21/2010  . Chronic diarrhea   . Coronary artery disease     Status post drug eluding stent to the right coronary artery status post in-stent restenosis.  DAPT  . AAA (abdominal aortic aneurysm)   . Cancer 2010    right breast   mastectomy and cheotherapy  . IBS (irritable bowel syndrome)   . Diverticulosis   . Aortic aneurysm, abdominal   . Hypertension   . Arthritis   . Anxiety   . Depression   . Hyperlipidemia    Past Surgical History  Procedure Laterality Date  . Mastectomy  05/27/2008    RIGHT  . Small bowel givens  01/06/2009  . Ct angiography  11/02/2010    ABD &PELV  . Appendectomy  1995  . Colonoscopy    . Cervical fusion      x6  .  Cataract extraction w/phaco Left 07/19/2012    Procedure: CATARACT EXTRACTION PHACO AND INTRAOCULAR LENS PLACEMENT (IOC);  Surgeon: Tonny Branch, MD;  Location: [facility];  Service: Ophthalmology;  Laterality: Left;  CDE:9.95  . Coronary angioplasty with stent placement  2000-2010    3 cardiac stent splaced per patient  . Esophagogastroduodenoscopy (egd) with propofol N/A 05/07/2014    Procedure: ESOPHAGOGASTRODUODENOSCOPY (EGD) WITH PROPOFOL;  Surgeon: Rogene Houston, MD;  Location: [facility];  Service: Endoscopy;  Laterality: N/A;  Venia Minks dilation N/A 05/07/2014    Procedure: MALONEY DILATION 56 cm;  Surgeon: Rogene Houston, MD;  Location: [facility];  Service: Endoscopy;  Laterality: N/A;  . Esophageal biopsy N/A 05/07/2014    Procedure: BIOPSY;  Surgeon: Rogene Houston, MD;  Location: [facility];  Service: Endoscopy;  Laterality: N/A;   Family History  Problem Relation Age of Onset  . Diverticulitis Mother   . Heart disease Father   . Aortic aneurysm Father   . Heart disease Brother   . Aortic aneurysm Brother   . Crohn's disease Brother    History  Substance Use Topics  . Smoking status: Current Every Day Smoker -- 1.00 packs/day for 45 years    Types: Cigarettes    Start date: 06/04/1965  . Smokeless tobacco: Never  Used     Comment: using e cigs, but she does smoke  . Alcohol Use: No   OB History    No data available     Review of Systems 10 Systems reviewed and are negative for acute change except as noted in the HPI.    Allergies  Percocet; Amlodipine besylate; Doxazosin mesylate; Erythromycin; Lisinopril; Metoprolol tartrate; Oxycodone; and Penicillins  Home Medications   Prior to Admission medications   Medication Sig Start Date End Date Taking? Authorizing Provider  aspirin EC 81 MG tablet Take 1 tablet (81 mg total) by mouth daily. Patient taking differently: Take 81 mg by mouth every evening.  01/09/13  Yes Eugene C Serpe, PA-C  B Complex-C (SUPER B COMPLEX/VITAMIN C  PO) Take 1 tablet by mouth daily.    Yes Historical Provider, MD  carvedilol (COREG) 6.25 MG tablet Take 1 tablet (6.25 mg total) by mouth 2 (two) times daily. 08/11/14  Yes Herminio Commons, MD  cetirizine (ZYRTEC) 10 MG chewable tablet Chew 10 mg by mouth daily.   Yes Historical Provider, MD  clopidogrel (PLAVIX) 75 MG tablet Take 1 tablet (75 mg total) by mouth daily. Patient taking differently: Take 75 mg by mouth at bedtime.  11/11/13  Yes Herminio Commons, MD  diazepam (VALIUM) 5 MG tablet Take 5-10 mg by mouth 2 (two) times daily. Takes 1 tab in am and 2 tablets at night 04/11/12  Yes Historical Provider, MD  dicyclomine (BENTYL) 20 MG tablet Take 1 tablet (20 mg total) by mouth 4 (four) times daily - after meals and at bedtime. Patient taking differently: Take 20 mg by mouth as needed for spasms.  08/26/14  Yes Butch Penny, NP  diphenhydrAMINE (BENADRYL) 25 MG tablet Take 25 mg by mouth daily.   Yes Historical Provider, MD  diphenoxylate-atropine (LOMOTIL) 2.5-0.025 MG per tablet TAKE ONE TABLET BY MOUTH 4 TIMES DAILY AS NEEDED FOR DIARRHEA OR LOOSE STOOLS 08/20/14  Yes Rogene Houston, MD  famotidine (PEPCID) 20 MG tablet Take 1 tablet (20 mg total) by mouth 2 (two) times daily. 04/15/14  Yes Rogene Houston, MD  gabapentin (NEURONTIN) 600 MG tablet Take 600 mg by mouth 2 (two) times daily.  12/09/10  Yes Historical Provider, MD  hydrOXYzine (VISTARIL) 25 MG capsule Take 25 mg by mouth 2 (two) times daily as needed. For itching 03/13/14  Yes Historical Provider, MD  nitroGLYCERIN (NITROSTAT) 0.4 MG SL tablet Place 1 tablet (0.4 mg total) under the tongue every 5 (five) minutes as needed for chest pain. 01/09/13  Yes Donney Dice, PA-C  oxyCODONE-acetaminophen (PERCOCET/ROXICET) 5-325 MG per tablet Take 1 tablet by mouth every 6 (six) hours as needed. For pain 03/15/14  Yes Historical Provider, MD  pantoprazole (PROTONIX) 40 MG tablet Take 1 tablet (40 mg total) by mouth daily before  breakfast. 05/07/14  Yes Rogene Houston, MD  pravastatin (PRAVACHOL) 40 MG tablet Take 40 mg by mouth at bedtime.  05/02/11  Yes Ezra Sites, MD  Probiotic Product (PROBIOTIC DAILY PO) Take 1 capsule by mouth daily.    Yes Historical Provider, MD  promethazine (PHENERGAN) 25 MG tablet Take 1 tablet (25 mg total) by mouth every 6 (six) hours as needed for nausea. 08/05/14  Yes Butch Penny, NP  triamcinolone (NASACORT ALLERGY 24HR) 55 MCG/ACT AERO nasal inhaler Place 2 sprays into the nose daily as needed (for allergies).   Yes Historical Provider, MD  valsartan (DIOVAN) 160 MG tablet Take  1 tablet (160 mg total) by mouth daily as needed. *only take if systolic level exceeds [id number] 08/11/14  Yes Herminio Commons, MD  venlafaxine XR (EFFEXOR-XR) 150 MG 24 hr capsule Take 150 mg by mouth at bedtime.   Yes Historical Provider, MD  vitamin E (VITAMIN E) 400 UNIT capsule Take 400 Units by mouth daily.    Yes Historical Provider, MD  furosemide (LASIX) 40 MG tablet Take 1 tablet (40 mg total) by mouth daily as needed (swelling). 11/11/13   Herminio Commons, MD  predniSONE (DELTASONE) 20 MG tablet 3 tabs po day one, then 2 po daily x 4 days 12/03/14   Charlesetta Shanks, MD   BP 166/114 mmHg  Pulse 95  Temp(Src) 99 F (37.2 C) (Oral)  Resp 18  Ht 5\' 3"  (1.6 m)  Wt 139 lb (63.05 kg)  BMI 24.63 kg/m2  SpO2 93% Physical Exam  Constitutional: She is oriented to person, place, and time.  As I walk in the room the patient is lying on her side conversing on the telephone. She is alert and does not appear to be in acute distress. When she puts the phone down she expresses severe pain. The patient appears somewhat discheveled and poorly groomed.  HENT:  Head: Normocephalic and atraumatic.  Right Ear: External ear normal.  Left Ear: External ear normal.  Mouth/Throat: Oropharynx is clear and moist.  Eyes: EOM are normal. Pupils are equal, round, and reactive to light.  Neck:  Patient has a well-healed  surgical scar in the back of her neck. There is no stridor. No cervical lymphadenopathy. Patient endorses pain with palpation of the posterior cervical spine.  Cardiovascular: Normal rate, regular rhythm, normal heart sounds and intact distal pulses.   Pulmonary/Chest: Effort normal and breath sounds normal.  Abdominal: Soft. Bowel sounds are normal. She exhibits no distension. There is no tenderness.  Musculoskeletal: Normal range of motion. She exhibits no edema or tenderness.  Neurological: She is alert and oriented to person, place, and time. No cranial nerve deficit. She exhibits normal muscle tone. Coordination normal.  Patient performs symmetric upper extremity strength testing for grips and push pulls. Patient can flex and extend both lower extremities without difficulty.  Skin: Skin is warm and dry.  Psychiatric:  The patient is anxious and at one point exhibited purposeful full body shaking activity and moving her head side to side explaining to me that she believes she was going into shock. The patient remained alert and responsive to commands throughout this episode.    ED Course  Procedures (including critical care time) Labs Review Labs Reviewed - No data to display  Imaging Review No results found.   EKG Interpretation None      MDM   Final diagnoses:  Chronic radicular cervical pain  Conversion disorder   Patient presents with chronic pain history and increasing report of pain over several weeks duration. The patient does not have any localizing neurologic deficits. She is exhibiting behaviors of conversion type disorder versus drug seeking. As described above patient went through an episode of purposeful movements and shaking that did illustrate intact motor function of both upper and the lower extremities. Patient was responsive to commands throughout this and had ongoing purposeful speech production throughout the event. At this time I have explained that we can  assist with pain control in the emergency department however no prescriptions can be provided. He is counseled to follow-up with her primary providers of pain management and orthopedic treatment  as soon as possible. The patient's husband explains that she has been noncompliant with her antihypertensives medications, the patient is not exhibiting any acute signs of endorgan damage related to hypertension.    Charlesetta Shanks, MD 12/03/14 2155

## 2014-12-03 NOTE — ED Notes (Signed)
Pt called out again pain medication.  Pt tearful in room.  This RN went in to comfort her and discuss POC.

## 2014-12-03 NOTE — Discharge Instructions (Signed)

## 2014-12-04 ENCOUNTER — Other Ambulatory Visit: Payer: Self-pay | Admitting: *Deleted

## 2014-12-04 MED ORDER — CLOPIDOGREL BISULFATE 75 MG PO TABS: 75.0000 mg | ORAL_TABLET | Freq: Every day | ORAL | Status: DC

## 2014-12-16 ENCOUNTER — Ambulatory Visit (INDEPENDENT_AMBULATORY_CARE_PROVIDER_SITE_OTHER): Payer: Medicare Other | Admitting: Internal Medicine

## 2014-12-22 ENCOUNTER — Ambulatory Visit (INDEPENDENT_AMBULATORY_CARE_PROVIDER_SITE_OTHER): Payer: Medicare Other | Admitting: Internal Medicine

## 2014-12-31 ENCOUNTER — Other Ambulatory Visit (INDEPENDENT_AMBULATORY_CARE_PROVIDER_SITE_OTHER): Payer: Self-pay | Admitting: Internal Medicine

## 2015-01-01 ENCOUNTER — Other Ambulatory Visit: Payer: Self-pay | Admitting: Neurosurgery

## 2015-01-01 DIAGNOSIS — M5412 Radiculopathy, cervical region: Secondary | ICD-10-CM

## 2015-01-09 ENCOUNTER — Other Ambulatory Visit (INDEPENDENT_AMBULATORY_CARE_PROVIDER_SITE_OTHER): Payer: Self-pay | Admitting: Internal Medicine

## 2015-01-09 ENCOUNTER — Ambulatory Visit (INDEPENDENT_AMBULATORY_CARE_PROVIDER_SITE_OTHER): Payer: Medicare Other | Admitting: Cardiovascular Disease

## 2015-01-09 ENCOUNTER — Encounter: Payer: Self-pay | Admitting: Cardiovascular Disease

## 2015-01-09 VITALS — BP 100/80 | HR 76 | Ht 63.0 in | Wt 130.0 lb

## 2015-01-09 DIAGNOSIS — I714 Abdominal aortic aneurysm, without rupture, unspecified: Secondary | ICD-10-CM

## 2015-01-09 DIAGNOSIS — I5032 Chronic diastolic (congestive) heart failure: Secondary | ICD-10-CM | POA: Diagnosis not present

## 2015-01-09 DIAGNOSIS — Z955 Presence of coronary angioplasty implant and graft: Secondary | ICD-10-CM | POA: Diagnosis not present

## 2015-01-09 DIAGNOSIS — I2510 Atherosclerotic heart disease of native coronary artery without angina pectoris: Secondary | ICD-10-CM

## 2015-01-09 DIAGNOSIS — I10 Essential (primary) hypertension: Secondary | ICD-10-CM

## 2015-01-09 MED ORDER — CLOPIDOGREL BISULFATE 75 MG PO TABS: 75.0000 mg | ORAL_TABLET | Freq: Every day | ORAL | Status: DC

## 2015-01-09 MED ORDER — VALSARTAN 160 MG PO TABS: 160.0000 mg | ORAL_TABLET | Freq: Every day | ORAL | Status: DC | PRN

## 2015-01-09 NOTE — Patient Instructions (Signed)

## 2015-01-09 NOTE — Progress Notes (Signed)
Patient ID: [name] [name], female   DOB: 12-10-1950, 64 y.o.   MRN: [id number]      SUBJECTIVE: The patient presents for routine follow-up for coronary artery disease. She denies exertional chest pain and shortness of breath. She has seldom experienced dizziness. Blood pressure has been well controlled. Her primary problems relate to cervical spine neck pain. Seen in ED for this in July.   CT of the abdomen on 10/27/14 showed abdominal aortic aneurysm to be 36 mm in diameter.  Had an episode of confusion and proximally one month ago where she forgot her husband's name. Denies dysarthria and leg or arm weakness.   Review of Systems: As per "subjective", otherwise negative.  Allergies  Allergen Reactions  . Percocet [Oxycodone-Acetaminophen] Itching    Must take med with hydroxyzine  . Amlodipine Besylate Hives  . Doxazosin Mesylate Hives  . Erythromycin Hives  . Lisinopril Hives  . Metoprolol Tartrate Hives  . Oxycodone Hives and Itching    Benadryl is needed with dose  . Penicillins Hives    Current Outpatient Prescriptions  Medication Sig Dispense Refill  . aspirin EC 81 MG tablet Take 1 tablet (81 mg total) by mouth daily. (Patient taking differently: Take 81 mg by mouth every evening. )    . carvedilol (COREG) 6.25 MG tablet Take 1 tablet (6.25 mg total) by mouth 2 (two) times daily. 180 tablet 3  . cetirizine (ZYRTEC) 10 MG chewable tablet Chew 10 mg by mouth daily.    . clopidogrel (PLAVIX) 75 MG tablet Take 1 tablet (75 mg total) by mouth daily. 30 tablet 0  . Cyanocobalamin (VITAMIN B 12 PO) Take 1,000 mcg by mouth daily.    . diazepam (VALIUM) 5 MG tablet Take 5-10 mg by mouth 2 (two) times daily. Takes 1 tab in am and 2 tablets at night    . dicyclomine (BENTYL) 20 MG tablet Take 1 tablet (20 mg total) by mouth 4 (four) times daily - after meals and at bedtime. (Patient taking differently: Take 20 mg by mouth as needed for spasms. ) 120 tablet 4  . diphenhydrAMINE  (BENADRYL) 25 MG tablet Take 25 mg by mouth daily.    . diphenoxylate-atropine (LOMOTIL) 2.5-0.025 MG per tablet TAKE ONE TABLET BY MOUTH 4 TIMES DAILY AS NEEDED FOR DIARRHEA OR LOOSE STOOLS 120 tablet 2  . famotidine (PEPCID) 20 MG tablet Take 1 tablet (20 mg total) by mouth 2 (two) times daily. 60 tablet 5  . furosemide (LASIX) 40 MG tablet Take 1 tablet (40 mg total) by mouth daily as needed (swelling). 30 tablet 6  . gabapentin (NEURONTIN) 600 MG tablet Take 600 mg by mouth 2 (two) times daily.     . hydrOXYzine (VISTARIL) 25 MG capsule Take 25 mg by mouth 2 (two) times daily as needed. For itching    . nitroGLYCERIN (NITROSTAT) 0.4 MG SL tablet Place 1 tablet (0.4 mg total) under the tongue every 5 (five) minutes as needed for chest pain. 25 tablet 3  . oxyCODONE-acetaminophen (PERCOCET/ROXICET) 5-325 MG per tablet Take 1 tablet by mouth every 6 (six) hours as needed. For pain    . pantoprazole (PROTONIX) 40 MG tablet TAKE ONE TABLET BY MOUTH IN THE MORNING BEFORE BREAKFAST 30 tablet 5  . pravastatin (PRAVACHOL) 40 MG tablet Take 40 mg by mouth at bedtime.     . predniSONE (DELTASONE) 20 MG tablet 3 tabs po day one, then 2 po daily x 4 days 11 tablet 0  .  Probiotic Product (PROBIOTIC DAILY PO) Take 1 capsule by mouth daily.     . promethazine (PHENERGAN) 25 MG tablet TAKE ONE TABLET BY MOUTH EVERY 6 HOURS AS NEEDED FOR NAUSEA 30 tablet 1  . triamcinolone (NASACORT ALLERGY 24HR) 55 MCG/ACT AERO nasal inhaler Place 2 sprays into the nose daily as needed (for allergies).    . valsartan (DIOVAN) 160 MG tablet Take 1 tablet (160 mg total) by mouth daily as needed. *only take if systolic level exceeds [id number] 30 tablet 2  . venlafaxine XR (EFFEXOR-XR) 150 MG 24 hr capsule Take 150 mg by mouth at bedtime.    . vitamin E (VITAMIN E) 400 UNIT capsule Take 400 Units by mouth daily.     . [DISCONTINUED] fluticasone (FLONASE) 50 MCG/ACT nasal spray Place 2 sprays into the nose as needed for allergies.       No current facility-administered medications for this visit.    Past Medical History  Diagnosis Date  . GERD (gastroesophageal reflux disease)   . Abdominal cramping 09/21/2010  . Chronic diarrhea   . Coronary artery disease     Status post drug eluding stent to the right coronary artery status post in-stent restenosis.  DAPT  . AAA (abdominal aortic aneurysm)   . Cancer 2010    right breast   mastectomy and cheotherapy  . IBS (irritable bowel syndrome)   . Diverticulosis   . Aortic aneurysm, abdominal   . Hypertension   . Arthritis   . Anxiety   . Depression   . Hyperlipidemia     Past Surgical History  Procedure Laterality Date  . Mastectomy  05/27/2008    RIGHT  . Small bowel givens  01/06/2009  . Ct angiography  11/02/2010    ABD &PELV  . Appendectomy  1995  . Colonoscopy    . Cervical fusion      x6  . Cataract extraction w/phaco Left 07/19/2012    Procedure: CATARACT EXTRACTION PHACO AND INTRAOCULAR LENS PLACEMENT (IOC);  Surgeon: Tonny Branch, MD;  Location: [facility];  Service: Ophthalmology;  Laterality: Left;  CDE:9.95  . Coronary angioplasty with stent placement  2000-2010    3 cardiac stent splaced per patient  . Esophagogastroduodenoscopy (egd) with propofol N/A 05/07/2014    Procedure: ESOPHAGOGASTRODUODENOSCOPY (EGD) WITH PROPOFOL;  Surgeon: Rogene Houston, MD;  Location: [facility];  Service: Endoscopy;  Laterality: N/A;  Venia Minks dilation N/A 05/07/2014    Procedure: MALONEY DILATION 56 cm;  Surgeon: Rogene Houston, MD;  Location: [facility];  Service: Endoscopy;  Laterality: N/A;  . Esophageal biopsy N/A 05/07/2014    Procedure: BIOPSY;  Surgeon: Rogene Houston, MD;  Location: [facility];  Service: Endoscopy;  Laterality: N/A;    Social History   Social History  . Marital Status: Married    Spouse Name: N/A  . Number of Children: N/A  . Years of Education: N/A   Occupational History  . Not on file.   Social History Main Topics  . Smoking status:  Current Every Day Smoker -- 1.00 packs/day for 45 years    Types: Cigarettes    Start date: 06/04/1965  . Smokeless tobacco: Never Used     Comment: using e cigs, but she does smoke  . Alcohol Use: No  . Drug Use: No  . Sexual Activity: Not on file   Other Topics Concern  . Not on file   Social History Narrative     Filed Vitals:   01/09/15 1307  BP: 100/80  Pulse: 76  Height: 5\' 3"  (1.6 m)  Weight: 130 lb (58.968 kg)  SpO2: 95%    PHYSICAL EXAM General: NAD HEENT: Normal. Neck: No JVD, no thyromegaly. Lungs: Clear to auscultation bilaterally with normal respiratory effort. CV: Nondisplaced PMI.  Regular rate and rhythm, normal S1/S2, no S3/S4, no murmur. No pretibial or periankle edema.  No carotid bruit.    Abdomen: Soft, no distention.  Neurologic: Alert and oriented x 3.  Psych: Normal affect. Skin: Normal. Musculoskeletal: No gross deformities. Extremities: No clubbing or cyanosis.   ECG: Most recent ECG reviewed.      ASSESSMENT AND PLAN: 1. CAD with prior stenting of LAD and RCA with mild instent restenosis in 01/2013: Stable ischemic heart disease. Continue aspirin 81 mg daily, Plavix 75 mg daily (will refill), and pravastatin 40 mg daily.  2. Essential HTN: Well controlled today on Coreg 6.25 mg bid and valsartan 160 mg prn (will refill). No changes.  3. Hyperlipidemia: Obtain copy of lipids from PCP. Continue pravastatin 40 mg daily.  4. AAA: Followed by [facility]. 36 mm by CT in June 2016.  Dispo: f/u 1 year.   Kate Sable, M.D., F.A.C.C.

## 2015-01-21 ENCOUNTER — Ambulatory Visit (INDEPENDENT_AMBULATORY_CARE_PROVIDER_SITE_OTHER): Payer: Medicare Other | Admitting: Internal Medicine

## 2015-02-02 ENCOUNTER — Ambulatory Visit (INDEPENDENT_AMBULATORY_CARE_PROVIDER_SITE_OTHER): Payer: Medicare Other | Admitting: Internal Medicine

## 2015-02-24 ENCOUNTER — Ambulatory Visit (INDEPENDENT_AMBULATORY_CARE_PROVIDER_SITE_OTHER): Payer: Medicare Other | Admitting: Internal Medicine

## 2015-02-24 ENCOUNTER — Other Ambulatory Visit (INDEPENDENT_AMBULATORY_CARE_PROVIDER_SITE_OTHER): Payer: Self-pay | Admitting: Internal Medicine

## 2015-02-24 ENCOUNTER — Encounter (INDEPENDENT_AMBULATORY_CARE_PROVIDER_SITE_OTHER): Payer: Self-pay | Admitting: Internal Medicine

## 2015-02-24 VITALS — BP 154/100 | HR 84 | Temp 98.7°F | Ht 63.0 in | Wt 125.1 lb

## 2015-02-24 DIAGNOSIS — K589 Irritable bowel syndrome without diarrhea: Secondary | ICD-10-CM

## 2015-02-24 DIAGNOSIS — I2510 Atherosclerotic heart disease of native coronary artery without angina pectoris: Secondary | ICD-10-CM | POA: Diagnosis not present

## 2015-02-24 MED ORDER — DIPHENOXYLATE-ATROPINE 2.5-0.025 MG PO TABS: ORAL_TABLET | ORAL | Status: DC

## 2015-02-24 NOTE — Patient Instructions (Signed)
OV in 1 year.  

## 2015-02-24 NOTE — Progress Notes (Signed)
Subjective:    Patient ID: [name] [name], female    DOB: 02/02/1951, 64 y.o.   MRN: [id number]  HPI  64 yr old white female presenting today requesting colonoscopy. Her last colonoscopy was in August of 2010 for anemia which revealed a f ew scattered diverticula at sigmoid and descending colon, normal exam.   Her appetite is good. She has lost 3 pounds in the last 6 months. She does have abdominal cramps and takes Dicyclomine daily. She also takes IBGARD which helps. She usually has a BM daily. She alternates between diarrhea and constipation. She says she saw blood when she wiped and she says it was her hemorrhoid. Her last colonoscopy was in 2010 which revealed a few scattered diverticula at sigmoid and descending colon, otherwise normal exam. Hx of anemia. Last CBC in June was normal.   Hx of iron deficiency anemia.  Hx significant for CAD and cardiac stent and on Plavix and ASA CBC    Component Value Date/Time   WBC 8.5 10/27/2014 1122   RBC 5.00 10/27/2014 1122   HGB 15.4* 10/27/2014 1122   HCT 45.1 10/27/2014 1122   PLT 223 10/27/2014 1122   MCV 90.2 10/27/2014 1122   MCH 30.8 10/27/2014 1122   MCHC 34.1 10/27/2014 1122   RDW 14.1 10/27/2014 1122   LYMPHSABS 2.3 10/27/2014 1122   MONOABS 0.8 10/27/2014 1122   EOSABS 0.1 10/27/2014 1122   BASOSABS 0.0 10/27/2014 1122      Review of Systems Past Medical History  Diagnosis Date  . GERD (gastroesophageal reflux disease)   . Abdominal cramping 09/21/2010  . Chronic diarrhea   . Coronary artery disease     Status post drug eluding stent to the right coronary artery status post in-stent restenosis.  DAPT  . AAA (abdominal aortic aneurysm) ([place])   . Cancer [facility]) 2010    right breast   mastectomy and cheotherapy  . IBS (irritable bowel syndrome)   . Diverticulosis   . Aortic aneurysm, abdominal ([place])   . Hypertension   . Arthritis   . Anxiety   . Depression   . Hyperlipidemia     Past Surgical History  Procedure  Laterality Date  . Mastectomy  05/27/2008    RIGHT  . Small bowel givens  01/06/2009  . Ct angiography  11/02/2010    ABD &PELV  . Appendectomy  1995  . Colonoscopy    . Cervical fusion      x6  . Cataract extraction w/phaco Left 07/19/2012    Procedure: CATARACT EXTRACTION PHACO AND INTRAOCULAR LENS PLACEMENT (IOC);  Surgeon: Tonny Branch, MD;  Location: [facility];  Service: Ophthalmology;  Laterality: Left;  CDE:9.95  . Coronary angioplasty with stent placement  2000-2010    3 cardiac stent splaced per patient  . Esophagogastroduodenoscopy (egd) with propofol N/A 05/07/2014    Procedure: ESOPHAGOGASTRODUODENOSCOPY (EGD) WITH PROPOFOL;  Surgeon: Rogene Houston, MD;  Location: [facility];  Service: Endoscopy;  Laterality: N/A;  Venia Minks dilation N/A 05/07/2014    Procedure: MALONEY DILATION 56 cm;  Surgeon: Rogene Houston, MD;  Location: [facility];  Service: Endoscopy;  Laterality: N/A;  . Esophageal biopsy N/A 05/07/2014    Procedure: BIOPSY;  Surgeon: Rogene Houston, MD;  Location: [facility];  Service: Endoscopy;  Laterality: N/A;    Allergies  Allergen Reactions  . Percocet [Oxycodone-Acetaminophen] Itching    Must take med with hydroxyzine  . Amlodipine Besylate Hives  . Doxazosin Mesylate Hives  .  Erythromycin Hives  . Lisinopril Hives  . Metoprolol Tartrate Hives  . Oxycodone Hives and Itching    Benadryl is needed with dose  . Penicillins Hives    Current Outpatient Prescriptions on File Prior to Visit  Medication Sig Dispense Refill  . aspirin EC 81 MG tablet Take 1 tablet (81 mg total) by mouth daily. (Patient taking differently: Take 81 mg by mouth every evening. )    . carvedilol (COREG) 6.25 MG tablet Take 1 tablet (6.25 mg total) by mouth 2 (two) times daily. 180 tablet 3  . cetirizine (ZYRTEC) 10 MG chewable tablet Chew 10 mg by mouth daily.    . clopidogrel (PLAVIX) 75 MG tablet Take 1 tablet (75 mg total) by mouth daily. 90 tablet 3  . Cyanocobalamin (VITAMIN B 12 PO)  Take 1,000 mcg by mouth daily.    . diazepam (VALIUM) 5 MG tablet Take 5-10 mg by mouth 2 (two) times daily. Takes 1 tab in am and 2 tablets at night    . dicyclomine (BENTYL) 20 MG tablet Take 1 tablet (20 mg total) by mouth 4 (four) times daily - after meals and at bedtime. (Patient taking differently: Take 20 mg by mouth as needed for spasms. ) 120 tablet 4  . diphenhydrAMINE (BENADRYL) 25 MG tablet Take 25 mg by mouth daily.    . diphenoxylate-atropine (LOMOTIL) 2.5-0.025 MG per tablet TAKE ONE TABLET BY MOUTH 4 TIMES DAILY AS NEEDED FOR DIARRHEA OR LOOSE STOOLS 120 tablet 2  . famotidine (PEPCID) 20 MG tablet Take 1 tablet (20 mg total) by mouth 2 (two) times daily. 60 tablet 5  . furosemide (LASIX) 40 MG tablet Take 1 tablet (40 mg total) by mouth daily as needed (swelling). 30 tablet 6  . gabapentin (NEURONTIN) 600 MG tablet Take 600 mg by mouth 2 (two) times daily.     . hydrOXYzine (VISTARIL) 25 MG capsule Take 25 mg by mouth 2 (two) times daily as needed. For itching    . nitroGLYCERIN (NITROSTAT) 0.4 MG SL tablet Place 1 tablet (0.4 mg total) under the tongue every 5 (five) minutes as needed for chest pain. 25 tablet 3  . oxyCODONE-acetaminophen (PERCOCET/ROXICET) 5-325 MG per tablet Take 1 tablet by mouth every 6 (six) hours as needed. For pain    . pantoprazole (PROTONIX) 40 MG tablet TAKE ONE TABLET BY MOUTH IN THE MORNING BEFORE BREAKFAST 30 tablet 5  . pravastatin (PRAVACHOL) 40 MG tablet Take 40 mg by mouth at bedtime.     . Probiotic Product (PROBIOTIC DAILY PO) Take 1 capsule by mouth daily.     . promethazine (PHENERGAN) 25 MG tablet TAKE ONE TABLET BY MOUTH EVERY 6 HOURS AS NEEDED FOR NAUSEA 30 tablet 1  . triamcinolone (NASACORT ALLERGY 24HR) 55 MCG/ACT AERO nasal inhaler Place 2 sprays into the nose daily as needed (for allergies).    . valsartan (DIOVAN) 160 MG tablet Take 1 tablet (160 mg total) by mouth daily as needed. *only take if systolic level exceeds [id number] 90 tablet 3    . venlafaxine XR (EFFEXOR-XR) 150 MG 24 hr capsule Take 150 mg by mouth at bedtime.    . vitamin E (VITAMIN E) 400 UNIT capsule Take 400 Units by mouth daily.     . [DISCONTINUED] fluticasone (FLONASE) 50 MCG/ACT nasal spray Place 2 sprays into the nose as needed for allergies.      No current facility-administered medications on file prior to visit.  Objective:   Physical Exam Blood pressure 154/100, pulse 84, temperature 98.7 F (37.1 C), height 5\' 3"  (1.6 m), weight 125 lb 1.6 oz (56.745 kg).  Alert and oriented. Skin warm and dry. Oral mucosa is moist.   . Sclera anicteric, conjunctivae is pink. Thyroid not enlarged. No cervical lymphadenopathy. Lungs clear. Heart regular rate and rhythm.  Abdomen is soft. Bowel sounds are positive. No hepatomegaly. No abdominal masses felt. No tenderness.  No edema to lower extremities.  Stool brown and guaiac negative.   [id number] 2019-11 [id number] 12R 12-18    Assessment & Plan:  Hx of IBS for years. Dicyclomine and IBGARD have helped her symptoms. Next colonoscopy 2020.

## 2015-04-01 ENCOUNTER — Telehealth (INDEPENDENT_AMBULATORY_CARE_PROVIDER_SITE_OTHER): Payer: Self-pay | Admitting: *Deleted

## 2015-04-01 ENCOUNTER — Other Ambulatory Visit (INDEPENDENT_AMBULATORY_CARE_PROVIDER_SITE_OTHER): Payer: Self-pay | Admitting: Internal Medicine

## 2015-04-01 DIAGNOSIS — K589 Irritable bowel syndrome without diarrhea: Secondary | ICD-10-CM

## 2015-04-01 MED ORDER — DIPHENOXYLATE-ATROPINE 2.5-0.025 MG PO TABS: ORAL_TABLET | ORAL | Status: DC

## 2015-04-01 NOTE — Telephone Encounter (Signed)
Rx given to Butch Penny to fax to her pharmacy

## 2015-04-01 NOTE — Telephone Encounter (Signed)
Domita is having a lot of diarrhea for the last several days. She is needing a Rx for Lomotil sent to her pharmacy. If there are any questions please call [phone].

## 2015-04-01 NOTE — Telephone Encounter (Signed)
Rx has been faxed.

## 2015-04-01 NOTE — Telephone Encounter (Signed)
Rx lomotil refilled

## 2015-04-14 ENCOUNTER — Telehealth (INDEPENDENT_AMBULATORY_CARE_PROVIDER_SITE_OTHER): Payer: Self-pay | Admitting: *Deleted

## 2015-04-14 NOTE — Telephone Encounter (Signed)
Patient calls in stating she had some nausea last 4 days.  Was supposed to see Dr. Merlene Laughter and canceled today because of left pain  Advised to see PCP  She agreed.

## 2015-04-16 ENCOUNTER — Telehealth (INDEPENDENT_AMBULATORY_CARE_PROVIDER_SITE_OTHER): Payer: Self-pay | Admitting: *Deleted

## 2015-04-16 NOTE — Telephone Encounter (Signed)
Leaves message that she doesn't know why she isn't put on new IBS medicine for diarrhea.  Can someone call her back  [phone]  Tied of taking Lomotil

## 2015-04-17 NOTE — Telephone Encounter (Signed)
Per Dr.Rehman if the patient has a Gallbladder she should take 100 mg twice a day. If no gallbladder she will need to take 75 mg twice a day. The patient cannot take the lomotil with the Viberzi. Patient was called and made aware, and it was stressed to the patient that she could not take the two together. She will pick up samples Monday.

## 2015-04-17 NOTE — Telephone Encounter (Signed)
Dr.Rehman stated on 04/16/15 that the patient could try the Viberzi. I will check back with him to see which mg the patient needs, then the patient will be notified.

## 2015-04-21 ENCOUNTER — Telehealth (INDEPENDENT_AMBULATORY_CARE_PROVIDER_SITE_OTHER): Payer: Self-pay | Admitting: *Deleted

## 2015-04-21 NOTE — Telephone Encounter (Signed)
Per Dr.Rehman patient may try the Dicyclomine on as needed basis , not a schedule. She may try the Viberzi 100 mg twice a day for a few more days. If she continues to have the cramping , we can try the 75 mg.  Patient was called and made aware.

## 2015-04-21 NOTE — Telephone Encounter (Signed)
Patient states that she started abdominal cramping at 1:30 this morning. No diarrhea. She is asking #1 - cramping is a side effect of this medication , should she stop?                                             #2 - may she take Dicyclomine with the Viberzi?  Patient was advised that theis would be discussed with Dr.Rehman and I will call her back.

## 2015-04-21 NOTE — Telephone Encounter (Signed)
Started new medication for diarrhea yesterday .  She has questions  Does she still take this even if she is having cramps.  [phone] or [phone]

## 2015-04-29 ENCOUNTER — Other Ambulatory Visit: Payer: Self-pay | Admitting: *Deleted

## 2015-04-29 ENCOUNTER — Encounter: Payer: Self-pay | Admitting: Family

## 2015-04-29 DIAGNOSIS — I714 Abdominal aortic aneurysm, without rupture, unspecified: Secondary | ICD-10-CM

## 2015-04-30 ENCOUNTER — Telehealth (INDEPENDENT_AMBULATORY_CARE_PROVIDER_SITE_OTHER): Payer: Self-pay | Admitting: *Deleted

## 2015-04-30 NOTE — Telephone Encounter (Signed)
This will be shared with Dr.Rehman , then the patient will be contacted.

## 2015-04-30 NOTE — Telephone Encounter (Signed)
Viberzi has helped diarrhea but cramping has gotten worse -- please advise

## 2015-05-01 NOTE — Telephone Encounter (Signed)
Talked with the patient . She states that she had already stopped the Viberzi. Cramping has stopped.  Patient states that she will call us with a update.

## 2015-05-05 ENCOUNTER — Ambulatory Visit: Payer: Medicare Other | Admitting: Family

## 2015-05-05 ENCOUNTER — Other Ambulatory Visit (HOSPITAL_COMMUNITY): Payer: Medicare Other

## 2015-05-06 ENCOUNTER — Encounter: Payer: Self-pay | Admitting: Family

## 2015-05-14 ENCOUNTER — Ambulatory Visit: Payer: Medicare Other | Admitting: Family

## 2015-05-14 ENCOUNTER — Other Ambulatory Visit (HOSPITAL_COMMUNITY): Payer: Medicare Other

## 2015-05-22 ENCOUNTER — Other Ambulatory Visit (INDEPENDENT_AMBULATORY_CARE_PROVIDER_SITE_OTHER): Payer: Self-pay | Admitting: Internal Medicine

## 2015-06-23 ENCOUNTER — Other Ambulatory Visit (INDEPENDENT_AMBULATORY_CARE_PROVIDER_SITE_OTHER): Payer: Self-pay | Admitting: Internal Medicine

## 2015-06-23 NOTE — Telephone Encounter (Signed)
Pt called and left a message saying she called [facility] in [place] today and was told they don't have the refill for her Lomotil. She wants to make sure the Rx is sent due to being completely out of the medication. Please call the pt if needed.

## 2015-06-24 ENCOUNTER — Telehealth (INDEPENDENT_AMBULATORY_CARE_PROVIDER_SITE_OTHER): Payer: Self-pay | Admitting: Internal Medicine

## 2015-06-24 DIAGNOSIS — R197 Diarrhea, unspecified: Secondary | ICD-10-CM

## 2015-06-24 MED ORDER — DIPHENOXYLATE-ATROPINE 2.5-0.025 MG PO TABS: ORAL_TABLET | ORAL | Status: DC

## 2015-06-24 NOTE — Telephone Encounter (Signed)
Will have tamm fax on 06/25/2015

## 2015-06-25 ENCOUNTER — Encounter: Payer: Self-pay | Admitting: Family

## 2015-06-30 ENCOUNTER — Telehealth (INDEPENDENT_AMBULATORY_CARE_PROVIDER_SITE_OTHER): Payer: Self-pay | Admitting: Internal Medicine

## 2015-06-30 NOTE — Telephone Encounter (Signed)
Ms. Meins' relative called on her behalf asking if the Rx for Lomotil has been sent to her pharmacy. Per Karna Christmas, the Rx has already been filled. I suggested to the relative that they contact the pharmacy.  Thank you.

## 2015-07-01 ENCOUNTER — Ambulatory Visit: Payer: Medicare Other | Admitting: Family

## 2015-07-01 ENCOUNTER — Inpatient Hospital Stay (HOSPITAL_COMMUNITY): Admission: RE | Admit: 2015-07-01 | Payer: Medicare Other | Source: Ambulatory Visit

## 2015-07-11 DIAGNOSIS — Z23 Encounter for immunization: Secondary | ICD-10-CM | POA: Diagnosis not present

## 2015-07-13 DIAGNOSIS — M5136 Other intervertebral disc degeneration, lumbar region: Secondary | ICD-10-CM | POA: Diagnosis not present

## 2015-07-13 DIAGNOSIS — M5412 Radiculopathy, cervical region: Secondary | ICD-10-CM | POA: Diagnosis not present

## 2015-07-13 DIAGNOSIS — M47812 Spondylosis without myelopathy or radiculopathy, cervical region: Secondary | ICD-10-CM | POA: Diagnosis not present

## 2015-07-14 DIAGNOSIS — Z79899 Other long term (current) drug therapy: Secondary | ICD-10-CM | POA: Diagnosis not present

## 2015-07-14 DIAGNOSIS — M542 Cervicalgia: Secondary | ICD-10-CM | POA: Diagnosis not present

## 2015-07-14 DIAGNOSIS — M5412 Radiculopathy, cervical region: Secondary | ICD-10-CM | POA: Diagnosis not present

## 2015-07-14 DIAGNOSIS — K589 Irritable bowel syndrome without diarrhea: Secondary | ICD-10-CM | POA: Diagnosis not present

## 2015-07-14 DIAGNOSIS — I10 Essential (primary) hypertension: Secondary | ICD-10-CM | POA: Diagnosis not present

## 2015-07-14 DIAGNOSIS — K219 Gastro-esophageal reflux disease without esophagitis: Secondary | ICD-10-CM | POA: Diagnosis not present

## 2015-07-14 DIAGNOSIS — M545 Low back pain: Secondary | ICD-10-CM | POA: Diagnosis not present

## 2015-07-14 DIAGNOSIS — I2510 Atherosclerotic heart disease of native coronary artery without angina pectoris: Secondary | ICD-10-CM | POA: Diagnosis not present

## 2015-07-23 DIAGNOSIS — I2510 Atherosclerotic heart disease of native coronary artery without angina pectoris: Secondary | ICD-10-CM | POA: Diagnosis not present

## 2015-07-23 DIAGNOSIS — J449 Chronic obstructive pulmonary disease, unspecified: Secondary | ICD-10-CM | POA: Diagnosis not present

## 2015-07-23 DIAGNOSIS — E7800 Pure hypercholesterolemia, unspecified: Secondary | ICD-10-CM | POA: Diagnosis not present

## 2015-07-23 DIAGNOSIS — I10 Essential (primary) hypertension: Secondary | ICD-10-CM | POA: Diagnosis not present

## 2015-08-14 ENCOUNTER — Encounter: Payer: Self-pay | Admitting: Family

## 2015-08-16 IMAGING — CT CT ABD-PELV W/ CM
2 of 5 series · 16 of 46 positions shown, 18 images · IV contrast (omnipaque)
Comparison: 06/18/2014

CLINICAL DATA: Nausea, vomiting, and diarrhea for 6 days.

EXAM:
CT ABDOMEN AND PELVIS WITH CONTRAST
TECHNIQUE: Multidetector CT imaging of the abdomen and pelvis was performed
using the standard protocol following bolus administration of
intravenous contrast.
CONTRAST:  100mL OMNIPAQUE IOHEXOL 300 MG/ML  SOLN

[Series 2: abd_pel_with 5.0 b40f · axial · 0.64mm/px · z∈[-450,-74]mm · 13 of 85 slices shown, 15 images]
[im 5/85  soft-tissue]
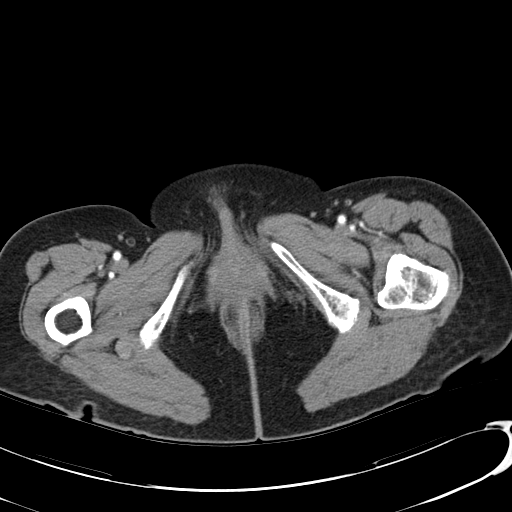
[im 5/85  bone]
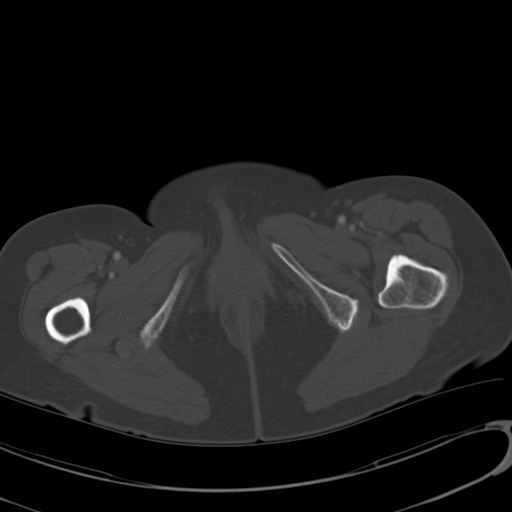
[im 10/85  soft-tissue]
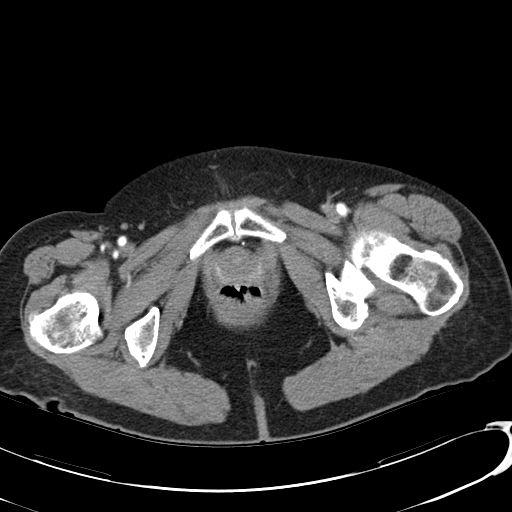
[im 19/85  soft-tissue]
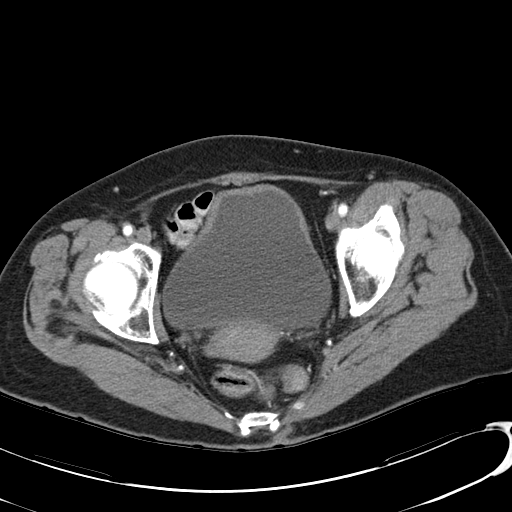
[im 24/85  soft-tissue]
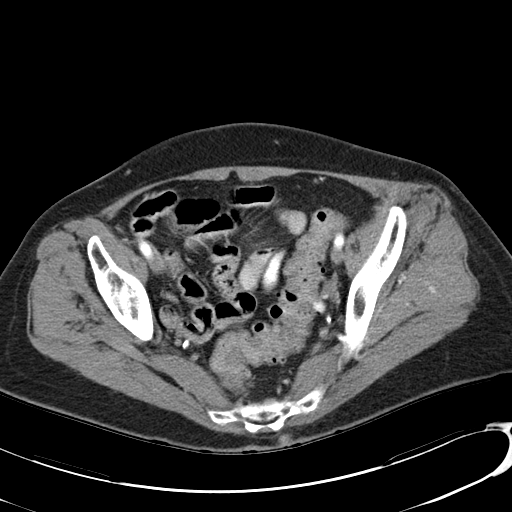
[im 29/85  soft-tissue]
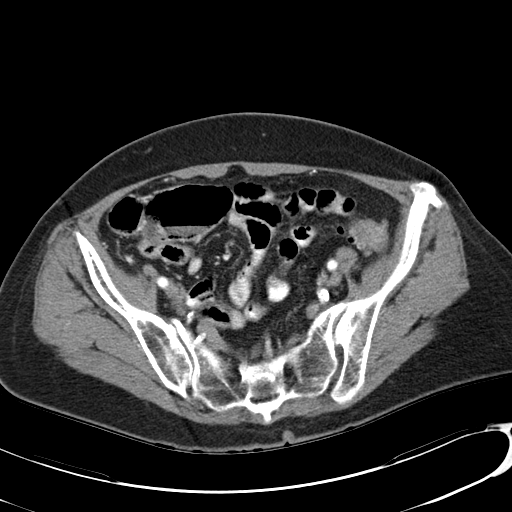
[im 38/85  soft-tissue]
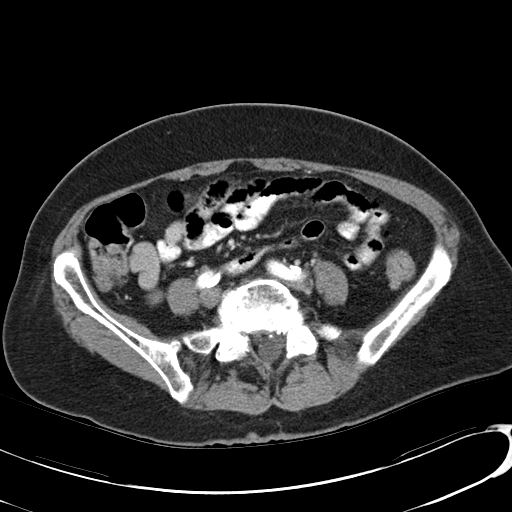
[im 43/85  soft-tissue]
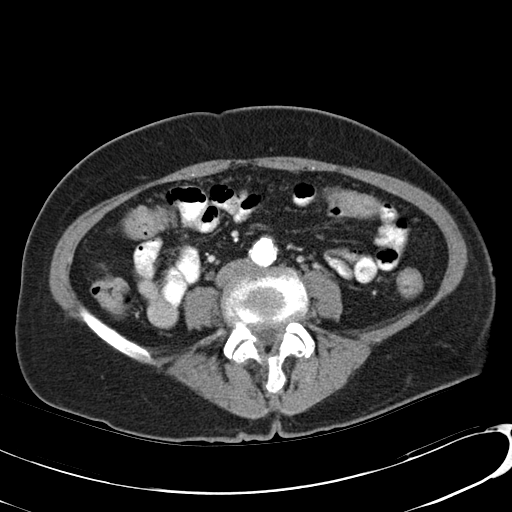
[im 47/85  soft-tissue]
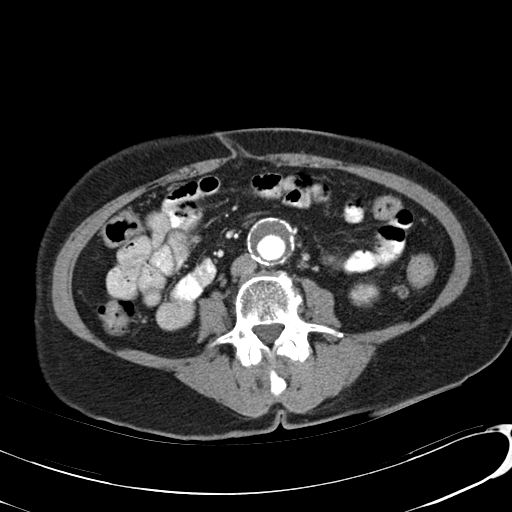
[im 57/85  soft-tissue]
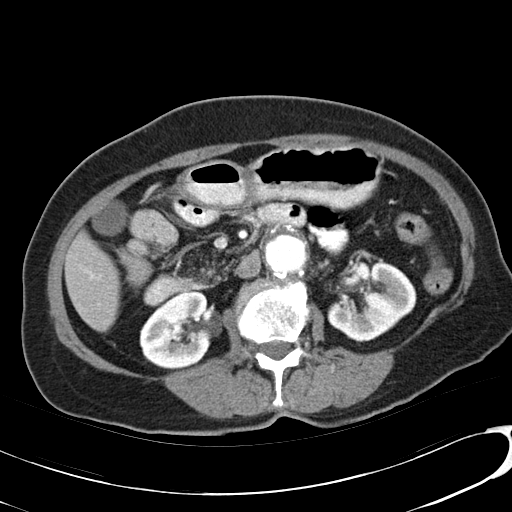
[im 57/85  bone]
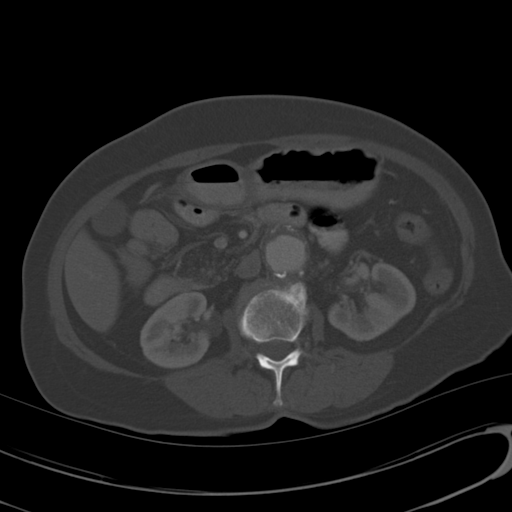
[im 61/85  soft-tissue]
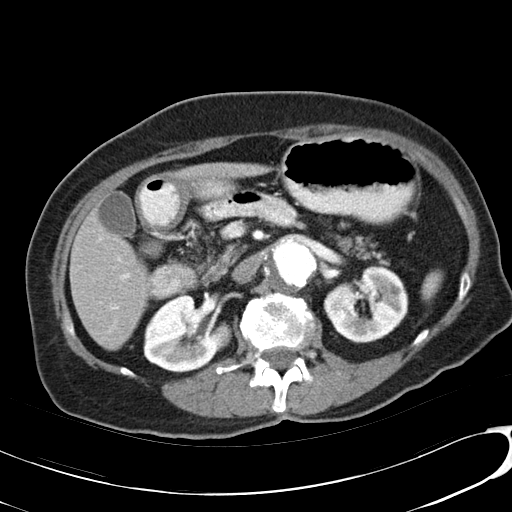
[im 66/85  soft-tissue]
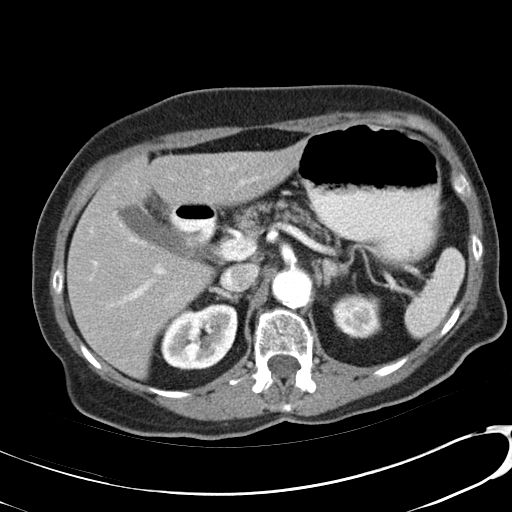
[im 75/85  soft-tissue]
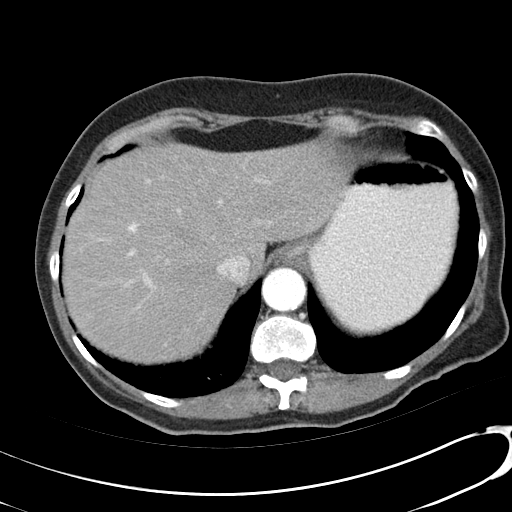
[im 80/85  soft-tissue]
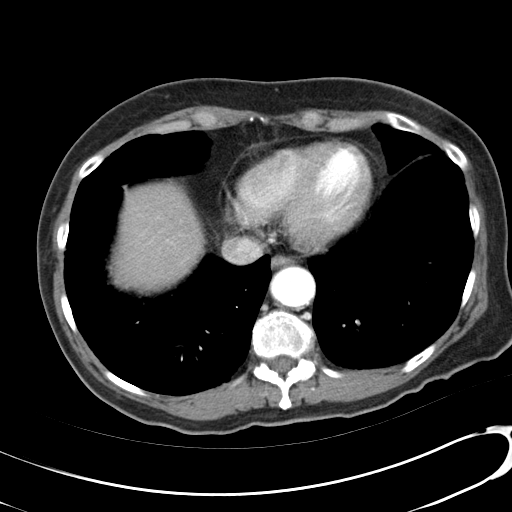

[Series 4: abd_pel_with 3.0 spo · coronal · 0.63mm/px · 3 of 77 slices shown]
[im 26/77  soft-tissue]
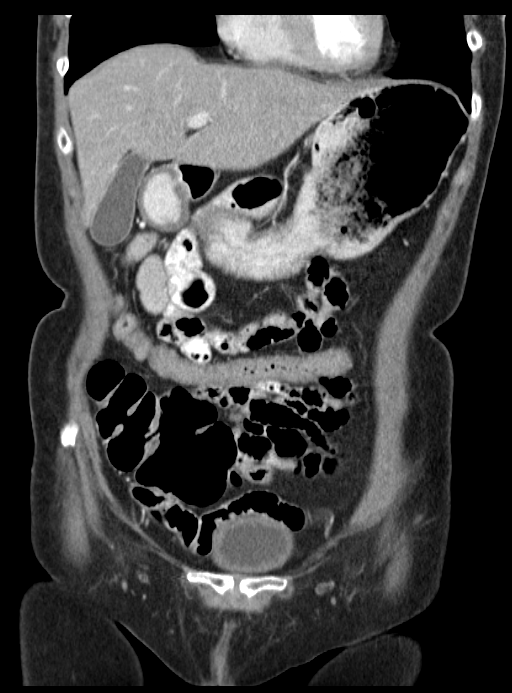
[im 34/77  soft-tissue]
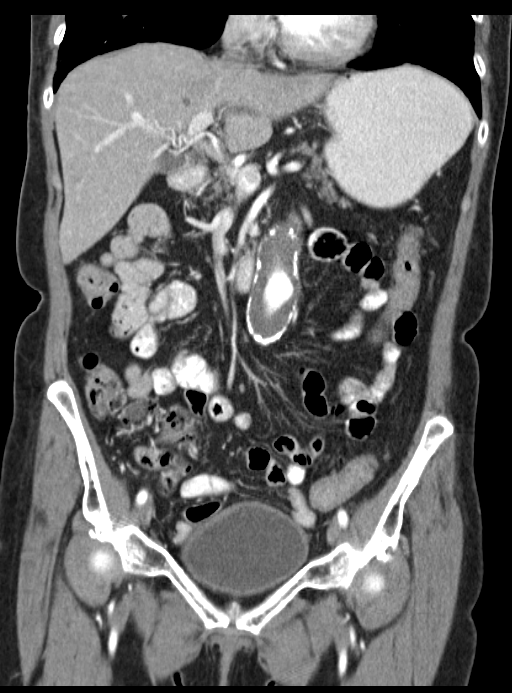
[im 43/77  soft-tissue]
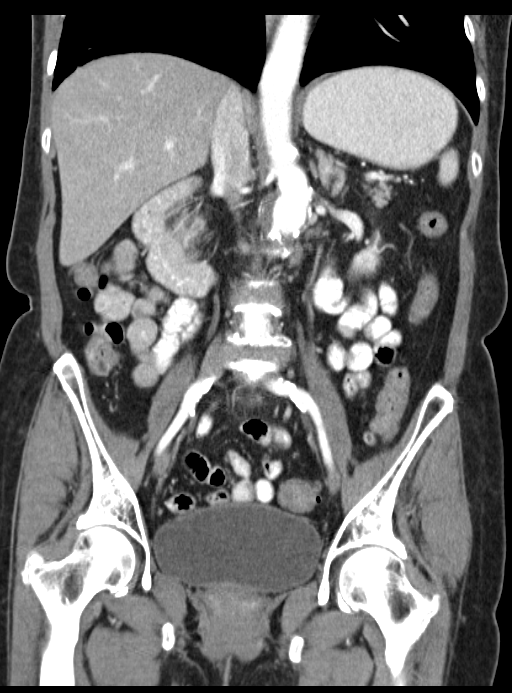

[16 of 46 positions shown; findings below may reference images not displayed]

FINDINGS: BODY WALL: No contributory findings.

LOWER CHEST: No pneumonia or effusion.

ABDOMEN/PELVIS:

Liver: No focal abnormality.

Biliary: No evidence of biliary obstruction or stone.

Pancreas: Fatty atrophy.  No inflammatory change or duct enlargement

Spleen: Unremarkable.

Adrenals: Unremarkable.

Kidneys and ureters: No hydronephrosis or stone. Cm or smaller
presumed cysts in the bilateral renal cortex.

Bladder: Unremarkable.

Reproductive: No pathologic findings.

Bowel: Diverticulosis of the distal colon without active
inflammation. Apparent colonic wall thickening is likely from
underdistention. Appendectomy.

Retroperitoneum: No mass or adenopathy.

Peritoneum: No ascites or pneumoperitoneum.

Vascular: Diffuse aneurysmal enlargement of the abdominal aorta,
beginning just below the renal arteries and continuing to the
bifurcation. Maximal diameter is stable from previous at 36 x 34 mm.
No evidence of leakage or inflammation. No evidence of major vessel
occlusion.

OSSEOUS: No acute abnormalities.
IMPRESSION: 1. No acute findings.
2. Colonic diverticulosis.
3. 36 mm abdominal aortic aneurysm. Recommend followup by ultrasound
in 2 years. This recommendation follows ACR consensus guidelines:
White Paper of the ACR Incidental Findings Committee II on Vascular
Findings. [HOSPITAL] 8461; [DATE].

## 2015-08-17 ENCOUNTER — Other Ambulatory Visit (INDEPENDENT_AMBULATORY_CARE_PROVIDER_SITE_OTHER): Payer: Self-pay | Admitting: Internal Medicine

## 2015-08-18 ENCOUNTER — Telehealth (INDEPENDENT_AMBULATORY_CARE_PROVIDER_SITE_OTHER): Payer: Self-pay | Admitting: Internal Medicine

## 2015-08-18 DIAGNOSIS — K589 Irritable bowel syndrome without diarrhea: Secondary | ICD-10-CM

## 2015-08-18 MED ORDER — DIPHENOXYLATE-ATROPINE 2.5-0.025 MG PO TABS: ORAL_TABLET | ORAL | Status: DC

## 2015-08-18 NOTE — Telephone Encounter (Signed)
error 

## 2015-08-20 ENCOUNTER — Ambulatory Visit (HOSPITAL_COMMUNITY)
Admission: RE | Admit: 2015-08-20 | Discharge: 2015-08-20 | Disposition: A | Discharge status: Home / Self Care | Payer: Medicare Other | Source: Ambulatory Visit | Attending: Family | Admitting: Family

## 2015-08-20 ENCOUNTER — Ambulatory Visit: Payer: Medicare Other | Admitting: Family

## 2015-08-20 DIAGNOSIS — I714 Abdominal aortic aneurysm, without rupture, unspecified: Secondary | ICD-10-CM

## 2015-09-03 DIAGNOSIS — I2510 Atherosclerotic heart disease of native coronary artery without angina pectoris: Secondary | ICD-10-CM | POA: Diagnosis not present

## 2015-09-03 DIAGNOSIS — I10 Essential (primary) hypertension: Secondary | ICD-10-CM | POA: Diagnosis not present

## 2015-09-03 DIAGNOSIS — J449 Chronic obstructive pulmonary disease, unspecified: Secondary | ICD-10-CM | POA: Diagnosis not present

## 2015-09-03 DIAGNOSIS — E7800 Pure hypercholesterolemia, unspecified: Secondary | ICD-10-CM | POA: Diagnosis not present

## 2015-09-14 DIAGNOSIS — K589 Irritable bowel syndrome without diarrhea: Secondary | ICD-10-CM | POA: Diagnosis not present

## 2015-09-14 DIAGNOSIS — M542 Cervicalgia: Secondary | ICD-10-CM | POA: Diagnosis not present

## 2015-09-14 DIAGNOSIS — I714 Abdominal aortic aneurysm, without rupture: Secondary | ICD-10-CM | POA: Diagnosis not present

## 2015-09-14 DIAGNOSIS — M5412 Radiculopathy, cervical region: Secondary | ICD-10-CM | POA: Diagnosis not present

## 2015-09-14 DIAGNOSIS — I2510 Atherosclerotic heart disease of native coronary artery without angina pectoris: Secondary | ICD-10-CM | POA: Diagnosis not present

## 2015-09-14 DIAGNOSIS — I10 Essential (primary) hypertension: Secondary | ICD-10-CM | POA: Diagnosis not present

## 2015-09-14 DIAGNOSIS — Z79899 Other long term (current) drug therapy: Secondary | ICD-10-CM | POA: Diagnosis not present

## 2015-09-14 DIAGNOSIS — M545 Low back pain: Secondary | ICD-10-CM | POA: Diagnosis not present

## 2015-09-14 DIAGNOSIS — K219 Gastro-esophageal reflux disease without esophagitis: Secondary | ICD-10-CM | POA: Diagnosis not present

## 2015-09-22 ENCOUNTER — Telehealth (INDEPENDENT_AMBULATORY_CARE_PROVIDER_SITE_OTHER): Payer: Self-pay | Admitting: Internal Medicine

## 2015-09-22 NOTE — Telephone Encounter (Signed)
Patient called, left message, stated that she is absolute need of Lomotil.  She'd like a prescription.  [phone]

## 2015-09-23 NOTE — Telephone Encounter (Signed)
Patient called again checking to see if she can get a prescription for Lomotil.  [phone]

## 2015-09-24 DIAGNOSIS — J449 Chronic obstructive pulmonary disease, unspecified: Secondary | ICD-10-CM | POA: Diagnosis not present

## 2015-09-24 DIAGNOSIS — E7800 Pure hypercholesterolemia, unspecified: Secondary | ICD-10-CM | POA: Diagnosis not present

## 2015-09-24 DIAGNOSIS — I2510 Atherosclerotic heart disease of native coronary artery without angina pectoris: Secondary | ICD-10-CM | POA: Diagnosis not present

## 2015-09-24 DIAGNOSIS — I10 Essential (primary) hypertension: Secondary | ICD-10-CM | POA: Diagnosis not present

## 2015-11-02 DIAGNOSIS — J449 Chronic obstructive pulmonary disease, unspecified: Secondary | ICD-10-CM | POA: Diagnosis not present

## 2015-11-02 DIAGNOSIS — I2510 Atherosclerotic heart disease of native coronary artery without angina pectoris: Secondary | ICD-10-CM | POA: Diagnosis not present

## 2015-11-02 DIAGNOSIS — E7800 Pure hypercholesterolemia, unspecified: Secondary | ICD-10-CM | POA: Diagnosis not present

## 2015-11-02 DIAGNOSIS — I10 Essential (primary) hypertension: Secondary | ICD-10-CM | POA: Diagnosis not present

## 2015-11-03 DIAGNOSIS — Z79899 Other long term (current) drug therapy: Secondary | ICD-10-CM | POA: Diagnosis not present

## 2015-11-03 DIAGNOSIS — K219 Gastro-esophageal reflux disease without esophagitis: Secondary | ICD-10-CM | POA: Diagnosis not present

## 2015-11-03 DIAGNOSIS — K589 Irritable bowel syndrome without diarrhea: Secondary | ICD-10-CM | POA: Diagnosis not present

## 2015-11-03 DIAGNOSIS — I10 Essential (primary) hypertension: Secondary | ICD-10-CM | POA: Diagnosis not present

## 2015-11-03 DIAGNOSIS — I2510 Atherosclerotic heart disease of native coronary artery without angina pectoris: Secondary | ICD-10-CM | POA: Diagnosis not present

## 2015-11-03 DIAGNOSIS — M545 Low back pain: Secondary | ICD-10-CM | POA: Diagnosis not present

## 2015-11-03 DIAGNOSIS — M542 Cervicalgia: Secondary | ICD-10-CM | POA: Diagnosis not present

## 2015-11-03 DIAGNOSIS — M5412 Radiculopathy, cervical region: Secondary | ICD-10-CM | POA: Diagnosis not present

## 2015-11-19 DIAGNOSIS — Z1231 Encounter for screening mammogram for malignant neoplasm of breast: Secondary | ICD-10-CM | POA: Diagnosis not present

## 2015-11-19 DIAGNOSIS — Z9011 Acquired absence of right breast and nipple: Secondary | ICD-10-CM | POA: Diagnosis not present

## 2015-11-25 ENCOUNTER — Encounter (INDEPENDENT_AMBULATORY_CARE_PROVIDER_SITE_OTHER): Payer: Self-pay | Admitting: Internal Medicine

## 2015-12-01 DIAGNOSIS — R51 Headache: Secondary | ICD-10-CM | POA: Diagnosis not present

## 2015-12-01 DIAGNOSIS — R110 Nausea: Secondary | ICD-10-CM | POA: Diagnosis not present

## 2015-12-01 DIAGNOSIS — C50911 Malignant neoplasm of unspecified site of right female breast: Secondary | ICD-10-CM | POA: Diagnosis not present

## 2015-12-01 DIAGNOSIS — M542 Cervicalgia: Secondary | ICD-10-CM | POA: Diagnosis not present

## 2015-12-02 DIAGNOSIS — Z79899 Other long term (current) drug therapy: Secondary | ICD-10-CM | POA: Diagnosis not present

## 2015-12-02 DIAGNOSIS — K219 Gastro-esophageal reflux disease without esophagitis: Secondary | ICD-10-CM | POA: Diagnosis not present

## 2015-12-02 DIAGNOSIS — I10 Essential (primary) hypertension: Secondary | ICD-10-CM | POA: Diagnosis not present

## 2015-12-02 DIAGNOSIS — M542 Cervicalgia: Secondary | ICD-10-CM | POA: Diagnosis not present

## 2015-12-02 DIAGNOSIS — I2510 Atherosclerotic heart disease of native coronary artery without angina pectoris: Secondary | ICD-10-CM | POA: Diagnosis not present

## 2015-12-02 DIAGNOSIS — M5412 Radiculopathy, cervical region: Secondary | ICD-10-CM | POA: Diagnosis not present

## 2015-12-02 DIAGNOSIS — Z79891 Long term (current) use of opiate analgesic: Secondary | ICD-10-CM | POA: Diagnosis not present

## 2015-12-02 DIAGNOSIS — M545 Low back pain: Secondary | ICD-10-CM | POA: Diagnosis not present

## 2015-12-02 DIAGNOSIS — K589 Irritable bowel syndrome without diarrhea: Secondary | ICD-10-CM | POA: Diagnosis not present

## 2015-12-03 ENCOUNTER — Ambulatory Visit: Payer: Medicare Other | Admitting: Cardiovascular Disease

## 2015-12-04 ENCOUNTER — Ambulatory Visit (INDEPENDENT_AMBULATORY_CARE_PROVIDER_SITE_OTHER): Payer: Medicare Other | Admitting: Cardiovascular Disease

## 2015-12-04 ENCOUNTER — Ambulatory Visit: Payer: Medicare Other | Admitting: Cardiovascular Disease

## 2015-12-04 DIAGNOSIS — R0989 Other specified symptoms and signs involving the circulatory and respiratory systems: Secondary | ICD-10-CM

## 2015-12-08 ENCOUNTER — Telehealth: Payer: Self-pay | Admitting: *Deleted

## 2015-12-08 NOTE — Telephone Encounter (Signed)
F/u - pt hasn't reported to ED. Says her husband will take her after she showers.

## 2015-12-08 NOTE — Telephone Encounter (Signed)
Pt c/o BP 83/66 HR 85 dizziness, SOB, off and on chest pain since this morning. Pt hasn't been seen since 12/2014 says she couldn't make last appt on Friday because of headaches. Suggested pt report to [facility] ED for evaluation. Will forward to provider for any further suggestions. Pt agreeable with reporting to ED.

## 2015-12-08 NOTE — Telephone Encounter (Signed)
Agree. Needs MD evaluation.

## 2015-12-22 ENCOUNTER — Ambulatory Visit: Payer: Medicare Other | Admitting: Cardiovascular Disease

## 2015-12-24 DIAGNOSIS — N390 Urinary tract infection, site not specified: Secondary | ICD-10-CM | POA: Diagnosis not present

## 2015-12-24 DIAGNOSIS — J449 Chronic obstructive pulmonary disease, unspecified: Secondary | ICD-10-CM | POA: Diagnosis not present

## 2015-12-24 DIAGNOSIS — R319 Hematuria, unspecified: Secondary | ICD-10-CM | POA: Diagnosis not present

## 2015-12-29 ENCOUNTER — Other Ambulatory Visit (INDEPENDENT_AMBULATORY_CARE_PROVIDER_SITE_OTHER): Payer: Self-pay | Admitting: Internal Medicine

## 2016-01-05 ENCOUNTER — Encounter (INDEPENDENT_AMBULATORY_CARE_PROVIDER_SITE_OTHER): Payer: Self-pay | Admitting: Internal Medicine

## 2016-01-05 ENCOUNTER — Ambulatory Visit (INDEPENDENT_AMBULATORY_CARE_PROVIDER_SITE_OTHER): Payer: Medicare Other | Admitting: Internal Medicine

## 2016-01-05 VITALS — BP 130/70 | HR 110 | Temp 99.0°F | Resp 18 | Ht 63.0 in | Wt 137.7 lb

## 2016-01-05 DIAGNOSIS — K589 Irritable bowel syndrome without diarrhea: Secondary | ICD-10-CM | POA: Diagnosis not present

## 2016-01-05 MED ORDER — DICYCLOMINE HCL 20 MG PO TABS: 20.0000 mg | ORAL_TABLET | Freq: Three times a day (TID) | ORAL | 5 refills | Status: AC | PRN

## 2016-01-05 MED ORDER — DIPHENOXYLATE-ATROPINE 2.5-0.025 MG PO TABS: ORAL_TABLET | ORAL | 2 refills | Status: DC

## 2016-01-05 NOTE — Progress Notes (Signed)
Presenting complaint;  Follow-up for diarrhea/IBS.  Subjective:  [name] [name] is 65 year old Caucasian female who has chronic diarrhea secondary to IBS was here for scheduled visit. She was last seen on 02/24/2015. She is complaining that she was not able to get her Lomotil prescription refilled and was able to get it from Dr. Woody Seller. She states she quit cigarette smoking many years. She feels proud that she was finally able to do so. She has gained 12 pounds since then. She states on most days she has 2 formed stools daily. On few occasions she has large amount of stool but she feels that the commode. She still has diarrhea days when his uncontrollable with incontinence. She denies nausea vomiting melena or rectal bleeding. She states Imodium does not work. She states she has to take as many as 9 Imodium tablets before it would work. She feels Lomotil/diphenoxylate works well. She has not been constipated since last seen. She has chronic low back pain. She states she has 2 more bulging days. She does not take dicyclomine on daily basis. She is taking at least 4 pain pills every day. She uses diazepam on as-needed basis and Phenergan occasionally. She states heartburn is well controlled with therapy.    Current Medications: Outpatient Encounter Prescriptions as of 01/05/2016  Medication Sig  . aspirin EC 81 MG tablet Take 1 tablet (81 mg total) by mouth daily. (Patient taking differently: Take 81 mg by mouth every evening. )  . carisoprodol (SOMA) 350 MG tablet Take 350 mg by mouth as needed for muscle spasms.  . cetirizine (ZYRTEC) 10 MG chewable tablet Chew 10 mg by mouth daily.  . clopidogrel (PLAVIX) 75 MG tablet Take 1 tablet (75 mg total) by mouth daily.  . Cyanocobalamin (VITAMIN B 12 PO) Take 1,000 mcg by mouth daily.  . diazepam (VALIUM) 5 MG tablet Take 5-10 mg by mouth 2 (two) times daily. Takes 1 tab in am and 2 tablets at night  . dicyclomine (BENTYL) 20 MG tablet Take 1 tablet (20 mg  total) by mouth 4 (four) times daily - after meals and at bedtime. (Patient taking differently: Take 20 mg by mouth as needed for spasms. )  . diphenhydrAMINE (BENADRYL) 25 MG tablet Take 25 mg by mouth daily.  . diphenoxylate-atropine (LOMOTIL) 2.5-0.025 MG tablet TAKE ONE TABLET BY MOUTH 4 TIMES DAILY AS NEEDED FOR DIARRHEA OR  LOOSE  STOOLS  . famotidine (PEPCID) 20 MG tablet Take 1 tablet (20 mg total) by mouth 2 (two) times daily.  . furosemide (LASIX) 40 MG tablet Take 1 tablet (40 mg total) by mouth daily as needed (swelling).  . gabapentin (NEURONTIN) 600 MG tablet Take 600 mg by mouth 2 (two) times daily.   Marland Kitchen HYDROcodone-acetaminophen (NORCO) 7.5-325 MG tablet Take 1-2 tablets by mouth every 6 (six) hours as needed.   . hydrOXYzine (VISTARIL) 25 MG capsule Take 25 mg by mouth 2 (two) times daily as needed. For itching  . nitroGLYCERIN (NITROSTAT) 0.4 MG SL tablet Place 1 tablet (0.4 mg total) under the tongue every 5 (five) minutes as needed for chest pain.  . pantoprazole (PROTONIX) 40 MG tablet TAKE ONE TABLET BY MOUTH IN THE MORNING BEFORE  BREAKFAST  . pravastatin (PRAVACHOL) 40 MG tablet Take 40 mg by mouth at bedtime.   . Probiotic Product (PROBIOTIC DAILY PO) Take 1 capsule by mouth daily.   . promethazine (PHENERGAN) 25 MG tablet TAKE ONE TABLET BY MOUTH EVERY 6 HOURS AS NEEDED FOR NAUSEA  .  triamcinolone (NASACORT ALLERGY 24HR) 55 MCG/ACT AERO nasal inhaler Place 2 sprays into the nose daily as needed (for allergies).  . valsartan (DIOVAN) 160 MG tablet Take 1 tablet (160 mg total) by mouth daily as needed. *only take if systolic level exceeds [id number]  . venlafaxine XR (EFFEXOR-XR) 150 MG 24 hr capsule Take 150 mg by mouth at bedtime.  . [DISCONTINUED] carvedilol (COREG) 6.25 MG tablet Take 1 tablet (6.25 mg total) by mouth 2 (two) times daily. (Patient not taking: Reported on 01/05/2016)  . [DISCONTINUED] HYDROmorphone (DILAUDID) 2 MG tablet Take by mouth as needed for severe pain.  Twice a day  . [DISCONTINUED] nebivolol (BYSTOLIC) 5 MG tablet Take 5 mg by mouth daily.  . [DISCONTINUED] nystatin 100000 UNITS vaginal tablet Place 1 tablet vaginally at bedtime.  . [DISCONTINUED] oxyCODONE-acetaminophen (PERCOCET/ROXICET) 5-325 MG per tablet Take 1 tablet by mouth every 6 (six) hours as needed. For pain  . [DISCONTINUED] vitamin E (VITAMIN E) 400 UNIT capsule Take 400 Units by mouth daily.    No facility-administered encounter medications on file as of 01/05/2016.      Objective: Blood pressure 130/70, pulse (!) 110, temperature 99 F (37.2 C), temperature source Oral, resp. rate 18, height 5\' 3"  (1.6 m), weight 137 lb 11.2 oz (62.5 kg). Patient is alert and in no acute distress. Conjunctiva is pink. Sclera is nonicteric Oropharyngeal mucosa is normal. No neck masses or thyromegaly noted. Cardiac exam with regular rhythm normal S1 and S2. No murmur or gallop noted. Lungs are clear to auscultation. Abdomen is symmetrical. Bowel sounds are hyperactive. On palpation abdomen is soft and nontender without organomegaly or masses. No LE edema or clubbing noted.    Assessment:  #1. Irritable bowel syndrome. She mainly has diarrhea which has been difficult to control. She is doing well with diphenoxylate and when necessary dicyclomine. #2. Weight gain. Weight gain is reassuring but most likely because she has quit cigarette smoking. #3. GERD. Symptoms are well controlled with therapy.    Plan:  New prescription given for diphenoxylate 1 tablet by mouth 4 times a day when necessary 120 with 2 refills. Next refill due in 3 months. Dicyclomine prescription also renewed at a dose of 20 mg 3 times a day when necessary 90 with 5 refills. Office visit in one year.

## 2016-01-05 NOTE — Patient Instructions (Signed)
Please call for prescription refill at least 2 days before it is due.

## 2016-01-07 ENCOUNTER — Inpatient Hospital Stay (HOSPITAL_COMMUNITY): Admission: RE | Admit: 2016-01-07 | Payer: Medicare Other | Source: Ambulatory Visit

## 2016-01-08 ENCOUNTER — Encounter: Payer: Self-pay | Admitting: Family

## 2016-01-12 ENCOUNTER — Ambulatory Visit: Payer: Medicare Other | Admitting: Family

## 2016-01-13 DIAGNOSIS — N939 Abnormal uterine and vaginal bleeding, unspecified: Secondary | ICD-10-CM | POA: Diagnosis not present

## 2016-01-13 DIAGNOSIS — M542 Cervicalgia: Secondary | ICD-10-CM | POA: Diagnosis not present

## 2016-01-21 ENCOUNTER — Encounter: Payer: Self-pay | Admitting: Family

## 2016-01-27 ENCOUNTER — Ambulatory Visit: Payer: Medicare Other | Admitting: Family

## 2016-01-28 ENCOUNTER — Encounter: Payer: Self-pay | Admitting: Family

## 2016-01-29 ENCOUNTER — Ambulatory Visit: Payer: Medicare Other | Admitting: Cardiovascular Disease

## 2016-02-01 ENCOUNTER — Ambulatory Visit: Payer: Medicare Other | Admitting: Family

## 2016-02-09 DIAGNOSIS — M545 Low back pain: Secondary | ICD-10-CM | POA: Diagnosis not present

## 2016-02-09 DIAGNOSIS — K219 Gastro-esophageal reflux disease without esophagitis: Secondary | ICD-10-CM | POA: Diagnosis not present

## 2016-02-09 DIAGNOSIS — M542 Cervicalgia: Secondary | ICD-10-CM | POA: Diagnosis not present

## 2016-02-09 DIAGNOSIS — M5412 Radiculopathy, cervical region: Secondary | ICD-10-CM | POA: Diagnosis not present

## 2016-02-09 DIAGNOSIS — K589 Irritable bowel syndrome without diarrhea: Secondary | ICD-10-CM | POA: Diagnosis not present

## 2016-02-09 DIAGNOSIS — Z79891 Long term (current) use of opiate analgesic: Secondary | ICD-10-CM | POA: Diagnosis not present

## 2016-02-09 DIAGNOSIS — I10 Essential (primary) hypertension: Secondary | ICD-10-CM | POA: Diagnosis not present

## 2016-02-09 DIAGNOSIS — I2510 Atherosclerotic heart disease of native coronary artery without angina pectoris: Secondary | ICD-10-CM | POA: Diagnosis not present

## 2016-02-23 ENCOUNTER — Ambulatory Visit (INDEPENDENT_AMBULATORY_CARE_PROVIDER_SITE_OTHER): Payer: Medicare Other | Admitting: Internal Medicine

## 2016-02-23 ENCOUNTER — Ambulatory Visit: Payer: Medicare Other | Admitting: Vascular Surgery

## 2016-02-25 ENCOUNTER — Encounter: Payer: Self-pay | Admitting: Vascular Surgery

## 2016-03-02 ENCOUNTER — Ambulatory Visit: Payer: Medicare Other | Admitting: Vascular Surgery

## 2016-03-06 DIAGNOSIS — R55 Syncope and collapse: Secondary | ICD-10-CM | POA: Diagnosis not present

## 2016-03-06 DIAGNOSIS — Z9181 History of falling: Secondary | ICD-10-CM | POA: Diagnosis not present

## 2016-03-06 DIAGNOSIS — S199XXA Unspecified injury of neck, initial encounter: Secondary | ICD-10-CM | POA: Diagnosis not present

## 2016-03-06 DIAGNOSIS — Z882 Allergy status to sulfonamides status: Secondary | ICD-10-CM | POA: Diagnosis not present

## 2016-03-06 DIAGNOSIS — S098XXA Other specified injuries of head, initial encounter: Secondary | ICD-10-CM | POA: Diagnosis not present

## 2016-03-06 DIAGNOSIS — M542 Cervicalgia: Secondary | ICD-10-CM | POA: Diagnosis not present

## 2016-03-06 DIAGNOSIS — Z888 Allergy status to other drugs, medicaments and biological substances status: Secondary | ICD-10-CM | POA: Diagnosis not present

## 2016-03-06 DIAGNOSIS — I2510 Atherosclerotic heart disease of native coronary artery without angina pectoris: Secondary | ICD-10-CM | POA: Diagnosis not present

## 2016-03-06 DIAGNOSIS — W1839XA Other fall on same level, initial encounter: Secondary | ICD-10-CM | POA: Diagnosis not present

## 2016-03-06 DIAGNOSIS — S161XXA Strain of muscle, fascia and tendon at neck level, initial encounter: Secondary | ICD-10-CM | POA: Diagnosis not present

## 2016-03-06 DIAGNOSIS — S299XXA Unspecified injury of thorax, initial encounter: Secondary | ICD-10-CM | POA: Diagnosis not present

## 2016-03-06 DIAGNOSIS — R42 Dizziness and giddiness: Secondary | ICD-10-CM | POA: Diagnosis not present

## 2016-03-06 DIAGNOSIS — Z23 Encounter for immunization: Secondary | ICD-10-CM | POA: Diagnosis not present

## 2016-03-06 DIAGNOSIS — S0990XA Unspecified injury of head, initial encounter: Secondary | ICD-10-CM | POA: Diagnosis not present

## 2016-03-06 DIAGNOSIS — S064X0A Epidural hemorrhage without loss of consciousness, initial encounter: Secondary | ICD-10-CM | POA: Diagnosis not present

## 2016-03-06 DIAGNOSIS — R072 Precordial pain: Secondary | ICD-10-CM | POA: Diagnosis not present

## 2016-03-07 DIAGNOSIS — R42 Dizziness and giddiness: Secondary | ICD-10-CM | POA: Diagnosis not present

## 2016-03-07 DIAGNOSIS — I2510 Atherosclerotic heart disease of native coronary artery without angina pectoris: Secondary | ICD-10-CM | POA: Diagnosis not present

## 2016-03-07 DIAGNOSIS — Z882 Allergy status to sulfonamides status: Secondary | ICD-10-CM | POA: Diagnosis not present

## 2016-03-07 DIAGNOSIS — Z9181 History of falling: Secondary | ICD-10-CM | POA: Diagnosis not present

## 2016-03-15 ENCOUNTER — Other Ambulatory Visit: Payer: Self-pay | Admitting: Cardiovascular Disease

## 2016-03-15 ENCOUNTER — Ambulatory Visit (INDEPENDENT_AMBULATORY_CARE_PROVIDER_SITE_OTHER): Payer: Medicare Other | Admitting: Internal Medicine

## 2016-03-17 DIAGNOSIS — Z299 Encounter for prophylactic measures, unspecified: Secondary | ICD-10-CM | POA: Diagnosis not present

## 2016-03-17 DIAGNOSIS — R319 Hematuria, unspecified: Secondary | ICD-10-CM | POA: Diagnosis not present

## 2016-03-17 DIAGNOSIS — R42 Dizziness and giddiness: Secondary | ICD-10-CM | POA: Diagnosis not present

## 2016-03-30 ENCOUNTER — Encounter: Payer: Self-pay | Admitting: Vascular Surgery

## 2016-04-01 ENCOUNTER — Ambulatory Visit: Payer: Medicare Other | Admitting: Cardiovascular Disease

## 2016-04-12 ENCOUNTER — Other Ambulatory Visit: Payer: Self-pay | Admitting: Cardiovascular Disease

## 2016-04-12 ENCOUNTER — Ambulatory Visit: Payer: Medicare Other | Admitting: Vascular Surgery

## 2016-04-22 DIAGNOSIS — Z7189 Other specified counseling: Secondary | ICD-10-CM | POA: Diagnosis not present

## 2016-04-22 DIAGNOSIS — M545 Low back pain: Secondary | ICD-10-CM | POA: Diagnosis not present

## 2016-04-22 DIAGNOSIS — E7800 Pure hypercholesterolemia, unspecified: Secondary | ICD-10-CM | POA: Diagnosis not present

## 2016-04-22 DIAGNOSIS — Z1389 Encounter for screening for other disorder: Secondary | ICD-10-CM | POA: Diagnosis not present

## 2016-04-22 DIAGNOSIS — Z0000 Encounter for general adult medical examination without abnormal findings: Secondary | ICD-10-CM | POA: Diagnosis not present

## 2016-04-22 DIAGNOSIS — Z299 Encounter for prophylactic measures, unspecified: Secondary | ICD-10-CM | POA: Diagnosis not present

## 2016-04-22 DIAGNOSIS — Z79899 Other long term (current) drug therapy: Secondary | ICD-10-CM | POA: Diagnosis not present

## 2016-04-22 DIAGNOSIS — Z1211 Encounter for screening for malignant neoplasm of colon: Secondary | ICD-10-CM | POA: Diagnosis not present

## 2016-04-27 DIAGNOSIS — K589 Irritable bowel syndrome without diarrhea: Secondary | ICD-10-CM | POA: Diagnosis not present

## 2016-04-27 DIAGNOSIS — M542 Cervicalgia: Secondary | ICD-10-CM | POA: Diagnosis not present

## 2016-04-27 DIAGNOSIS — M545 Low back pain: Secondary | ICD-10-CM | POA: Diagnosis not present

## 2016-04-27 DIAGNOSIS — R55 Syncope and collapse: Secondary | ICD-10-CM | POA: Diagnosis not present

## 2016-04-27 DIAGNOSIS — K219 Gastro-esophageal reflux disease without esophagitis: Secondary | ICD-10-CM | POA: Diagnosis not present

## 2016-04-27 DIAGNOSIS — M5412 Radiculopathy, cervical region: Secondary | ICD-10-CM | POA: Diagnosis not present

## 2016-04-27 DIAGNOSIS — I10 Essential (primary) hypertension: Secondary | ICD-10-CM | POA: Diagnosis not present

## 2016-04-27 DIAGNOSIS — Z79891 Long term (current) use of opiate analgesic: Secondary | ICD-10-CM | POA: Diagnosis not present

## 2016-04-27 DIAGNOSIS — I2510 Atherosclerotic heart disease of native coronary artery without angina pectoris: Secondary | ICD-10-CM | POA: Diagnosis not present

## 2016-05-06 ENCOUNTER — Encounter: Payer: Self-pay | Admitting: Vascular Surgery

## 2016-05-11 DIAGNOSIS — E7800 Pure hypercholesterolemia, unspecified: Secondary | ICD-10-CM | POA: Diagnosis not present

## 2016-05-11 DIAGNOSIS — I10 Essential (primary) hypertension: Secondary | ICD-10-CM | POA: Diagnosis not present

## 2016-05-11 DIAGNOSIS — J449 Chronic obstructive pulmonary disease, unspecified: Secondary | ICD-10-CM | POA: Diagnosis not present

## 2016-05-11 DIAGNOSIS — I2510 Atherosclerotic heart disease of native coronary artery without angina pectoris: Secondary | ICD-10-CM | POA: Diagnosis not present

## 2016-05-13 ENCOUNTER — Encounter: Payer: Self-pay | Admitting: *Deleted

## 2016-05-17 ENCOUNTER — Ambulatory Visit: Payer: Medicare Other | Admitting: Cardiovascular Disease

## 2016-05-24 ENCOUNTER — Other Ambulatory Visit: Payer: Self-pay | Admitting: Cardiovascular Disease

## 2016-05-24 ENCOUNTER — Other Ambulatory Visit (INDEPENDENT_AMBULATORY_CARE_PROVIDER_SITE_OTHER): Payer: Self-pay | Admitting: Internal Medicine

## 2016-05-24 ENCOUNTER — Ambulatory Visit: Payer: Medicare Other | Admitting: Vascular Surgery

## 2016-05-26 DIAGNOSIS — M47812 Spondylosis without myelopathy or radiculopathy, cervical region: Secondary | ICD-10-CM | POA: Diagnosis not present

## 2016-05-27 DIAGNOSIS — M5412 Radiculopathy, cervical region: Secondary | ICD-10-CM | POA: Diagnosis not present

## 2016-05-27 DIAGNOSIS — K589 Irritable bowel syndrome without diarrhea: Secondary | ICD-10-CM | POA: Diagnosis not present

## 2016-05-27 DIAGNOSIS — I2510 Atherosclerotic heart disease of native coronary artery without angina pectoris: Secondary | ICD-10-CM | POA: Diagnosis not present

## 2016-05-27 DIAGNOSIS — K219 Gastro-esophageal reflux disease without esophagitis: Secondary | ICD-10-CM | POA: Diagnosis not present

## 2016-05-27 DIAGNOSIS — G43701 Chronic migraine without aura, not intractable, with status migrainosus: Secondary | ICD-10-CM | POA: Diagnosis not present

## 2016-05-27 DIAGNOSIS — R55 Syncope and collapse: Secondary | ICD-10-CM | POA: Diagnosis not present

## 2016-05-27 DIAGNOSIS — M542 Cervicalgia: Secondary | ICD-10-CM | POA: Diagnosis not present

## 2016-05-27 DIAGNOSIS — I10 Essential (primary) hypertension: Secondary | ICD-10-CM | POA: Diagnosis not present

## 2016-05-27 DIAGNOSIS — M545 Low back pain: Secondary | ICD-10-CM | POA: Diagnosis not present

## 2016-06-07 ENCOUNTER — Encounter: Payer: Self-pay | Admitting: Vascular Surgery

## 2016-06-07 DIAGNOSIS — Z79891 Long term (current) use of opiate analgesic: Secondary | ICD-10-CM | POA: Diagnosis not present

## 2016-06-07 DIAGNOSIS — G894 Chronic pain syndrome: Secondary | ICD-10-CM | POA: Diagnosis not present

## 2016-06-07 DIAGNOSIS — M5033 Other cervical disc degeneration, cervicothoracic region: Secondary | ICD-10-CM | POA: Diagnosis not present

## 2016-06-07 DIAGNOSIS — M5031 Other cervical disc degeneration,  high cervical region: Secondary | ICD-10-CM | POA: Diagnosis not present

## 2016-06-07 DIAGNOSIS — M542 Cervicalgia: Secondary | ICD-10-CM | POA: Diagnosis not present

## 2016-06-08 ENCOUNTER — Other Ambulatory Visit: Payer: Self-pay | Admitting: Nurse Practitioner

## 2016-06-08 DIAGNOSIS — M47812 Spondylosis without myelopathy or radiculopathy, cervical region: Secondary | ICD-10-CM

## 2016-06-10 DIAGNOSIS — I10 Essential (primary) hypertension: Secondary | ICD-10-CM | POA: Diagnosis not present

## 2016-06-10 DIAGNOSIS — J449 Chronic obstructive pulmonary disease, unspecified: Secondary | ICD-10-CM | POA: Diagnosis not present

## 2016-06-10 DIAGNOSIS — E7800 Pure hypercholesterolemia, unspecified: Secondary | ICD-10-CM | POA: Diagnosis not present

## 2016-06-10 DIAGNOSIS — I2510 Atherosclerotic heart disease of native coronary artery without angina pectoris: Secondary | ICD-10-CM | POA: Diagnosis not present

## 2016-06-14 ENCOUNTER — Ambulatory Visit: Payer: Medicare Other | Admitting: Vascular Surgery

## 2016-06-16 ENCOUNTER — Inpatient Hospital Stay
Admission: RE | Admit: 2016-06-16 | Discharge: 2016-06-16 | Disposition: A | Discharge status: Home / Self Care | Payer: Medicare Other | Source: Ambulatory Visit | Attending: Nurse Practitioner | Admitting: Nurse Practitioner

## 2016-06-16 NOTE — Discharge Instructions (Signed)

## 2016-06-20 ENCOUNTER — Inpatient Hospital Stay
Admission: RE | Admit: 2016-06-20 | Discharge: 2016-06-20 | Disposition: A | Discharge status: Home / Self Care | Payer: Medicare Other | Source: Ambulatory Visit | Attending: Nurse Practitioner | Admitting: Nurse Practitioner

## 2016-06-20 NOTE — Discharge Instructions (Signed)

## 2016-06-21 ENCOUNTER — Encounter: Payer: Self-pay | Admitting: Cardiovascular Disease

## 2016-06-21 ENCOUNTER — Ambulatory Visit (INDEPENDENT_AMBULATORY_CARE_PROVIDER_SITE_OTHER): Payer: Medicare Other | Admitting: Cardiovascular Disease

## 2016-06-21 VITALS — BP 168/110 | HR 83 | Ht 62.0 in | Wt 138.0 lb

## 2016-06-21 DIAGNOSIS — I209 Angina pectoris, unspecified: Secondary | ICD-10-CM | POA: Diagnosis not present

## 2016-06-21 DIAGNOSIS — I25118 Atherosclerotic heart disease of native coronary artery with other forms of angina pectoris: Secondary | ICD-10-CM

## 2016-06-21 DIAGNOSIS — I10 Essential (primary) hypertension: Secondary | ICD-10-CM | POA: Diagnosis not present

## 2016-06-21 DIAGNOSIS — E785 Hyperlipidemia, unspecified: Secondary | ICD-10-CM

## 2016-06-21 DIAGNOSIS — I714 Abdominal aortic aneurysm, without rupture, unspecified: Secondary | ICD-10-CM

## 2016-06-21 DIAGNOSIS — Z955 Presence of coronary angioplasty implant and graft: Secondary | ICD-10-CM

## 2016-06-21 DIAGNOSIS — R079 Chest pain, unspecified: Secondary | ICD-10-CM | POA: Diagnosis not present

## 2016-06-21 MED ORDER — CARVEDILOL 6.25 MG PO TABS: 6.2500 mg | ORAL_TABLET | Freq: Two times a day (BID) | ORAL | 6 refills | Status: DC

## 2016-06-21 MED ORDER — VALSARTAN 320 MG PO TABS: 320.0000 mg | ORAL_TABLET | Freq: Every day | ORAL | 6 refills | Status: DC

## 2016-06-21 NOTE — Patient Instructions (Addendum)
Medication Instructions:   Increase Diovan to 320mg  daily.  Continue all other medications.    Labwork: none  Testing/Procedures: Your physician has requested that you have an echocardiogram. Echocardiography is a painless test that uses sound waves to create images of your heart. It provides your doctor with information about the size and shape of your heart and how well your heart's chambers and valves are working. This procedure takes approximately one hour. There are no restrictions for this procedure. Office will contact with results via phone or letter.    Follow-Up: 2 months  Any Other Special Instructions Will Be Listed Below (If Applicable).  If you need a refill on your cardiac medications before your next appointment, please call your pharmacy.

## 2016-06-21 NOTE — Progress Notes (Signed)
SUBJECTIVE: The patient presents for past due follow-up for coronary artery disease and hypertension. She has been having more chest pain recently which has awoken her from sleep. She has had a lot of stress. She was reportedly hospitalized for syncope in October 2017. She took 3 of her husband's nitroglycerin tablets in the past several weeks. She also complains of left arm swelling. Blood pressure today 168/110.  ECG performed in the office today which I personally reviewed demonstrates normal sinus rhythm with no ischemic ST segment or T-wave abnormalities, nor any arrhythmias.   Review of Systems: As per "subjective", otherwise negative.  Allergies  Allergen Reactions  . Amlodipine Besylate Hives  . Doxazosin Mesylate Hives  . Erythromycin Hives  . Lisinopril Hives  . Metoprolol Tartrate Hives  . Oxycodone Hives and Itching    Benadryl is needed with dose  . Penicillins Hives  . Percocet [Oxycodone-Acetaminophen] Itching    Must take med with hydroxyzine    Current Outpatient Prescriptions  Medication Sig Dispense Refill  . aspirin EC 81 MG tablet Take 1 tablet (81 mg total) by mouth daily. (Patient taking differently: Take 81 mg by mouth every evening. )    . carisoprodol (SOMA) 350 MG tablet Take 350 mg by mouth as needed for muscle spasms.    . cetirizine (ZYRTEC) 10 MG chewable tablet Chew 10 mg by mouth daily.    . clopidogrel (PLAVIX) 75 MG tablet TAKE ONE TABLET BY MOUTH ONCE DAILY 30 tablet 0  . Cyanocobalamin (VITAMIN B 12 PO) Take 1,000 mcg by mouth daily.    . diazepam (VALIUM) 5 MG tablet Take 5-10 mg by mouth 2 (two) times daily. Takes 1 tab in am and 2 tablets at night    . dicyclomine (BENTYL) 20 MG tablet Take 1 tablet (20 mg total) by mouth 3 (three) times daily as needed for spasms. 90 tablet 5  . diphenhydrAMINE (BENADRYL) 25 MG tablet Take 25 mg by mouth daily.    . diphenoxylate-atropine (LOMOTIL) 2.5-0.025 MG tablet TAKE ONE TABLET BY MOUTH 4 TIMES  DAILY AS NEEDED FOR  DIARRHEA  OR  LOOSE  STOOLS 120 tablet 2  . famotidine (PEPCID) 20 MG tablet Take 1 tablet (20 mg total) by mouth 2 (two) times daily. 60 tablet 5  . furosemide (LASIX) 40 MG tablet Take 1 tablet (40 mg total) by mouth daily as needed (swelling). 30 tablet 6  . gabapentin (NEURONTIN) 600 MG tablet Take 600 mg by mouth 2 (two) times daily.     Marland Kitchen HYDROcodone-acetaminophen (NORCO) 7.5-325 MG tablet Take 1-2 tablets by mouth every 6 (six) hours as needed.     . hydrOXYzine (VISTARIL) 25 MG capsule Take 25 mg by mouth 2 (two) times daily as needed. For itching    . nitroGLYCERIN (NITROSTAT) 0.4 MG SL tablet Place 1 tablet (0.4 mg total) under the tongue every 5 (five) minutes as needed for chest pain. 25 tablet 3  . pantoprazole (PROTONIX) 40 MG tablet TAKE ONE TABLET BY MOUTH IN THE MORNING BEFORE  BREAKFAST 30 tablet 5  . pravastatin (PRAVACHOL) 40 MG tablet Take 40 mg by mouth at bedtime.     . Probiotic Product (PROBIOTIC DAILY PO) Take 1 capsule by mouth daily.     . promethazine (PHENERGAN) 25 MG tablet TAKE ONE TABLET BY MOUTH EVERY 6 HOURS AS NEEDED FOR NAUSEA 30 tablet 1  . triamcinolone (NASACORT ALLERGY 24HR) 55 MCG/ACT AERO nasal inhaler Place 2 sprays into the  nose daily as needed (for allergies).    . valsartan (DIOVAN) 160 MG tablet TAKE ONE TABLET BY MOUTH ONCE DAILY AS NEEDED **TAKE ONLY IF SYSTOLIC LEVEL EXCEEDS 154** 90 tablet 3  . venlafaxine XR (EFFEXOR-XR) 150 MG 24 hr capsule Take 150 mg by mouth at bedtime.     No current facility-administered medications for this visit.     Past Medical History:  Diagnosis Date  . AAA (abdominal aortic aneurysm) ([place])   . Abdominal cramping 09/21/2010  . Anxiety   . Aortic aneurysm, abdominal ([place])   . Arthritis   . Cancer [facility]) 2010   right breast   mastectomy and cheotherapy  . Chronic diarrhea   . Coronary artery disease    Status post drug eluding stent to the right coronary artery status post in-stent  restenosis.  DAPT  . Depression   . Diverticulosis   . GERD (gastroesophageal reflux disease)   . Hyperlipidemia   . Hypertension   . IBS (irritable bowel syndrome)     Past Surgical History:  Procedure Laterality Date  . APPENDECTOMY  1995  . BIOPSY N/A 05/07/2014   Procedure: BIOPSY;  Surgeon: Rogene Houston, MD;  Location: [facility];  Service: Endoscopy;  Laterality: N/A;  . CATARACT EXTRACTION W/PHACO Left 07/19/2012   Procedure: CATARACT EXTRACTION PHACO AND INTRAOCULAR LENS PLACEMENT (IOC);  Surgeon: Tonny Branch, MD;  Location: [facility];  Service: Ophthalmology;  Laterality: Left;  CDE:9.95  . CERVICAL FUSION     x6  . COLONOSCOPY    . CORONARY ANGIOPLASTY WITH STENT PLACEMENT  2000-2010   3 cardiac stent splaced per patient  . CT ANGIOGRAPHY  11/02/2010   ABD &PELV  . ESOPHAGOGASTRODUODENOSCOPY (EGD) WITH PROPOFOL N/A 05/07/2014   Procedure: ESOPHAGOGASTRODUODENOSCOPY (EGD) WITH PROPOFOL;  Surgeon: Rogene Houston, MD;  Location: [facility];  Service: Endoscopy;  Laterality: N/A;  . MALONEY DILATION N/A 05/07/2014   Procedure: MALONEY DILATION 56 cm;  Surgeon: Rogene Houston, MD;  Location: [facility];  Service: Endoscopy;  Laterality: N/A;  . MASTECTOMY  05/27/2008   RIGHT  . SMALL BOWEL GIVENS  01/06/2009    Social History   Social History  . Marital status: Married    Spouse name: N/A  . Number of children: N/A  . Years of education: N/A   Occupational History  . Not on file.   Social History Main Topics  . Smoking status: Former Smoker    Packs/day: 1.00    Years: 45.00    Types: Cigarettes    Start date: 06/04/1965    Quit date: 05/16/2016  . Smokeless tobacco: Never Used     Comment: using e cigs, but she does smoke  . Alcohol use No  . Drug use: No  . Sexual activity: Not on file   Other Topics Concern  . Not on file   Social History Narrative  . No narrative on file     Vitals:   06/21/16 1627  BP: (!) 168/110  Pulse: 83  SpO2: 95%  Weight: 138 lb  (62.6 kg)  Height: 5\' 2"  (1.575 m)    PHYSICAL EXAM General: NAD HEENT: Normal. Neck: No JVD, no thyromegaly. Lungs: Clear to auscultation bilaterally with normal respiratory effort. CV: Nondisplaced PMI.  Regular rate and rhythm, normal S1/S2, no S3/S4, no murmur. No pretibial or periankle edema.  No carotid bruit.   Abdomen: Soft, nontender, no distention.  Neurologic: Alert and oriented.  Psych: Normal affect. Skin: Normal. Musculoskeletal: No gross  deformities.    ECG: Most recent ECG reviewed.      ASSESSMENT AND PLAN: 1. Chest pain in context of CAD with prior stenting of LAD and RCA with mild instent restenosis in 01/2013: More frequent chest pain. May be due to poorly controlled BP. ECG is normal.  Will obtain echocardiogram. Continue aspirin 81 mg daily, Coreg 6.25 mg bid, Plavix 75 mg daily, and pravastatin 40 mg daily.   2. Essential HTN: Markedly elevated. Will increase Diovan to 320 mg daily.  3. Hyperlipidemia: Obtain copy of lipids from PCP. Continue pravastatin 40 mg daily.  4. AAA: Followed by [facility]. 36 mm by CT in June 2016.  Dispo: f/u 2 months  Kate Sable, M.D., F.A.C.C.

## 2016-06-23 MED ORDER — CARVEDILOL 6.25 MG PO TABS: 6.2500 mg | ORAL_TABLET | Freq: Two times a day (BID) | ORAL | 3 refills | Status: AC

## 2016-06-23 MED ORDER — VALSARTAN 320 MG PO TABS: 320.0000 mg | ORAL_TABLET | Freq: Every day | ORAL | 3 refills | Status: DC

## 2016-06-23 NOTE — Addendum Note (Signed)
Addended by: Laurine Blazer on: 06/23/2016 09:33 AM   Modules accepted: Orders

## 2016-06-24 ENCOUNTER — Telehealth: Payer: Self-pay | Admitting: *Deleted

## 2016-06-24 NOTE — Telephone Encounter (Signed)
Mena Goes, RN sent to [name] [name]        Give her one more attempt. According to Dr. Kristian Covey note, he thinks we are following it from 2016 note. So 1 more time and then put a note in EPIC about all the times we have tried. Thanks   Previous Messages    ----- Message -----  From: [name] [name]  Sent: 06/24/2016 11:20 AM  To: Mena Goes, RN  Subject: APPT?                       We have been trying to get this appt set up with this pt and has been numerous times and then no showed or canceled since 05/05/15 and has yet to keep the appt. Do you want me to try again to make the appt? Please advise

## 2016-07-04 DIAGNOSIS — I10 Essential (primary) hypertension: Secondary | ICD-10-CM | POA: Diagnosis not present

## 2016-07-04 DIAGNOSIS — I2510 Atherosclerotic heart disease of native coronary artery without angina pectoris: Secondary | ICD-10-CM | POA: Diagnosis not present

## 2016-07-04 DIAGNOSIS — M5412 Radiculopathy, cervical region: Secondary | ICD-10-CM | POA: Diagnosis not present

## 2016-07-04 DIAGNOSIS — K219 Gastro-esophageal reflux disease without esophagitis: Secondary | ICD-10-CM | POA: Diagnosis not present

## 2016-07-04 DIAGNOSIS — M542 Cervicalgia: Secondary | ICD-10-CM | POA: Diagnosis not present

## 2016-07-04 DIAGNOSIS — G43701 Chronic migraine without aura, not intractable, with status migrainosus: Secondary | ICD-10-CM | POA: Diagnosis not present

## 2016-07-04 DIAGNOSIS — M545 Low back pain: Secondary | ICD-10-CM | POA: Diagnosis not present

## 2016-07-04 DIAGNOSIS — R55 Syncope and collapse: Secondary | ICD-10-CM | POA: Diagnosis not present

## 2016-07-04 DIAGNOSIS — K589 Irritable bowel syndrome without diarrhea: Secondary | ICD-10-CM | POA: Diagnosis not present

## 2016-07-13 ENCOUNTER — Other Ambulatory Visit: Payer: Self-pay | Admitting: Cardiovascular Disease

## 2016-07-13 ENCOUNTER — Other Ambulatory Visit: Payer: Medicare Other

## 2016-07-15 ENCOUNTER — Encounter: Payer: Self-pay | Admitting: Vascular Surgery

## 2016-07-19 ENCOUNTER — Inpatient Hospital Stay
Admission: RE | Admit: 2016-07-19 | Discharge: 2016-07-19 | Disposition: A | Discharge status: Home / Self Care | Payer: Medicare Other | Source: Ambulatory Visit | Attending: Nurse Practitioner | Admitting: Nurse Practitioner

## 2016-07-19 ENCOUNTER — Other Ambulatory Visit: Payer: Medicare Other

## 2016-07-19 NOTE — Discharge Instructions (Signed)

## 2016-07-25 ENCOUNTER — Other Ambulatory Visit: Payer: Medicare Other

## 2016-07-26 ENCOUNTER — Ambulatory Visit: Payer: Medicare Other

## 2016-07-26 DIAGNOSIS — Z841 Family history of disorders of kidney and ureter: Secondary | ICD-10-CM | POA: Diagnosis not present

## 2016-07-26 DIAGNOSIS — R1084 Generalized abdominal pain: Secondary | ICD-10-CM | POA: Diagnosis not present

## 2016-07-26 DIAGNOSIS — I129 Hypertensive chronic kidney disease with stage 1 through stage 4 chronic kidney disease, or unspecified chronic kidney disease: Secondary | ICD-10-CM | POA: Diagnosis not present

## 2016-07-26 DIAGNOSIS — N179 Acute kidney failure, unspecified: Secondary | ICD-10-CM | POA: Diagnosis not present

## 2016-07-26 DIAGNOSIS — D649 Anemia, unspecified: Secondary | ICD-10-CM | POA: Diagnosis not present

## 2016-07-26 DIAGNOSIS — K625 Hemorrhage of anus and rectum: Secondary | ICD-10-CM | POA: Diagnosis not present

## 2016-07-26 DIAGNOSIS — K922 Gastrointestinal hemorrhage, unspecified: Secondary | ICD-10-CM | POA: Diagnosis not present

## 2016-07-26 DIAGNOSIS — K921 Melena: Secondary | ICD-10-CM | POA: Diagnosis not present

## 2016-07-26 DIAGNOSIS — N189 Chronic kidney disease, unspecified: Secondary | ICD-10-CM | POA: Diagnosis not present

## 2016-07-27 DIAGNOSIS — Z87891 Personal history of nicotine dependence: Secondary | ICD-10-CM | POA: Diagnosis not present

## 2016-07-27 DIAGNOSIS — Z7982 Long term (current) use of aspirin: Secondary | ICD-10-CM | POA: Diagnosis not present

## 2016-07-27 DIAGNOSIS — Z955 Presence of coronary angioplasty implant and graft: Secondary | ICD-10-CM | POA: Diagnosis not present

## 2016-07-27 DIAGNOSIS — N179 Acute kidney failure, unspecified: Secondary | ICD-10-CM | POA: Diagnosis present

## 2016-07-27 DIAGNOSIS — I2510 Atherosclerotic heart disease of native coronary artery without angina pectoris: Secondary | ICD-10-CM | POA: Diagnosis present

## 2016-07-27 DIAGNOSIS — Z882 Allergy status to sulfonamides status: Secondary | ICD-10-CM | POA: Diagnosis not present

## 2016-07-27 DIAGNOSIS — D649 Anemia, unspecified: Secondary | ICD-10-CM | POA: Diagnosis present

## 2016-07-27 DIAGNOSIS — M47897 Other spondylosis, lumbosacral region: Secondary | ICD-10-CM | POA: Diagnosis present

## 2016-07-27 DIAGNOSIS — Z841 Family history of disorders of kidney and ureter: Secondary | ICD-10-CM | POA: Diagnosis not present

## 2016-07-27 DIAGNOSIS — E7800 Pure hypercholesterolemia, unspecified: Secondary | ICD-10-CM | POA: Diagnosis present

## 2016-07-27 DIAGNOSIS — Z79899 Other long term (current) drug therapy: Secondary | ICD-10-CM | POA: Diagnosis not present

## 2016-07-27 DIAGNOSIS — Z7902 Long term (current) use of antithrombotics/antiplatelets: Secondary | ICD-10-CM | POA: Diagnosis not present

## 2016-07-27 DIAGNOSIS — Z880 Allergy status to penicillin: Secondary | ICD-10-CM | POA: Diagnosis not present

## 2016-07-27 DIAGNOSIS — N189 Chronic kidney disease, unspecified: Secondary | ICD-10-CM | POA: Diagnosis present

## 2016-07-27 DIAGNOSIS — E861 Hypovolemia: Secondary | ICD-10-CM | POA: Diagnosis present

## 2016-07-27 DIAGNOSIS — Z853 Personal history of malignant neoplasm of breast: Secondary | ICD-10-CM | POA: Diagnosis not present

## 2016-07-27 DIAGNOSIS — N19 Unspecified kidney failure: Secondary | ICD-10-CM | POA: Diagnosis not present

## 2016-07-27 DIAGNOSIS — K580 Irritable bowel syndrome with diarrhea: Secondary | ICD-10-CM | POA: Diagnosis present

## 2016-07-27 DIAGNOSIS — I129 Hypertensive chronic kidney disease with stage 1 through stage 4 chronic kidney disease, or unspecified chronic kidney disease: Secondary | ICD-10-CM | POA: Diagnosis present

## 2016-07-27 DIAGNOSIS — K921 Melena: Secondary | ICD-10-CM | POA: Diagnosis present

## 2016-07-27 DIAGNOSIS — M542 Cervicalgia: Secondary | ICD-10-CM | POA: Diagnosis present

## 2016-07-27 DIAGNOSIS — G8929 Other chronic pain: Secondary | ICD-10-CM | POA: Diagnosis present

## 2016-07-27 DIAGNOSIS — M549 Dorsalgia, unspecified: Secondary | ICD-10-CM | POA: Diagnosis present

## 2016-07-27 DIAGNOSIS — Z888 Allergy status to other drugs, medicaments and biological substances status: Secondary | ICD-10-CM | POA: Diagnosis not present

## 2016-07-27 DIAGNOSIS — Z881 Allergy status to other antibiotic agents status: Secondary | ICD-10-CM | POA: Diagnosis not present

## 2016-07-27 DIAGNOSIS — N3289 Other specified disorders of bladder: Secondary | ICD-10-CM | POA: Diagnosis not present

## 2016-07-27 DIAGNOSIS — M898X9 Other specified disorders of bone, unspecified site: Secondary | ICD-10-CM | POA: Diagnosis present

## 2016-07-28 ENCOUNTER — Inpatient Hospital Stay
Admission: RE | Admit: 2016-07-28 | Discharge: 2016-07-28 | Disposition: A | Discharge status: Home / Self Care | Payer: Medicare Other | Source: Ambulatory Visit | Attending: Nurse Practitioner | Admitting: Nurse Practitioner

## 2016-07-28 NOTE — Discharge Instructions (Signed)

## 2016-08-03 ENCOUNTER — Other Ambulatory Visit: Payer: Medicare Other

## 2016-08-04 ENCOUNTER — Ambulatory Visit (INDEPENDENT_AMBULATORY_CARE_PROVIDER_SITE_OTHER): Payer: Medicare Other | Admitting: Internal Medicine

## 2016-08-11 ENCOUNTER — Telehealth (INDEPENDENT_AMBULATORY_CARE_PROVIDER_SITE_OTHER): Payer: Self-pay | Admitting: Internal Medicine

## 2016-08-11 ENCOUNTER — Telehealth (INDEPENDENT_AMBULATORY_CARE_PROVIDER_SITE_OTHER): Payer: Self-pay | Admitting: *Deleted

## 2016-08-11 ENCOUNTER — Other Ambulatory Visit (INDEPENDENT_AMBULATORY_CARE_PROVIDER_SITE_OTHER): Payer: Self-pay | Admitting: Internal Medicine

## 2016-08-11 DIAGNOSIS — G43701 Chronic migraine without aura, not intractable, with status migrainosus: Secondary | ICD-10-CM | POA: Diagnosis not present

## 2016-08-11 DIAGNOSIS — R55 Syncope and collapse: Secondary | ICD-10-CM | POA: Diagnosis not present

## 2016-08-11 DIAGNOSIS — I2510 Atherosclerotic heart disease of native coronary artery without angina pectoris: Secondary | ICD-10-CM | POA: Diagnosis not present

## 2016-08-11 DIAGNOSIS — M542 Cervicalgia: Secondary | ICD-10-CM | POA: Diagnosis not present

## 2016-08-11 DIAGNOSIS — K219 Gastro-esophageal reflux disease without esophagitis: Secondary | ICD-10-CM | POA: Diagnosis not present

## 2016-08-11 DIAGNOSIS — K589 Irritable bowel syndrome without diarrhea: Secondary | ICD-10-CM | POA: Diagnosis not present

## 2016-08-11 DIAGNOSIS — Z299 Encounter for prophylactic measures, unspecified: Secondary | ICD-10-CM | POA: Diagnosis not present

## 2016-08-11 DIAGNOSIS — Z6826 Body mass index (BMI) 26.0-26.9, adult: Secondary | ICD-10-CM | POA: Diagnosis not present

## 2016-08-11 DIAGNOSIS — J449 Chronic obstructive pulmonary disease, unspecified: Secondary | ICD-10-CM | POA: Diagnosis not present

## 2016-08-11 DIAGNOSIS — K5732 Diverticulitis of large intestine without perforation or abscess without bleeding: Secondary | ICD-10-CM | POA: Diagnosis not present

## 2016-08-11 DIAGNOSIS — M545 Low back pain: Secondary | ICD-10-CM | POA: Diagnosis not present

## 2016-08-11 DIAGNOSIS — M5412 Radiculopathy, cervical region: Secondary | ICD-10-CM | POA: Diagnosis not present

## 2016-08-11 DIAGNOSIS — I10 Essential (primary) hypertension: Secondary | ICD-10-CM | POA: Diagnosis not present

## 2016-08-11 NOTE — Telephone Encounter (Signed)
Erin from [place] called and stated that patient had had this refilled on 07/27/2016 by a Dr A.Florene Glen with [facility] states it is here in [place]. It was for the Lomotil - with the instructions take 2 by mouth four times daily up to 8 per day. This has refills on it.  Earlier today we rec'd a refill request for the Lomotil - taking 1 up to 4 times daily. Dispensing #120 2 refills . The patient has to wait 30 days prior to further refills and the pharmacy is made aware.   After rec'ving message I called and talked with Diane. She confirmed the above. I ask that they hold our Rx until we talk with them.  Patient called and a detailed message was left. Ask that the patient let us know what provider she wanted to prescribe this medication.

## 2016-08-11 NOTE — Telephone Encounter (Signed)
Patient called, stated she is out of Lomotil.  She is requesting a written prescription and she can pick it up on Monday.  [phone]

## 2016-08-11 NOTE — Telephone Encounter (Signed)
This has been called to [facility] in [place].

## 2016-08-11 NOTE — Telephone Encounter (Signed)
To [facility].

## 2016-08-12 ENCOUNTER — Inpatient Hospital Stay
Admission: RE | Admit: 2016-08-12 | Discharge: 2016-08-12 | Disposition: A | Discharge status: Home / Self Care | Payer: Medicare Other | Source: Ambulatory Visit | Attending: Nurse Practitioner | Admitting: Nurse Practitioner

## 2016-08-12 NOTE — Discharge Instructions (Signed)

## 2016-08-26 DIAGNOSIS — M542 Cervicalgia: Secondary | ICD-10-CM | POA: Diagnosis not present

## 2016-08-26 DIAGNOSIS — K219 Gastro-esophageal reflux disease without esophagitis: Secondary | ICD-10-CM | POA: Diagnosis not present

## 2016-08-26 DIAGNOSIS — I10 Essential (primary) hypertension: Secondary | ICD-10-CM | POA: Diagnosis not present

## 2016-08-26 DIAGNOSIS — I2510 Atherosclerotic heart disease of native coronary artery without angina pectoris: Secondary | ICD-10-CM | POA: Diagnosis not present

## 2016-08-26 DIAGNOSIS — R55 Syncope and collapse: Secondary | ICD-10-CM | POA: Diagnosis not present

## 2016-08-26 DIAGNOSIS — G43711 Chronic migraine without aura, intractable, with status migrainosus: Secondary | ICD-10-CM | POA: Diagnosis not present

## 2016-08-26 DIAGNOSIS — M5412 Radiculopathy, cervical region: Secondary | ICD-10-CM | POA: Diagnosis not present

## 2016-08-26 DIAGNOSIS — K589 Irritable bowel syndrome without diarrhea: Secondary | ICD-10-CM | POA: Diagnosis not present

## 2016-08-26 DIAGNOSIS — M545 Low back pain: Secondary | ICD-10-CM | POA: Diagnosis not present

## 2016-08-30 ENCOUNTER — Ambulatory Visit: Payer: Medicare Other | Admitting: Cardiovascular Disease

## 2016-09-01 ENCOUNTER — Other Ambulatory Visit: Payer: Medicare Other

## 2016-09-02 ENCOUNTER — Ambulatory Visit: Payer: Medicare Other | Admitting: Cardiovascular Disease

## 2016-09-21 ENCOUNTER — Other Ambulatory Visit: Payer: Medicare Other

## 2016-09-23 ENCOUNTER — Ambulatory Visit: Payer: Medicare Other | Admitting: Cardiovascular Disease

## 2016-10-06 DIAGNOSIS — M542 Cervicalgia: Secondary | ICD-10-CM | POA: Diagnosis not present

## 2016-10-06 DIAGNOSIS — G43701 Chronic migraine without aura, not intractable, with status migrainosus: Secondary | ICD-10-CM | POA: Diagnosis not present

## 2016-10-06 DIAGNOSIS — I2510 Atherosclerotic heart disease of native coronary artery without angina pectoris: Secondary | ICD-10-CM | POA: Diagnosis not present

## 2016-10-06 DIAGNOSIS — K589 Irritable bowel syndrome without diarrhea: Secondary | ICD-10-CM | POA: Diagnosis not present

## 2016-10-06 DIAGNOSIS — Z79891 Long term (current) use of opiate analgesic: Secondary | ICD-10-CM | POA: Diagnosis not present

## 2016-10-06 DIAGNOSIS — I10 Essential (primary) hypertension: Secondary | ICD-10-CM | POA: Diagnosis not present

## 2016-10-06 DIAGNOSIS — K219 Gastro-esophageal reflux disease without esophagitis: Secondary | ICD-10-CM | POA: Diagnosis not present

## 2016-10-06 DIAGNOSIS — R55 Syncope and collapse: Secondary | ICD-10-CM | POA: Diagnosis not present

## 2016-10-06 DIAGNOSIS — M545 Low back pain: Secondary | ICD-10-CM | POA: Diagnosis not present

## 2016-10-06 DIAGNOSIS — M5412 Radiculopathy, cervical region: Secondary | ICD-10-CM | POA: Diagnosis not present

## 2016-11-03 ENCOUNTER — Other Ambulatory Visit: Payer: Medicare Other

## 2016-11-08 ENCOUNTER — Telehealth: Payer: Self-pay | Admitting: Cardiovascular Disease

## 2016-11-08 NOTE — Telephone Encounter (Signed)
Called to reschedule Echo but echo order has been cancelled.   Please put in another order if test still needed to be scheduled

## 2016-11-08 NOTE — Telephone Encounter (Signed)
Last seen 06-21-16 by Dr. Bronson Ing.  Echo was ordered at that [place].  Has f/u OV scheduled for 11/21/2016.  Do you want her to have Echo prior to her follow up or do you want to reassess since has been 4 months?

## 2016-11-09 ENCOUNTER — Ambulatory Visit (INDEPENDENT_AMBULATORY_CARE_PROVIDER_SITE_OTHER): Payer: Medicare Other | Admitting: Internal Medicine

## 2016-11-09 NOTE — Telephone Encounter (Signed)
Patient notified

## 2016-11-09 NOTE — Telephone Encounter (Signed)
I will reassess at [place]. No need to obtain echo.

## 2016-11-18 ENCOUNTER — Encounter (INDEPENDENT_AMBULATORY_CARE_PROVIDER_SITE_OTHER): Payer: Self-pay | Admitting: Internal Medicine

## 2016-11-21 ENCOUNTER — Ambulatory Visit: Payer: Medicare Other | Admitting: Cardiovascular Disease

## 2016-12-07 ENCOUNTER — Other Ambulatory Visit: Payer: Self-pay

## 2016-12-07 DIAGNOSIS — I714 Abdominal aortic aneurysm, without rupture, unspecified: Secondary | ICD-10-CM

## 2016-12-13 ENCOUNTER — Ambulatory Visit: Payer: Medicare Other | Admitting: Family

## 2016-12-14 DIAGNOSIS — Z79891 Long term (current) use of opiate analgesic: Secondary | ICD-10-CM | POA: Diagnosis not present

## 2016-12-14 DIAGNOSIS — K589 Irritable bowel syndrome without diarrhea: Secondary | ICD-10-CM | POA: Diagnosis not present

## 2016-12-14 DIAGNOSIS — M542 Cervicalgia: Secondary | ICD-10-CM | POA: Diagnosis not present

## 2016-12-14 DIAGNOSIS — M5412 Radiculopathy, cervical region: Secondary | ICD-10-CM | POA: Diagnosis not present

## 2016-12-14 DIAGNOSIS — G43701 Chronic migraine without aura, not intractable, with status migrainosus: Secondary | ICD-10-CM | POA: Diagnosis not present

## 2016-12-14 DIAGNOSIS — R55 Syncope and collapse: Secondary | ICD-10-CM | POA: Diagnosis not present

## 2016-12-14 DIAGNOSIS — M545 Low back pain: Secondary | ICD-10-CM | POA: Diagnosis not present

## 2016-12-14 DIAGNOSIS — I2510 Atherosclerotic heart disease of native coronary artery without angina pectoris: Secondary | ICD-10-CM | POA: Diagnosis not present

## 2016-12-14 DIAGNOSIS — I10 Essential (primary) hypertension: Secondary | ICD-10-CM | POA: Diagnosis not present

## 2016-12-14 DIAGNOSIS — K219 Gastro-esophageal reflux disease without esophagitis: Secondary | ICD-10-CM | POA: Diagnosis not present

## 2016-12-16 ENCOUNTER — Ambulatory Visit: Payer: Medicare Other | Admitting: Cardiovascular Disease

## 2016-12-21 ENCOUNTER — Encounter: Payer: Self-pay | Admitting: Family

## 2016-12-22 ENCOUNTER — Ambulatory Visit: Payer: Medicare Other | Admitting: Family

## 2016-12-22 ENCOUNTER — Other Ambulatory Visit (HOSPITAL_COMMUNITY): Payer: Medicare Other

## 2016-12-24 DIAGNOSIS — I2510 Atherosclerotic heart disease of native coronary artery without angina pectoris: Secondary | ICD-10-CM | POA: Diagnosis not present

## 2016-12-24 DIAGNOSIS — F329 Major depressive disorder, single episode, unspecified: Secondary | ICD-10-CM | POA: Diagnosis present

## 2016-12-24 DIAGNOSIS — E860 Dehydration: Secondary | ICD-10-CM | POA: Diagnosis not present

## 2016-12-24 DIAGNOSIS — R404 Transient alteration of awareness: Secondary | ICD-10-CM | POA: Diagnosis not present

## 2016-12-24 DIAGNOSIS — Z7982 Long term (current) use of aspirin: Secondary | ICD-10-CM | POA: Diagnosis not present

## 2016-12-24 DIAGNOSIS — Z886 Allergy status to analgesic agent status: Secondary | ICD-10-CM | POA: Diagnosis not present

## 2016-12-24 DIAGNOSIS — Z79899 Other long term (current) drug therapy: Secondary | ICD-10-CM | POA: Diagnosis not present

## 2016-12-24 DIAGNOSIS — I10 Essential (primary) hypertension: Secondary | ICD-10-CM | POA: Diagnosis present

## 2016-12-24 DIAGNOSIS — E7800 Pure hypercholesterolemia, unspecified: Secondary | ICD-10-CM | POA: Diagnosis present

## 2016-12-24 DIAGNOSIS — R319 Hematuria, unspecified: Secondary | ICD-10-CM | POA: Diagnosis not present

## 2016-12-24 DIAGNOSIS — I959 Hypotension, unspecified: Secondary | ICD-10-CM | POA: Diagnosis not present

## 2016-12-24 DIAGNOSIS — Z888 Allergy status to other drugs, medicaments and biological substances status: Secondary | ICD-10-CM | POA: Diagnosis not present

## 2016-12-24 DIAGNOSIS — Z7951 Long term (current) use of inhaled steroids: Secondary | ICD-10-CM | POA: Diagnosis not present

## 2016-12-24 DIAGNOSIS — Z881 Allergy status to other antibiotic agents status: Secondary | ICD-10-CM | POA: Diagnosis not present

## 2016-12-24 DIAGNOSIS — Z880 Allergy status to penicillin: Secondary | ICD-10-CM | POA: Diagnosis not present

## 2016-12-24 DIAGNOSIS — R4182 Altered mental status, unspecified: Secondary | ICD-10-CM | POA: Diagnosis not present

## 2016-12-24 DIAGNOSIS — R5383 Other fatigue: Secondary | ICD-10-CM | POA: Diagnosis not present

## 2016-12-24 DIAGNOSIS — G92 Toxic encephalopathy: Secondary | ICD-10-CM | POA: Diagnosis present

## 2016-12-24 DIAGNOSIS — R4781 Slurred speech: Secondary | ICD-10-CM | POA: Diagnosis not present

## 2016-12-24 DIAGNOSIS — A419 Sepsis, unspecified organism: Secondary | ICD-10-CM | POA: Diagnosis not present

## 2016-12-24 DIAGNOSIS — Z955 Presence of coronary angioplasty implant and graft: Secondary | ICD-10-CM | POA: Diagnosis not present

## 2016-12-24 DIAGNOSIS — N390 Urinary tract infection, site not specified: Secondary | ICD-10-CM | POA: Diagnosis not present

## 2016-12-24 DIAGNOSIS — M47896 Other spondylosis, lumbar region: Secondary | ICD-10-CM | POA: Diagnosis present

## 2016-12-24 DIAGNOSIS — R531 Weakness: Secondary | ICD-10-CM | POA: Diagnosis not present

## 2016-12-24 DIAGNOSIS — B9620 Unspecified Escherichia coli [E. coli] as the cause of diseases classified elsewhere: Secondary | ICD-10-CM | POA: Diagnosis present

## 2016-12-24 DIAGNOSIS — F419 Anxiety disorder, unspecified: Secondary | ICD-10-CM | POA: Diagnosis present

## 2016-12-24 DIAGNOSIS — Z66 Do not resuscitate: Secondary | ICD-10-CM | POA: Diagnosis present

## 2016-12-24 DIAGNOSIS — Z853 Personal history of malignant neoplasm of breast: Secondary | ICD-10-CM | POA: Diagnosis not present

## 2016-12-24 DIAGNOSIS — Z882 Allergy status to sulfonamides status: Secondary | ICD-10-CM | POA: Diagnosis not present

## 2016-12-24 DIAGNOSIS — F17200 Nicotine dependence, unspecified, uncomplicated: Secondary | ICD-10-CM | POA: Diagnosis present

## 2016-12-24 DIAGNOSIS — Z7902 Long term (current) use of antithrombotics/antiplatelets: Secondary | ICD-10-CM | POA: Diagnosis not present

## 2016-12-24 DIAGNOSIS — K580 Irritable bowel syndrome with diarrhea: Secondary | ICD-10-CM | POA: Diagnosis present

## 2016-12-28 ENCOUNTER — Other Ambulatory Visit: Payer: Self-pay | Admitting: Neurology

## 2016-12-28 DIAGNOSIS — M62830 Muscle spasm of back: Secondary | ICD-10-CM

## 2017-01-10 ENCOUNTER — Ambulatory Visit: Payer: Medicare Other | Admitting: Cardiovascular Disease

## 2017-01-11 DIAGNOSIS — Z713 Dietary counseling and surveillance: Secondary | ICD-10-CM | POA: Diagnosis not present

## 2017-01-11 DIAGNOSIS — Z09 Encounter for follow-up examination after completed treatment for conditions other than malignant neoplasm: Secondary | ICD-10-CM | POA: Diagnosis not present

## 2017-01-11 DIAGNOSIS — J449 Chronic obstructive pulmonary disease, unspecified: Secondary | ICD-10-CM | POA: Diagnosis not present

## 2017-01-11 DIAGNOSIS — M5412 Radiculopathy, cervical region: Secondary | ICD-10-CM | POA: Diagnosis not present

## 2017-01-11 DIAGNOSIS — M47812 Spondylosis without myelopathy or radiculopathy, cervical region: Secondary | ICD-10-CM | POA: Diagnosis not present

## 2017-01-11 DIAGNOSIS — C50911 Malignant neoplasm of unspecified site of right female breast: Secondary | ICD-10-CM | POA: Diagnosis not present

## 2017-01-11 DIAGNOSIS — M5136 Other intervertebral disc degeneration, lumbar region: Secondary | ICD-10-CM | POA: Diagnosis not present

## 2017-01-11 DIAGNOSIS — I10 Essential (primary) hypertension: Secondary | ICD-10-CM | POA: Diagnosis not present

## 2017-01-11 DIAGNOSIS — N390 Urinary tract infection, site not specified: Secondary | ICD-10-CM | POA: Diagnosis not present

## 2017-01-11 DIAGNOSIS — Z6825 Body mass index (BMI) 25.0-25.9, adult: Secondary | ICD-10-CM | POA: Diagnosis not present

## 2017-01-11 DIAGNOSIS — Z9011 Acquired absence of right breast and nipple: Secondary | ICD-10-CM | POA: Diagnosis not present

## 2017-01-11 DIAGNOSIS — Z299 Encounter for prophylactic measures, unspecified: Secondary | ICD-10-CM | POA: Diagnosis not present

## 2017-01-11 DIAGNOSIS — R51 Headache: Secondary | ICD-10-CM | POA: Diagnosis not present

## 2017-01-23 ENCOUNTER — Encounter: Payer: Self-pay | Admitting: Family

## 2017-01-25 ENCOUNTER — Inpatient Hospital Stay (HOSPITAL_COMMUNITY): Admission: RE | Admit: 2017-01-25 | Payer: Medicare Other | Source: Ambulatory Visit

## 2017-01-25 ENCOUNTER — Ambulatory Visit: Payer: Medicare Other | Admitting: Family

## 2017-01-30 ENCOUNTER — Ambulatory Visit: Payer: Medicare Other | Admitting: Cardiovascular Disease

## 2017-01-31 ENCOUNTER — Ambulatory Visit (INDEPENDENT_AMBULATORY_CARE_PROVIDER_SITE_OTHER): Payer: Medicare Other | Admitting: Internal Medicine

## 2017-02-22 ENCOUNTER — Ambulatory Visit: Payer: Medicare Other | Admitting: Cardiovascular Disease

## 2017-03-02 DIAGNOSIS — M545 Low back pain: Secondary | ICD-10-CM | POA: Diagnosis not present

## 2017-03-02 DIAGNOSIS — Z79891 Long term (current) use of opiate analgesic: Secondary | ICD-10-CM | POA: Diagnosis not present

## 2017-03-02 DIAGNOSIS — G43701 Chronic migraine without aura, not intractable, with status migrainosus: Secondary | ICD-10-CM | POA: Diagnosis not present

## 2017-03-02 DIAGNOSIS — M5412 Radiculopathy, cervical region: Secondary | ICD-10-CM | POA: Diagnosis not present

## 2017-03-09 ENCOUNTER — Encounter: Payer: Self-pay | Admitting: *Deleted

## 2017-03-13 ENCOUNTER — Inpatient Hospital Stay (HOSPITAL_COMMUNITY): Admission: RE | Admit: 2017-03-13 | Payer: Medicare Other | Source: Ambulatory Visit

## 2017-03-13 ENCOUNTER — Ambulatory Visit: Payer: Medicare Other | Admitting: Family

## 2017-03-22 ENCOUNTER — Inpatient Hospital Stay
Admission: RE | Admit: 2017-03-22 | Discharge: 2017-03-22 | Disposition: A | Discharge status: Home / Self Care | Payer: Medicare Other | Source: Ambulatory Visit | Attending: Neurology | Admitting: Neurology

## 2017-03-24 ENCOUNTER — Ambulatory Visit: Payer: Medicare Other | Admitting: Cardiovascular Disease

## 2017-03-27 ENCOUNTER — Encounter: Payer: Self-pay | Admitting: Cardiovascular Disease

## 2017-03-28 DIAGNOSIS — Z87891 Personal history of nicotine dependence: Secondary | ICD-10-CM | POA: Diagnosis not present

## 2017-03-28 DIAGNOSIS — Z79899 Other long term (current) drug therapy: Secondary | ICD-10-CM | POA: Diagnosis not present

## 2017-03-28 DIAGNOSIS — E7800 Pure hypercholesterolemia, unspecified: Secondary | ICD-10-CM | POA: Diagnosis not present

## 2017-03-28 DIAGNOSIS — R079 Chest pain, unspecified: Secondary | ICD-10-CM | POA: Diagnosis not present

## 2017-03-28 DIAGNOSIS — Z7902 Long term (current) use of antithrombotics/antiplatelets: Secondary | ICD-10-CM | POA: Diagnosis not present

## 2017-03-28 DIAGNOSIS — Z853 Personal history of malignant neoplasm of breast: Secondary | ICD-10-CM | POA: Diagnosis not present

## 2017-03-28 DIAGNOSIS — R197 Diarrhea, unspecified: Secondary | ICD-10-CM | POA: Diagnosis not present

## 2017-03-28 DIAGNOSIS — I2510 Atherosclerotic heart disease of native coronary artery without angina pectoris: Secondary | ICD-10-CM | POA: Diagnosis not present

## 2017-03-28 DIAGNOSIS — R110 Nausea: Secondary | ICD-10-CM | POA: Diagnosis not present

## 2017-03-28 DIAGNOSIS — R071 Chest pain on breathing: Secondary | ICD-10-CM | POA: Diagnosis not present

## 2017-03-28 DIAGNOSIS — R0602 Shortness of breath: Secondary | ICD-10-CM | POA: Diagnosis not present

## 2017-03-28 DIAGNOSIS — Z7982 Long term (current) use of aspirin: Secondary | ICD-10-CM | POA: Diagnosis not present

## 2017-03-28 DIAGNOSIS — I10 Essential (primary) hypertension: Secondary | ICD-10-CM | POA: Diagnosis not present

## 2017-04-03 ENCOUNTER — Inpatient Hospital Stay
Admission: RE | Admit: 2017-04-03 | Discharge: 2017-04-03 | Disposition: A | Discharge status: Home / Self Care | Payer: Medicare Other | Source: Ambulatory Visit | Attending: Neurology | Admitting: Neurology

## 2017-04-11 DIAGNOSIS — I2510 Atherosclerotic heart disease of native coronary artery without angina pectoris: Secondary | ICD-10-CM | POA: Diagnosis present

## 2017-04-11 DIAGNOSIS — I10 Essential (primary) hypertension: Secondary | ICD-10-CM | POA: Diagnosis not present

## 2017-04-11 DIAGNOSIS — R51 Headache: Secondary | ICD-10-CM | POA: Diagnosis not present

## 2017-04-11 DIAGNOSIS — R531 Weakness: Secondary | ICD-10-CM | POA: Diagnosis not present

## 2017-04-11 DIAGNOSIS — N179 Acute kidney failure, unspecified: Secondary | ICD-10-CM | POA: Diagnosis not present

## 2017-04-11 DIAGNOSIS — E7800 Pure hypercholesterolemia, unspecified: Secondary | ICD-10-CM | POA: Diagnosis present

## 2017-04-11 DIAGNOSIS — K219 Gastro-esophageal reflux disease without esophagitis: Secondary | ICD-10-CM | POA: Diagnosis not present

## 2017-04-11 DIAGNOSIS — F329 Major depressive disorder, single episode, unspecified: Secondary | ICD-10-CM | POA: Diagnosis not present

## 2017-04-11 DIAGNOSIS — Z881 Allergy status to other antibiotic agents status: Secondary | ICD-10-CM | POA: Diagnosis not present

## 2017-04-11 DIAGNOSIS — G8192 Hemiplegia, unspecified affecting left dominant side: Secondary | ICD-10-CM | POA: Diagnosis not present

## 2017-04-11 DIAGNOSIS — Z79899 Other long term (current) drug therapy: Secondary | ICD-10-CM | POA: Diagnosis not present

## 2017-04-11 DIAGNOSIS — I6789 Other cerebrovascular disease: Secondary | ICD-10-CM | POA: Diagnosis not present

## 2017-04-11 DIAGNOSIS — Z888 Allergy status to other drugs, medicaments and biological substances status: Secondary | ICD-10-CM | POA: Diagnosis not present

## 2017-04-11 DIAGNOSIS — M792 Neuralgia and neuritis, unspecified: Secondary | ICD-10-CM | POA: Diagnosis not present

## 2017-04-11 DIAGNOSIS — Z7902 Long term (current) use of antithrombotics/antiplatelets: Secondary | ICD-10-CM | POA: Diagnosis not present

## 2017-04-11 DIAGNOSIS — I6523 Occlusion and stenosis of bilateral carotid arteries: Secondary | ICD-10-CM | POA: Diagnosis not present

## 2017-04-11 DIAGNOSIS — I69322 Dysarthria following cerebral infarction: Secondary | ICD-10-CM | POA: Diagnosis not present

## 2017-04-11 DIAGNOSIS — M549 Dorsalgia, unspecified: Secondary | ICD-10-CM | POA: Diagnosis present

## 2017-04-11 DIAGNOSIS — K580 Irritable bowel syndrome with diarrhea: Secondary | ICD-10-CM | POA: Diagnosis not present

## 2017-04-11 DIAGNOSIS — Z882 Allergy status to sulfonamides status: Secondary | ICD-10-CM | POA: Diagnosis not present

## 2017-04-11 DIAGNOSIS — G8929 Other chronic pain: Secondary | ICD-10-CM | POA: Diagnosis present

## 2017-04-11 DIAGNOSIS — Z23 Encounter for immunization: Secondary | ICD-10-CM | POA: Diagnosis not present

## 2017-04-11 DIAGNOSIS — G8194 Hemiplegia, unspecified affecting left nondominant side: Secondary | ICD-10-CM | POA: Diagnosis present

## 2017-04-11 DIAGNOSIS — D649 Anemia, unspecified: Secondary | ICD-10-CM | POA: Diagnosis not present

## 2017-04-11 DIAGNOSIS — J309 Allergic rhinitis, unspecified: Secondary | ICD-10-CM | POA: Diagnosis not present

## 2017-04-11 DIAGNOSIS — Z79891 Long term (current) use of opiate analgesic: Secondary | ICD-10-CM | POA: Diagnosis not present

## 2017-04-11 DIAGNOSIS — Z7982 Long term (current) use of aspirin: Secondary | ICD-10-CM | POA: Diagnosis not present

## 2017-04-11 DIAGNOSIS — M5137 Other intervertebral disc degeneration, lumbosacral region: Secondary | ICD-10-CM | POA: Diagnosis not present

## 2017-04-11 DIAGNOSIS — E119 Type 2 diabetes mellitus without complications: Secondary | ICD-10-CM | POA: Diagnosis present

## 2017-04-11 DIAGNOSIS — I6389 Other cerebral infarction: Secondary | ICD-10-CM | POA: Diagnosis present

## 2017-04-11 DIAGNOSIS — R945 Abnormal results of liver function studies: Secondary | ICD-10-CM | POA: Diagnosis not present

## 2017-04-11 DIAGNOSIS — Z9011 Acquired absence of right breast and nipple: Secondary | ICD-10-CM | POA: Diagnosis not present

## 2017-04-11 DIAGNOSIS — Z9109 Other allergy status, other than to drugs and biological substances: Secondary | ICD-10-CM | POA: Diagnosis not present

## 2017-04-11 DIAGNOSIS — I639 Cerebral infarction, unspecified: Secondary | ICD-10-CM | POA: Diagnosis not present

## 2017-04-11 DIAGNOSIS — Z880 Allergy status to penicillin: Secondary | ICD-10-CM | POA: Diagnosis not present

## 2017-04-11 DIAGNOSIS — K589 Irritable bowel syndrome without diarrhea: Secondary | ICD-10-CM | POA: Diagnosis present

## 2017-04-11 DIAGNOSIS — Z955 Presence of coronary angioplasty implant and graft: Secondary | ICD-10-CM | POA: Diagnosis not present

## 2017-04-11 DIAGNOSIS — Z7951 Long term (current) use of inhaled steroids: Secondary | ICD-10-CM | POA: Diagnosis not present

## 2017-04-11 DIAGNOSIS — R4781 Slurred speech: Secondary | ICD-10-CM | POA: Diagnosis not present

## 2017-04-11 DIAGNOSIS — F419 Anxiety disorder, unspecified: Secondary | ICD-10-CM | POA: Diagnosis not present

## 2017-04-11 DIAGNOSIS — F17200 Nicotine dependence, unspecified, uncomplicated: Secondary | ICD-10-CM | POA: Diagnosis present

## 2017-04-11 DIAGNOSIS — Z853 Personal history of malignant neoplasm of breast: Secondary | ICD-10-CM | POA: Diagnosis not present

## 2017-04-14 DIAGNOSIS — I10 Essential (primary) hypertension: Secondary | ICD-10-CM | POA: Diagnosis not present

## 2017-04-14 DIAGNOSIS — F419 Anxiety disorder, unspecified: Secondary | ICD-10-CM | POA: Diagnosis not present

## 2017-04-14 DIAGNOSIS — Z955 Presence of coronary angioplasty implant and graft: Secondary | ICD-10-CM | POA: Diagnosis not present

## 2017-04-14 DIAGNOSIS — I6389 Other cerebral infarction: Secondary | ICD-10-CM | POA: Diagnosis not present

## 2017-04-14 DIAGNOSIS — F17200 Nicotine dependence, unspecified, uncomplicated: Secondary | ICD-10-CM | POA: Diagnosis not present

## 2017-04-14 DIAGNOSIS — I69322 Dysarthria following cerebral infarction: Secondary | ICD-10-CM | POA: Diagnosis not present

## 2017-04-14 DIAGNOSIS — I639 Cerebral infarction, unspecified: Secondary | ICD-10-CM | POA: Diagnosis not present

## 2017-04-14 DIAGNOSIS — N179 Acute kidney failure, unspecified: Secondary | ICD-10-CM | POA: Diagnosis not present

## 2017-04-14 DIAGNOSIS — D649 Anemia, unspecified: Secondary | ICD-10-CM | POA: Diagnosis not present

## 2017-04-14 DIAGNOSIS — K219 Gastro-esophageal reflux disease without esophagitis: Secondary | ICD-10-CM | POA: Diagnosis not present

## 2017-04-14 DIAGNOSIS — G8194 Hemiplegia, unspecified affecting left nondominant side: Secondary | ICD-10-CM | POA: Diagnosis not present

## 2017-04-14 DIAGNOSIS — F329 Major depressive disorder, single episode, unspecified: Secondary | ICD-10-CM | POA: Diagnosis not present

## 2017-04-14 DIAGNOSIS — E7800 Pure hypercholesterolemia, unspecified: Secondary | ICD-10-CM | POA: Diagnosis not present

## 2017-04-14 DIAGNOSIS — M792 Neuralgia and neuritis, unspecified: Secondary | ICD-10-CM | POA: Diagnosis not present

## 2017-04-14 DIAGNOSIS — Z9011 Acquired absence of right breast and nipple: Secondary | ICD-10-CM | POA: Diagnosis not present

## 2017-04-14 DIAGNOSIS — K580 Irritable bowel syndrome with diarrhea: Secondary | ICD-10-CM | POA: Diagnosis not present

## 2017-04-14 DIAGNOSIS — J309 Allergic rhinitis, unspecified: Secondary | ICD-10-CM | POA: Diagnosis not present

## 2017-04-14 DIAGNOSIS — M5137 Other intervertebral disc degeneration, lumbosacral region: Secondary | ICD-10-CM | POA: Diagnosis not present

## 2017-04-14 DIAGNOSIS — Z23 Encounter for immunization: Secondary | ICD-10-CM | POA: Diagnosis not present

## 2017-04-14 DIAGNOSIS — I2510 Atherosclerotic heart disease of native coronary artery without angina pectoris: Secondary | ICD-10-CM | POA: Diagnosis not present

## 2017-04-17 DIAGNOSIS — K580 Irritable bowel syndrome with diarrhea: Secondary | ICD-10-CM | POA: Diagnosis not present

## 2017-04-17 DIAGNOSIS — F419 Anxiety disorder, unspecified: Secondary | ICD-10-CM | POA: Diagnosis not present

## 2017-04-17 DIAGNOSIS — I6389 Other cerebral infarction: Secondary | ICD-10-CM | POA: Diagnosis not present

## 2017-04-17 DIAGNOSIS — F329 Major depressive disorder, single episode, unspecified: Secondary | ICD-10-CM | POA: Diagnosis not present

## 2017-04-17 DIAGNOSIS — I639 Cerebral infarction, unspecified: Secondary | ICD-10-CM | POA: Diagnosis not present

## 2017-04-17 DIAGNOSIS — R41841 Cognitive communication deficit: Secondary | ICD-10-CM | POA: Diagnosis not present

## 2017-04-17 DIAGNOSIS — I2510 Atherosclerotic heart disease of native coronary artery without angina pectoris: Secondary | ICD-10-CM | POA: Diagnosis not present

## 2017-04-17 DIAGNOSIS — R109 Unspecified abdominal pain: Secondary | ICD-10-CM | POA: Diagnosis not present

## 2017-04-17 DIAGNOSIS — I69322 Dysarthria following cerebral infarction: Secondary | ICD-10-CM | POA: Diagnosis not present

## 2017-04-17 DIAGNOSIS — R197 Diarrhea, unspecified: Secondary | ICD-10-CM | POA: Diagnosis not present

## 2017-04-17 DIAGNOSIS — M6281 Muscle weakness (generalized): Secondary | ICD-10-CM | POA: Diagnosis not present

## 2017-04-17 DIAGNOSIS — Z955 Presence of coronary angioplasty implant and graft: Secondary | ICD-10-CM | POA: Diagnosis not present

## 2017-04-17 DIAGNOSIS — R278 Other lack of coordination: Secondary | ICD-10-CM | POA: Diagnosis not present

## 2017-04-17 DIAGNOSIS — I10 Essential (primary) hypertension: Secondary | ICD-10-CM | POA: Diagnosis not present

## 2017-04-17 DIAGNOSIS — G8194 Hemiplegia, unspecified affecting left nondominant side: Secondary | ICD-10-CM | POA: Diagnosis not present

## 2017-04-17 DIAGNOSIS — I69354 Hemiplegia and hemiparesis following cerebral infarction affecting left non-dominant side: Secondary | ICD-10-CM | POA: Diagnosis not present

## 2017-04-17 DIAGNOSIS — K589 Irritable bowel syndrome without diarrhea: Secondary | ICD-10-CM | POA: Diagnosis not present

## 2017-04-17 DIAGNOSIS — R2689 Other abnormalities of gait and mobility: Secondary | ICD-10-CM | POA: Diagnosis not present

## 2017-04-18 DIAGNOSIS — I2510 Atherosclerotic heart disease of native coronary artery without angina pectoris: Secondary | ICD-10-CM | POA: Diagnosis not present

## 2017-04-18 DIAGNOSIS — R109 Unspecified abdominal pain: Secondary | ICD-10-CM | POA: Diagnosis not present

## 2017-04-18 DIAGNOSIS — K589 Irritable bowel syndrome without diarrhea: Secondary | ICD-10-CM | POA: Diagnosis not present

## 2017-04-18 DIAGNOSIS — I639 Cerebral infarction, unspecified: Secondary | ICD-10-CM | POA: Diagnosis not present

## 2017-04-29 DIAGNOSIS — R197 Diarrhea, unspecified: Secondary | ICD-10-CM | POA: Diagnosis not present

## 2017-04-29 DIAGNOSIS — I69354 Hemiplegia and hemiparesis following cerebral infarction affecting left non-dominant side: Secondary | ICD-10-CM | POA: Diagnosis not present

## 2017-04-29 DIAGNOSIS — F329 Major depressive disorder, single episode, unspecified: Secondary | ICD-10-CM | POA: Diagnosis not present

## 2017-04-29 DIAGNOSIS — F419 Anxiety disorder, unspecified: Secondary | ICD-10-CM | POA: Diagnosis not present

## 2017-05-23 DIAGNOSIS — M545 Low back pain: Secondary | ICD-10-CM | POA: Diagnosis not present

## 2017-05-23 DIAGNOSIS — I6789 Other cerebrovascular disease: Secondary | ICD-10-CM | POA: Diagnosis not present

## 2017-05-23 DIAGNOSIS — Z79891 Long term (current) use of opiate analgesic: Secondary | ICD-10-CM | POA: Diagnosis not present

## 2017-05-23 DIAGNOSIS — M542 Cervicalgia: Secondary | ICD-10-CM | POA: Diagnosis not present

## 2017-06-02 ENCOUNTER — Telehealth: Payer: Self-pay | Admitting: *Deleted

## 2017-06-02 MED ORDER — LOSARTAN POTASSIUM 50 MG PO TABS: 100.0000 mg | ORAL_TABLET | Freq: Every day | ORAL | 6 refills | Status: AC

## 2017-06-02 NOTE — Telephone Encounter (Signed)
Noted. Will send new rx to pharmacy.

## 2017-06-02 NOTE — Telephone Encounter (Signed)
Patient currently taking Valsartan 320mg  daily - [facility] no longer able to get this medication in.  [employer] recall.  This medication, as well as Losartan on a back order.  Right now the only things she has in stock is the plain Losartan 50mg  and 25mg .  Please advise on change of medication for this patient.

## 2017-06-02 NOTE — Telephone Encounter (Signed)
"  Plain" losartan 100 mg daily.

## 2017-06-05 ENCOUNTER — Telehealth: Payer: Self-pay | Admitting: Cardiovascular Disease

## 2017-06-05 NOTE — Telephone Encounter (Signed)
Patient dismissed from [facility] by Kate Sable MD , effective March 27, 2017. Dismissal letter sent out by certified / registered mail.  daj

## 2017-06-12 NOTE — Telephone Encounter (Signed)
Received signed domestic return receipt verifying delivery of certified letter on June 08, 2017. Article number [id number] [phone] [id number]

## 2017-06-13 ENCOUNTER — Ambulatory Visit (INDEPENDENT_AMBULATORY_CARE_PROVIDER_SITE_OTHER): Payer: Medicare Other | Admitting: Internal Medicine

## 2017-07-11 DIAGNOSIS — I69352 Hemiplegia and hemiparesis following cerebral infarction affecting left dominant side: Secondary | ICD-10-CM | POA: Diagnosis not present

## 2017-07-11 DIAGNOSIS — M542 Cervicalgia: Secondary | ICD-10-CM | POA: Diagnosis not present

## 2017-07-11 DIAGNOSIS — M545 Low back pain: Secondary | ICD-10-CM | POA: Diagnosis not present

## 2017-07-11 DIAGNOSIS — I6789 Other cerebrovascular disease: Secondary | ICD-10-CM | POA: Diagnosis not present

## 2017-07-11 DIAGNOSIS — R296 Repeated falls: Secondary | ICD-10-CM | POA: Diagnosis not present

## 2017-07-11 DIAGNOSIS — Z79891 Long term (current) use of opiate analgesic: Secondary | ICD-10-CM | POA: Diagnosis not present

## 2017-07-14 DIAGNOSIS — I639 Cerebral infarction, unspecified: Secondary | ICD-10-CM | POA: Diagnosis not present

## 2017-07-14 DIAGNOSIS — C50911 Malignant neoplasm of unspecified site of right female breast: Secondary | ICD-10-CM | POA: Diagnosis not present

## 2017-07-14 DIAGNOSIS — Z6825 Body mass index (BMI) 25.0-25.9, adult: Secondary | ICD-10-CM | POA: Diagnosis not present

## 2017-07-14 DIAGNOSIS — Z299 Encounter for prophylactic measures, unspecified: Secondary | ICD-10-CM | POA: Diagnosis not present

## 2017-07-14 DIAGNOSIS — I10 Essential (primary) hypertension: Secondary | ICD-10-CM | POA: Diagnosis not present

## 2017-07-14 DIAGNOSIS — J449 Chronic obstructive pulmonary disease, unspecified: Secondary | ICD-10-CM | POA: Diagnosis not present

## 2017-07-17 ENCOUNTER — Ambulatory Visit (INDEPENDENT_AMBULATORY_CARE_PROVIDER_SITE_OTHER): Payer: Medicare Other | Admitting: Internal Medicine

## 2017-07-27 DIAGNOSIS — Z6824 Body mass index (BMI) 24.0-24.9, adult: Secondary | ICD-10-CM | POA: Diagnosis not present

## 2017-07-27 DIAGNOSIS — K219 Gastro-esophageal reflux disease without esophagitis: Secondary | ICD-10-CM | POA: Diagnosis not present

## 2017-07-27 DIAGNOSIS — Z299 Encounter for prophylactic measures, unspecified: Secondary | ICD-10-CM | POA: Diagnosis not present

## 2017-07-27 DIAGNOSIS — I639 Cerebral infarction, unspecified: Secondary | ICD-10-CM | POA: Diagnosis not present

## 2017-07-27 DIAGNOSIS — K589 Irritable bowel syndrome without diarrhea: Secondary | ICD-10-CM | POA: Diagnosis not present

## 2017-07-27 DIAGNOSIS — I2510 Atherosclerotic heart disease of native coronary artery without angina pectoris: Secondary | ICD-10-CM | POA: Diagnosis not present

## 2017-07-27 DIAGNOSIS — J449 Chronic obstructive pulmonary disease, unspecified: Secondary | ICD-10-CM | POA: Diagnosis not present

## 2017-07-27 DIAGNOSIS — I10 Essential (primary) hypertension: Secondary | ICD-10-CM | POA: Diagnosis not present

## 2017-10-04 DIAGNOSIS — I6789 Other cerebrovascular disease: Secondary | ICD-10-CM | POA: Diagnosis not present

## 2017-10-04 DIAGNOSIS — R296 Repeated falls: Secondary | ICD-10-CM | POA: Diagnosis not present

## 2017-10-04 DIAGNOSIS — M542 Cervicalgia: Secondary | ICD-10-CM | POA: Diagnosis not present

## 2017-10-04 DIAGNOSIS — I69352 Hemiplegia and hemiparesis following cerebral infarction affecting left dominant side: Secondary | ICD-10-CM | POA: Diagnosis not present

## 2017-10-16 DIAGNOSIS — R112 Nausea with vomiting, unspecified: Secondary | ICD-10-CM | POA: Diagnosis not present

## 2017-10-16 DIAGNOSIS — R239 Unspecified skin changes: Secondary | ICD-10-CM | POA: Diagnosis not present

## 2017-10-16 DIAGNOSIS — R42 Dizziness and giddiness: Secondary | ICD-10-CM | POA: Diagnosis not present

## 2017-10-16 DIAGNOSIS — E869 Volume depletion, unspecified: Secondary | ICD-10-CM | POA: Diagnosis not present

## 2017-10-16 DIAGNOSIS — R109 Unspecified abdominal pain: Secondary | ICD-10-CM | POA: Diagnosis not present

## 2017-10-16 DIAGNOSIS — I2510 Atherosclerotic heart disease of native coronary artery without angina pectoris: Secondary | ICD-10-CM | POA: Diagnosis not present

## 2017-10-16 DIAGNOSIS — E860 Dehydration: Secondary | ICD-10-CM | POA: Diagnosis not present

## 2017-10-16 DIAGNOSIS — R000 Tachycardia, unspecified: Secondary | ICD-10-CM | POA: Diagnosis not present

## 2017-10-16 DIAGNOSIS — A09 Infectious gastroenteritis and colitis, unspecified: Secondary | ICD-10-CM | POA: Diagnosis not present

## 2017-10-16 DIAGNOSIS — R197 Diarrhea, unspecified: Secondary | ICD-10-CM | POA: Diagnosis not present

## 2017-10-16 DIAGNOSIS — I10 Essential (primary) hypertension: Secondary | ICD-10-CM | POA: Diagnosis not present

## 2017-10-17 DIAGNOSIS — Z880 Allergy status to penicillin: Secondary | ICD-10-CM | POA: Diagnosis not present

## 2017-10-17 DIAGNOSIS — F329 Major depressive disorder, single episode, unspecified: Secondary | ICD-10-CM | POA: Diagnosis present

## 2017-10-17 DIAGNOSIS — Z452 Encounter for adjustment and management of vascular access device: Secondary | ICD-10-CM | POA: Diagnosis not present

## 2017-10-17 DIAGNOSIS — K589 Irritable bowel syndrome without diarrhea: Secondary | ICD-10-CM | POA: Diagnosis present

## 2017-10-17 DIAGNOSIS — Z87891 Personal history of nicotine dependence: Secondary | ICD-10-CM | POA: Diagnosis not present

## 2017-10-17 DIAGNOSIS — E860 Dehydration: Secondary | ICD-10-CM | POA: Diagnosis present

## 2017-10-17 DIAGNOSIS — R000 Tachycardia, unspecified: Secondary | ICD-10-CM | POA: Diagnosis present

## 2017-10-17 DIAGNOSIS — E869 Volume depletion, unspecified: Secondary | ICD-10-CM | POA: Diagnosis present

## 2017-10-17 DIAGNOSIS — Z7902 Long term (current) use of antithrombotics/antiplatelets: Secondary | ICD-10-CM | POA: Diagnosis not present

## 2017-10-17 DIAGNOSIS — R197 Diarrhea, unspecified: Secondary | ICD-10-CM | POA: Diagnosis not present

## 2017-10-17 DIAGNOSIS — J449 Chronic obstructive pulmonary disease, unspecified: Secondary | ICD-10-CM | POA: Diagnosis present

## 2017-10-17 DIAGNOSIS — G8929 Other chronic pain: Secondary | ICD-10-CM | POA: Diagnosis present

## 2017-10-17 DIAGNOSIS — Z9119 Patient's noncompliance with other medical treatment and regimen: Secondary | ICD-10-CM | POA: Diagnosis not present

## 2017-10-17 DIAGNOSIS — F419 Anxiety disorder, unspecified: Secondary | ICD-10-CM | POA: Diagnosis present

## 2017-10-17 DIAGNOSIS — E7800 Pure hypercholesterolemia, unspecified: Secondary | ICD-10-CM | POA: Diagnosis present

## 2017-10-17 DIAGNOSIS — K219 Gastro-esophageal reflux disease without esophagitis: Secondary | ICD-10-CM | POA: Diagnosis present

## 2017-10-17 DIAGNOSIS — Z4689 Encounter for fitting and adjustment of other specified devices: Secondary | ICD-10-CM | POA: Diagnosis not present

## 2017-10-17 DIAGNOSIS — Z7982 Long term (current) use of aspirin: Secondary | ICD-10-CM | POA: Diagnosis not present

## 2017-10-17 DIAGNOSIS — M47812 Spondylosis without myelopathy or radiculopathy, cervical region: Secondary | ICD-10-CM | POA: Diagnosis present

## 2017-10-17 DIAGNOSIS — I2510 Atherosclerotic heart disease of native coronary artery without angina pectoris: Secondary | ICD-10-CM | POA: Diagnosis present

## 2017-10-17 DIAGNOSIS — Z955 Presence of coronary angioplasty implant and graft: Secondary | ICD-10-CM | POA: Diagnosis not present

## 2017-10-17 DIAGNOSIS — A09 Infectious gastroenteritis and colitis, unspecified: Secondary | ICD-10-CM | POA: Diagnosis present

## 2017-10-17 DIAGNOSIS — I10 Essential (primary) hypertension: Secondary | ICD-10-CM | POA: Diagnosis present

## 2017-10-27 DIAGNOSIS — Z09 Encounter for follow-up examination after completed treatment for conditions other than malignant neoplasm: Secondary | ICD-10-CM | POA: Diagnosis not present

## 2017-10-27 DIAGNOSIS — I2510 Atherosclerotic heart disease of native coronary artery without angina pectoris: Secondary | ICD-10-CM | POA: Diagnosis not present

## 2017-10-27 DIAGNOSIS — E7800 Pure hypercholesterolemia, unspecified: Secondary | ICD-10-CM | POA: Diagnosis not present

## 2017-10-27 DIAGNOSIS — J449 Chronic obstructive pulmonary disease, unspecified: Secondary | ICD-10-CM | POA: Diagnosis not present

## 2017-10-27 DIAGNOSIS — Z299 Encounter for prophylactic measures, unspecified: Secondary | ICD-10-CM | POA: Diagnosis not present

## 2017-10-27 DIAGNOSIS — Z6824 Body mass index (BMI) 24.0-24.9, adult: Secondary | ICD-10-CM | POA: Diagnosis not present

## 2017-10-27 DIAGNOSIS — K529 Noninfective gastroenteritis and colitis, unspecified: Secondary | ICD-10-CM | POA: Diagnosis not present

## 2017-10-27 DIAGNOSIS — I10 Essential (primary) hypertension: Secondary | ICD-10-CM | POA: Diagnosis not present

## 2017-11-30 DIAGNOSIS — M542 Cervicalgia: Secondary | ICD-10-CM | POA: Diagnosis not present

## 2017-11-30 DIAGNOSIS — I69352 Hemiplegia and hemiparesis following cerebral infarction affecting left dominant side: Secondary | ICD-10-CM | POA: Diagnosis not present

## 2017-11-30 DIAGNOSIS — Z79891 Long term (current) use of opiate analgesic: Secondary | ICD-10-CM | POA: Diagnosis not present

## 2017-11-30 DIAGNOSIS — I6789 Other cerebrovascular disease: Secondary | ICD-10-CM | POA: Diagnosis not present

## 2017-11-30 DIAGNOSIS — R296 Repeated falls: Secondary | ICD-10-CM | POA: Diagnosis not present

## 2017-11-30 DIAGNOSIS — M545 Low back pain: Secondary | ICD-10-CM | POA: Diagnosis not present

## 2017-12-11 ENCOUNTER — Other Ambulatory Visit: Payer: Self-pay

## 2017-12-11 DIAGNOSIS — I714 Abdominal aortic aneurysm, without rupture, unspecified: Secondary | ICD-10-CM

## 2017-12-12 DIAGNOSIS — Z299 Encounter for prophylactic measures, unspecified: Secondary | ICD-10-CM | POA: Diagnosis not present

## 2017-12-12 DIAGNOSIS — R5383 Other fatigue: Secondary | ICD-10-CM | POA: Diagnosis not present

## 2017-12-12 DIAGNOSIS — Z79899 Other long term (current) drug therapy: Secondary | ICD-10-CM | POA: Diagnosis not present

## 2017-12-12 DIAGNOSIS — Z6824 Body mass index (BMI) 24.0-24.9, adult: Secondary | ICD-10-CM | POA: Diagnosis not present

## 2017-12-12 DIAGNOSIS — J449 Chronic obstructive pulmonary disease, unspecified: Secondary | ICD-10-CM | POA: Diagnosis not present

## 2017-12-12 DIAGNOSIS — E860 Dehydration: Secondary | ICD-10-CM | POA: Diagnosis not present

## 2017-12-28 DIAGNOSIS — Z79899 Other long term (current) drug therapy: Secondary | ICD-10-CM | POA: Diagnosis not present

## 2018-01-09 ENCOUNTER — Other Ambulatory Visit (HOSPITAL_COMMUNITY): Payer: Medicare Other

## 2018-01-09 ENCOUNTER — Ambulatory Visit: Payer: Medicare Other | Admitting: Vascular Surgery

## 2018-01-30 ENCOUNTER — Other Ambulatory Visit (HOSPITAL_COMMUNITY): Payer: Medicare Other

## 2018-01-30 ENCOUNTER — Ambulatory Visit: Payer: Medicare Other | Admitting: Vascular Surgery

## 2018-02-06 DIAGNOSIS — Z299 Encounter for prophylactic measures, unspecified: Secondary | ICD-10-CM | POA: Diagnosis not present

## 2018-02-06 DIAGNOSIS — R319 Hematuria, unspecified: Secondary | ICD-10-CM | POA: Diagnosis not present

## 2018-02-06 DIAGNOSIS — N390 Urinary tract infection, site not specified: Secondary | ICD-10-CM | POA: Diagnosis not present

## 2018-02-06 DIAGNOSIS — Z6823 Body mass index (BMI) 23.0-23.9, adult: Secondary | ICD-10-CM | POA: Diagnosis not present

## 2018-02-06 DIAGNOSIS — I10 Essential (primary) hypertension: Secondary | ICD-10-CM | POA: Diagnosis not present

## 2018-02-12 DIAGNOSIS — I69352 Hemiplegia and hemiparesis following cerebral infarction affecting left dominant side: Secondary | ICD-10-CM | POA: Diagnosis not present

## 2018-02-12 DIAGNOSIS — R296 Repeated falls: Secondary | ICD-10-CM | POA: Diagnosis not present

## 2018-02-12 DIAGNOSIS — I6789 Other cerebrovascular disease: Secondary | ICD-10-CM | POA: Diagnosis not present

## 2018-02-12 DIAGNOSIS — M542 Cervicalgia: Secondary | ICD-10-CM | POA: Diagnosis not present

## 2018-02-13 ENCOUNTER — Inpatient Hospital Stay (HOSPITAL_COMMUNITY): Admission: RE | Admit: 2018-02-13 | Payer: Medicare Other | Source: Ambulatory Visit

## 2018-02-13 ENCOUNTER — Ambulatory Visit: Payer: Medicare Other | Admitting: Vascular Surgery

## 2018-02-13 ENCOUNTER — Encounter: Payer: Self-pay | Admitting: Vascular Surgery

## 2018-02-27 ENCOUNTER — Ambulatory Visit: Payer: Medicare Other | Admitting: Vascular Surgery

## 2018-02-27 ENCOUNTER — Ambulatory Visit (HOSPITAL_COMMUNITY): Admission: RE | Admit: 2018-02-27 | Payer: Medicare Other | Source: Ambulatory Visit

## 2018-04-17 ENCOUNTER — Other Ambulatory Visit (HOSPITAL_COMMUNITY): Payer: Medicare Other

## 2018-04-17 ENCOUNTER — Ambulatory Visit: Payer: Medicare Other | Admitting: Vascular Surgery

## 2018-05-02 DIAGNOSIS — R918 Other nonspecific abnormal finding of lung field: Secondary | ICD-10-CM | POA: Diagnosis not present

## 2018-05-02 DIAGNOSIS — Z853 Personal history of malignant neoplasm of breast: Secondary | ICD-10-CM | POA: Diagnosis not present

## 2018-05-02 DIAGNOSIS — Z87891 Personal history of nicotine dependence: Secondary | ICD-10-CM | POA: Diagnosis not present

## 2018-05-02 DIAGNOSIS — I129 Hypertensive chronic kidney disease with stage 1 through stage 4 chronic kidney disease, or unspecified chronic kidney disease: Secondary | ICD-10-CM | POA: Diagnosis present

## 2018-05-02 DIAGNOSIS — M47892 Other spondylosis, cervical region: Secondary | ICD-10-CM | POA: Diagnosis present

## 2018-05-02 DIAGNOSIS — J449 Chronic obstructive pulmonary disease, unspecified: Secondary | ICD-10-CM | POA: Diagnosis present

## 2018-05-02 DIAGNOSIS — R52 Pain, unspecified: Secondary | ICD-10-CM | POA: Diagnosis not present

## 2018-05-02 DIAGNOSIS — I2510 Atherosclerotic heart disease of native coronary artery without angina pectoris: Secondary | ICD-10-CM | POA: Diagnosis not present

## 2018-05-02 DIAGNOSIS — S0093XA Contusion of unspecified part of head, initial encounter: Secondary | ICD-10-CM | POA: Diagnosis not present

## 2018-05-02 DIAGNOSIS — M25521 Pain in right elbow: Secondary | ICD-10-CM | POA: Diagnosis not present

## 2018-05-02 DIAGNOSIS — N179 Acute kidney failure, unspecified: Secondary | ICD-10-CM | POA: Diagnosis not present

## 2018-05-02 DIAGNOSIS — E7800 Pure hypercholesterolemia, unspecified: Secondary | ICD-10-CM | POA: Diagnosis present

## 2018-05-02 DIAGNOSIS — R531 Weakness: Secondary | ICD-10-CM | POA: Diagnosis not present

## 2018-05-02 DIAGNOSIS — R55 Syncope and collapse: Secondary | ICD-10-CM | POA: Diagnosis not present

## 2018-05-02 DIAGNOSIS — R0902 Hypoxemia: Secondary | ICD-10-CM | POA: Diagnosis not present

## 2018-05-02 DIAGNOSIS — Z7982 Long term (current) use of aspirin: Secondary | ICD-10-CM | POA: Diagnosis not present

## 2018-05-02 DIAGNOSIS — S199XXA Unspecified injury of neck, initial encounter: Secondary | ICD-10-CM | POA: Diagnosis not present

## 2018-05-02 DIAGNOSIS — M5489 Other dorsalgia: Secondary | ICD-10-CM | POA: Diagnosis not present

## 2018-05-02 DIAGNOSIS — E860 Dehydration: Secondary | ICD-10-CM | POA: Diagnosis not present

## 2018-05-02 DIAGNOSIS — Z7902 Long term (current) use of antithrombotics/antiplatelets: Secondary | ICD-10-CM | POA: Diagnosis not present

## 2018-05-02 DIAGNOSIS — D638 Anemia in other chronic diseases classified elsewhere: Secondary | ICD-10-CM | POA: Diagnosis present

## 2018-05-02 DIAGNOSIS — W06XXXA Fall from bed, initial encounter: Secondary | ICD-10-CM | POA: Diagnosis not present

## 2018-05-02 DIAGNOSIS — N189 Chronic kidney disease, unspecified: Secondary | ICD-10-CM | POA: Diagnosis present

## 2018-05-02 DIAGNOSIS — S59901A Unspecified injury of right elbow, initial encounter: Secondary | ICD-10-CM | POA: Diagnosis not present

## 2018-05-02 DIAGNOSIS — R0689 Other abnormalities of breathing: Secondary | ICD-10-CM | POA: Diagnosis not present

## 2018-05-02 DIAGNOSIS — Z79899 Other long term (current) drug therapy: Secondary | ICD-10-CM | POA: Diagnosis not present

## 2018-05-02 DIAGNOSIS — S0990XA Unspecified injury of head, initial encounter: Secondary | ICD-10-CM | POA: Diagnosis not present

## 2018-06-05 ENCOUNTER — Ambulatory Visit: Payer: Medicare Other | Admitting: Vascular Surgery

## 2018-06-05 ENCOUNTER — Other Ambulatory Visit (HOSPITAL_COMMUNITY): Payer: Medicare Other

## 2018-06-05 ENCOUNTER — Encounter: Payer: Self-pay | Admitting: Vascular Surgery

## 2018-08-01 ENCOUNTER — Telehealth: Payer: Self-pay | Admitting: *Deleted

## 2018-08-01 NOTE — Telephone Encounter (Signed)
Patient called C/O "I had a stroke last week". Speech is NOT clear. She states she knows she has "blockages" and "Arm is numb". I told her she needs emergent work-up and care and to go to the [facility] ER due to  the symptoms she described. Her brother is on the way to drive her.

## 2018-08-04 DIAGNOSIS — Z87891 Personal history of nicotine dependence: Secondary | ICD-10-CM | POA: Diagnosis not present

## 2018-08-04 DIAGNOSIS — G8190 Hemiplegia, unspecified affecting unspecified side: Secondary | ICD-10-CM | POA: Diagnosis not present

## 2018-08-04 DIAGNOSIS — Z7902 Long term (current) use of antithrombotics/antiplatelets: Secondary | ICD-10-CM | POA: Diagnosis not present

## 2018-08-04 DIAGNOSIS — G459 Transient cerebral ischemic attack, unspecified: Secondary | ICD-10-CM | POA: Diagnosis not present

## 2018-08-04 DIAGNOSIS — Z880 Allergy status to penicillin: Secondary | ICD-10-CM | POA: Diagnosis not present

## 2018-08-04 DIAGNOSIS — F329 Major depressive disorder, single episode, unspecified: Secondary | ICD-10-CM | POA: Diagnosis not present

## 2018-08-04 DIAGNOSIS — N179 Acute kidney failure, unspecified: Secondary | ICD-10-CM | POA: Diagnosis not present

## 2018-08-04 DIAGNOSIS — I10 Essential (primary) hypertension: Secondary | ICD-10-CM | POA: Diagnosis not present

## 2018-08-04 DIAGNOSIS — Z8673 Personal history of transient ischemic attack (TIA), and cerebral infarction without residual deficits: Secondary | ICD-10-CM | POA: Diagnosis not present

## 2018-08-04 DIAGNOSIS — R531 Weakness: Secondary | ICD-10-CM | POA: Diagnosis not present

## 2018-08-04 DIAGNOSIS — W19XXXA Unspecified fall, initial encounter: Secondary | ICD-10-CM | POA: Diagnosis not present

## 2018-08-04 DIAGNOSIS — Z853 Personal history of malignant neoplasm of breast: Secondary | ICD-10-CM | POA: Diagnosis not present

## 2018-08-04 DIAGNOSIS — K219 Gastro-esophageal reflux disease without esophagitis: Secondary | ICD-10-CM | POA: Diagnosis not present

## 2018-08-04 DIAGNOSIS — Z9011 Acquired absence of right breast and nipple: Secondary | ICD-10-CM | POA: Diagnosis not present

## 2018-08-04 DIAGNOSIS — Z886 Allergy status to analgesic agent status: Secondary | ICD-10-CM | POA: Diagnosis not present

## 2018-08-04 DIAGNOSIS — M1990 Unspecified osteoarthritis, unspecified site: Secondary | ICD-10-CM | POA: Diagnosis not present

## 2018-08-04 DIAGNOSIS — R55 Syncope and collapse: Secondary | ICD-10-CM | POA: Diagnosis not present

## 2018-08-04 DIAGNOSIS — E785 Hyperlipidemia, unspecified: Secondary | ICD-10-CM | POA: Diagnosis not present

## 2018-08-04 DIAGNOSIS — N189 Chronic kidney disease, unspecified: Secondary | ICD-10-CM | POA: Diagnosis not present

## 2018-08-04 DIAGNOSIS — M7989 Other specified soft tissue disorders: Secondary | ICD-10-CM | POA: Diagnosis not present

## 2018-08-04 DIAGNOSIS — Z7982 Long term (current) use of aspirin: Secondary | ICD-10-CM | POA: Diagnosis not present

## 2018-08-04 DIAGNOSIS — R079 Chest pain, unspecified: Secondary | ICD-10-CM | POA: Diagnosis not present

## 2018-08-04 DIAGNOSIS — Z888 Allergy status to other drugs, medicaments and biological substances status: Secondary | ICD-10-CM | POA: Diagnosis not present

## 2018-08-04 DIAGNOSIS — Z955 Presence of coronary angioplasty implant and graft: Secondary | ICD-10-CM | POA: Diagnosis not present

## 2018-08-04 DIAGNOSIS — Z79899 Other long term (current) drug therapy: Secondary | ICD-10-CM | POA: Diagnosis not present

## 2018-08-04 DIAGNOSIS — J449 Chronic obstructive pulmonary disease, unspecified: Secondary | ICD-10-CM | POA: Diagnosis not present

## 2018-08-04 DIAGNOSIS — R42 Dizziness and giddiness: Secondary | ICD-10-CM | POA: Diagnosis not present

## 2018-08-04 DIAGNOSIS — R0902 Hypoxemia: Secondary | ICD-10-CM | POA: Diagnosis not present

## 2018-08-04 DIAGNOSIS — K589 Irritable bowel syndrome without diarrhea: Secondary | ICD-10-CM | POA: Diagnosis not present

## 2018-08-04 DIAGNOSIS — R29898 Other symptoms and signs involving the musculoskeletal system: Secondary | ICD-10-CM | POA: Diagnosis not present

## 2018-08-04 DIAGNOSIS — Z9119 Patient's noncompliance with other medical treatment and regimen: Secondary | ICD-10-CM | POA: Diagnosis not present

## 2018-08-04 DIAGNOSIS — Z9181 History of falling: Secondary | ICD-10-CM | POA: Diagnosis not present

## 2018-08-04 DIAGNOSIS — I2510 Atherosclerotic heart disease of native coronary artery without angina pectoris: Secondary | ICD-10-CM | POA: Diagnosis not present

## 2018-08-05 DIAGNOSIS — Z9181 History of falling: Secondary | ICD-10-CM | POA: Diagnosis not present

## 2018-08-05 DIAGNOSIS — G459 Transient cerebral ischemic attack, unspecified: Secondary | ICD-10-CM | POA: Diagnosis not present

## 2018-08-05 DIAGNOSIS — N179 Acute kidney failure, unspecified: Secondary | ICD-10-CM | POA: Diagnosis not present

## 2018-08-05 DIAGNOSIS — R42 Dizziness and giddiness: Secondary | ICD-10-CM | POA: Diagnosis not present

## 2018-08-06 DIAGNOSIS — N179 Acute kidney failure, unspecified: Secondary | ICD-10-CM | POA: Diagnosis not present

## 2018-08-06 DIAGNOSIS — G459 Transient cerebral ischemic attack, unspecified: Secondary | ICD-10-CM | POA: Diagnosis not present

## 2018-08-06 DIAGNOSIS — R42 Dizziness and giddiness: Secondary | ICD-10-CM | POA: Diagnosis not present

## 2018-08-06 DIAGNOSIS — Z9181 History of falling: Secondary | ICD-10-CM | POA: Diagnosis not present

## 2018-08-08 DIAGNOSIS — I69352 Hemiplegia and hemiparesis following cerebral infarction affecting left dominant side: Secondary | ICD-10-CM | POA: Diagnosis not present

## 2018-08-08 DIAGNOSIS — M542 Cervicalgia: Secondary | ICD-10-CM | POA: Diagnosis not present

## 2018-08-08 DIAGNOSIS — I6789 Other cerebrovascular disease: Secondary | ICD-10-CM | POA: Diagnosis not present

## 2018-08-08 DIAGNOSIS — Z79891 Long term (current) use of opiate analgesic: Secondary | ICD-10-CM | POA: Diagnosis not present

## 2018-08-08 DIAGNOSIS — R296 Repeated falls: Secondary | ICD-10-CM | POA: Diagnosis not present

## 2018-08-08 DIAGNOSIS — M545 Low back pain: Secondary | ICD-10-CM | POA: Diagnosis not present

## 2018-08-28 DIAGNOSIS — R5383 Other fatigue: Secondary | ICD-10-CM | POA: Diagnosis not present

## 2018-08-28 DIAGNOSIS — I10 Essential (primary) hypertension: Secondary | ICD-10-CM | POA: Diagnosis not present

## 2018-08-28 DIAGNOSIS — Z79899 Other long term (current) drug therapy: Secondary | ICD-10-CM | POA: Diagnosis not present

## 2018-08-28 DIAGNOSIS — R296 Repeated falls: Secondary | ICD-10-CM | POA: Diagnosis not present

## 2018-08-28 DIAGNOSIS — Z299 Encounter for prophylactic measures, unspecified: Secondary | ICD-10-CM | POA: Diagnosis not present

## 2018-08-28 DIAGNOSIS — R269 Unspecified abnormalities of gait and mobility: Secondary | ICD-10-CM | POA: Diagnosis not present

## 2018-08-29 DIAGNOSIS — Z87891 Personal history of nicotine dependence: Secondary | ICD-10-CM | POA: Diagnosis not present

## 2018-08-29 DIAGNOSIS — N184 Chronic kidney disease, stage 4 (severe): Secondary | ICD-10-CM | POA: Diagnosis not present

## 2018-08-29 DIAGNOSIS — I2510 Atherosclerotic heart disease of native coronary artery without angina pectoris: Secondary | ICD-10-CM | POA: Diagnosis not present

## 2018-08-29 DIAGNOSIS — Z853 Personal history of malignant neoplasm of breast: Secondary | ICD-10-CM | POA: Diagnosis not present

## 2018-08-29 DIAGNOSIS — Z955 Presence of coronary angioplasty implant and graft: Secondary | ICD-10-CM | POA: Diagnosis not present

## 2018-08-29 DIAGNOSIS — Z79899 Other long term (current) drug therapy: Secondary | ICD-10-CM | POA: Diagnosis not present

## 2018-08-29 DIAGNOSIS — Z7951 Long term (current) use of inhaled steroids: Secondary | ICD-10-CM | POA: Diagnosis not present

## 2018-08-29 DIAGNOSIS — E7800 Pure hypercholesterolemia, unspecified: Secondary | ICD-10-CM | POA: Diagnosis not present

## 2018-08-29 DIAGNOSIS — Z9011 Acquired absence of right breast and nipple: Secondary | ICD-10-CM | POA: Diagnosis not present

## 2018-08-29 DIAGNOSIS — I129 Hypertensive chronic kidney disease with stage 1 through stage 4 chronic kidney disease, or unspecified chronic kidney disease: Secondary | ICD-10-CM | POA: Diagnosis not present

## 2018-09-15 DIAGNOSIS — J449 Chronic obstructive pulmonary disease, unspecified: Secondary | ICD-10-CM | POA: Diagnosis present

## 2018-09-15 DIAGNOSIS — Z853 Personal history of malignant neoplasm of breast: Secondary | ICD-10-CM | POA: Diagnosis not present

## 2018-09-15 DIAGNOSIS — R531 Weakness: Secondary | ICD-10-CM | POA: Diagnosis not present

## 2018-09-15 DIAGNOSIS — R197 Diarrhea, unspecified: Secondary | ICD-10-CM | POA: Diagnosis not present

## 2018-09-15 DIAGNOSIS — R0689 Other abnormalities of breathing: Secondary | ICD-10-CM | POA: Diagnosis not present

## 2018-09-15 DIAGNOSIS — Z7902 Long term (current) use of antithrombotics/antiplatelets: Secondary | ICD-10-CM | POA: Diagnosis not present

## 2018-09-15 DIAGNOSIS — I6381 Other cerebral infarction due to occlusion or stenosis of small artery: Secondary | ICD-10-CM | POA: Diagnosis not present

## 2018-09-15 DIAGNOSIS — R000 Tachycardia, unspecified: Secondary | ICD-10-CM | POA: Diagnosis not present

## 2018-09-15 DIAGNOSIS — R29702 NIHSS score 2: Secondary | ICD-10-CM | POA: Diagnosis present

## 2018-09-15 DIAGNOSIS — Z8673 Personal history of transient ischemic attack (TIA), and cerebral infarction without residual deficits: Secondary | ICD-10-CM | POA: Diagnosis not present

## 2018-09-15 DIAGNOSIS — Z7982 Long term (current) use of aspirin: Secondary | ICD-10-CM | POA: Diagnosis not present

## 2018-09-15 DIAGNOSIS — I10 Essential (primary) hypertension: Secondary | ICD-10-CM | POA: Diagnosis present

## 2018-09-15 DIAGNOSIS — I2510 Atherosclerotic heart disease of native coronary artery without angina pectoris: Secondary | ICD-10-CM | POA: Diagnosis present

## 2018-09-15 DIAGNOSIS — Z87891 Personal history of nicotine dependence: Secondary | ICD-10-CM | POA: Diagnosis not present

## 2018-09-15 DIAGNOSIS — G8194 Hemiplegia, unspecified affecting left nondominant side: Secondary | ICD-10-CM | POA: Diagnosis present

## 2018-09-15 DIAGNOSIS — K529 Noninfective gastroenteritis and colitis, unspecified: Secondary | ICD-10-CM | POA: Diagnosis not present

## 2018-09-15 DIAGNOSIS — R1111 Vomiting without nausea: Secondary | ICD-10-CM | POA: Diagnosis not present

## 2018-09-15 DIAGNOSIS — K219 Gastro-esophageal reflux disease without esophagitis: Secondary | ICD-10-CM | POA: Diagnosis present

## 2018-09-15 DIAGNOSIS — I161 Hypertensive emergency: Secondary | ICD-10-CM | POA: Diagnosis present

## 2018-09-15 DIAGNOSIS — E7800 Pure hypercholesterolemia, unspecified: Secondary | ICD-10-CM | POA: Diagnosis present

## 2018-09-15 DIAGNOSIS — I639 Cerebral infarction, unspecified: Secondary | ICD-10-CM | POA: Diagnosis not present

## 2018-12-17 DIAGNOSIS — Z79891 Long term (current) use of opiate analgesic: Secondary | ICD-10-CM | POA: Diagnosis not present

## 2018-12-17 DIAGNOSIS — I6789 Other cerebrovascular disease: Secondary | ICD-10-CM | POA: Diagnosis not present

## 2018-12-17 DIAGNOSIS — M542 Cervicalgia: Secondary | ICD-10-CM | POA: Diagnosis not present

## 2018-12-17 DIAGNOSIS — I69352 Hemiplegia and hemiparesis following cerebral infarction affecting left dominant side: Secondary | ICD-10-CM | POA: Diagnosis not present

## 2018-12-17 DIAGNOSIS — M545 Low back pain: Secondary | ICD-10-CM | POA: Diagnosis not present

## 2018-12-17 DIAGNOSIS — R296 Repeated falls: Secondary | ICD-10-CM | POA: Diagnosis not present

## 2018-12-24 ENCOUNTER — Encounter (INDEPENDENT_AMBULATORY_CARE_PROVIDER_SITE_OTHER): Payer: Self-pay | Admitting: *Deleted

## 2018-12-28 ENCOUNTER — Ambulatory Visit (HOSPITAL_COMMUNITY): Admit: 2018-12-28 | Payer: Medicare Other | Admitting: Interventional Cardiology

## 2018-12-28 ENCOUNTER — Emergency Department (HOSPITAL_COMMUNITY): Payer: Medicare Other | Admitting: Anesthesiology

## 2018-12-28 ENCOUNTER — Inpatient Hospital Stay (HOSPITAL_COMMUNITY): Admission: EM | Disposition: E | Payer: Self-pay | Source: Home / Self Care | Attending: Internal Medicine

## 2018-12-28 ENCOUNTER — Encounter (HOSPITAL_COMMUNITY): Admission: EM | Disposition: E | Payer: Self-pay | Source: Home / Self Care | Attending: Internal Medicine

## 2018-12-28 ENCOUNTER — Inpatient Hospital Stay (HOSPITAL_COMMUNITY): Payer: Medicare Other | Admitting: Anesthesiology

## 2018-12-28 ENCOUNTER — Emergency Department (HOSPITAL_COMMUNITY): Payer: Medicare Other

## 2018-12-28 ENCOUNTER — Emergency Department (HOSPITAL_COMMUNITY): Admit: 2018-12-28 | Payer: Medicare Other | Admitting: Interventional Cardiology

## 2018-12-28 ENCOUNTER — Inpatient Hospital Stay (HOSPITAL_COMMUNITY): Payer: Medicare Other

## 2018-12-28 ENCOUNTER — Inpatient Hospital Stay (HOSPITAL_COMMUNITY)
Admission: EM | Admit: 2018-12-28 | Discharge: 2019-01-15 | DRG: 215 | Disposition: E | Payer: Medicare Other | Attending: Internal Medicine | Admitting: Internal Medicine

## 2018-12-28 DIAGNOSIS — Z66 Do not resuscitate: Secondary | ICD-10-CM | POA: Diagnosis present

## 2018-12-28 DIAGNOSIS — E785 Hyperlipidemia, unspecified: Secondary | ICD-10-CM | POA: Diagnosis present

## 2018-12-28 DIAGNOSIS — T82855A Stenosis of coronary artery stent, initial encounter: Secondary | ICD-10-CM | POA: Diagnosis present

## 2018-12-28 DIAGNOSIS — F329 Major depressive disorder, single episode, unspecified: Secondary | ICD-10-CM | POA: Diagnosis present

## 2018-12-28 DIAGNOSIS — B349 Viral infection, unspecified: Secondary | ICD-10-CM | POA: Diagnosis not present

## 2018-12-28 DIAGNOSIS — R079 Chest pain, unspecified: Secondary | ICD-10-CM | POA: Diagnosis not present

## 2018-12-28 DIAGNOSIS — R570 Cardiogenic shock: Secondary | ICD-10-CM | POA: Diagnosis present

## 2018-12-28 DIAGNOSIS — I2102 ST elevation (STEMI) myocardial infarction involving left anterior descending coronary artery: Secondary | ICD-10-CM

## 2018-12-28 DIAGNOSIS — I5032 Chronic diastolic (congestive) heart failure: Secondary | ICD-10-CM | POA: Diagnosis present

## 2018-12-28 DIAGNOSIS — F419 Anxiety disorder, unspecified: Secondary | ICD-10-CM | POA: Diagnosis present

## 2018-12-28 DIAGNOSIS — Z881 Allergy status to other antibiotic agents status: Secondary | ICD-10-CM

## 2018-12-28 DIAGNOSIS — Z955 Presence of coronary angioplasty implant and graft: Secondary | ICD-10-CM | POA: Diagnosis not present

## 2018-12-28 DIAGNOSIS — D62 Acute posthemorrhagic anemia: Secondary | ICD-10-CM | POA: Diagnosis not present

## 2018-12-28 DIAGNOSIS — I472 Ventricular tachycardia: Secondary | ICD-10-CM | POA: Diagnosis present

## 2018-12-28 DIAGNOSIS — Z8249 Family history of ischemic heart disease and other diseases of the circulatory system: Secondary | ICD-10-CM

## 2018-12-28 DIAGNOSIS — Z515 Encounter for palliative care: Secondary | ICD-10-CM | POA: Diagnosis present

## 2018-12-28 DIAGNOSIS — Z9011 Acquired absence of right breast and nipple: Secondary | ICD-10-CM

## 2018-12-28 DIAGNOSIS — I110 Hypertensive heart disease with heart failure: Secondary | ICD-10-CM | POA: Diagnosis present

## 2018-12-28 DIAGNOSIS — J449 Chronic obstructive pulmonary disease, unspecified: Secondary | ICD-10-CM | POA: Diagnosis present

## 2018-12-28 DIAGNOSIS — I2119 ST elevation (STEMI) myocardial infarction involving other coronary artery of inferior wall: Secondary | ICD-10-CM | POA: Diagnosis not present

## 2018-12-28 DIAGNOSIS — I213 ST elevation (STEMI) myocardial infarction of unspecified site: Secondary | ICD-10-CM | POA: Diagnosis not present

## 2018-12-28 DIAGNOSIS — I2510 Atherosclerotic heart disease of native coronary artery without angina pectoris: Secondary | ICD-10-CM | POA: Diagnosis present

## 2018-12-28 DIAGNOSIS — Z95811 Presence of heart assist device: Secondary | ICD-10-CM | POA: Diagnosis not present

## 2018-12-28 DIAGNOSIS — R55 Syncope and collapse: Secondary | ICD-10-CM | POA: Diagnosis not present

## 2018-12-28 DIAGNOSIS — Q210 Ventricular septal defect: Secondary | ICD-10-CM

## 2018-12-28 DIAGNOSIS — Z20828 Contact with and (suspected) exposure to other viral communicable diseases: Secondary | ICD-10-CM | POA: Diagnosis present

## 2018-12-28 DIAGNOSIS — Z888 Allergy status to other drugs, medicaments and biological substances status: Secondary | ICD-10-CM

## 2018-12-28 DIAGNOSIS — R509 Fever, unspecified: Secondary | ICD-10-CM

## 2018-12-28 DIAGNOSIS — Z885 Allergy status to narcotic agent status: Secondary | ICD-10-CM

## 2018-12-28 DIAGNOSIS — Z853 Personal history of malignant neoplasm of breast: Secondary | ICD-10-CM

## 2018-12-28 DIAGNOSIS — I10 Essential (primary) hypertension: Secondary | ICD-10-CM | POA: Diagnosis not present

## 2018-12-28 DIAGNOSIS — Z7982 Long term (current) use of aspirin: Secondary | ICD-10-CM | POA: Diagnosis not present

## 2018-12-28 DIAGNOSIS — J9601 Acute respiratory failure with hypoxia: Secondary | ICD-10-CM | POA: Diagnosis present

## 2018-12-28 DIAGNOSIS — I499 Cardiac arrhythmia, unspecified: Secondary | ICD-10-CM | POA: Diagnosis not present

## 2018-12-28 DIAGNOSIS — N179 Acute kidney failure, unspecified: Secondary | ICD-10-CM | POA: Diagnosis present

## 2018-12-28 DIAGNOSIS — Z209 Contact with and (suspected) exposure to unspecified communicable disease: Secondary | ICD-10-CM | POA: Diagnosis not present

## 2018-12-28 DIAGNOSIS — I4901 Ventricular fibrillation: Secondary | ICD-10-CM | POA: Diagnosis present

## 2018-12-28 DIAGNOSIS — K219 Gastro-esophageal reflux disease without esophagitis: Secondary | ICD-10-CM | POA: Diagnosis present

## 2018-12-28 DIAGNOSIS — Z7902 Long term (current) use of antithrombotics/antiplatelets: Secondary | ICD-10-CM

## 2018-12-28 DIAGNOSIS — K7200 Acute and subacute hepatic failure without coma: Secondary | ICD-10-CM | POA: Diagnosis not present

## 2018-12-28 DIAGNOSIS — Z79899 Other long term (current) drug therapy: Secondary | ICD-10-CM | POA: Diagnosis not present

## 2018-12-28 DIAGNOSIS — I714 Abdominal aortic aneurysm, without rupture: Secondary | ICD-10-CM | POA: Diagnosis not present

## 2018-12-28 DIAGNOSIS — E872 Acidosis: Secondary | ICD-10-CM | POA: Diagnosis present

## 2018-12-28 DIAGNOSIS — I998 Other disorder of circulatory system: Secondary | ICD-10-CM | POA: Diagnosis not present

## 2018-12-28 DIAGNOSIS — Z87891 Personal history of nicotine dependence: Secondary | ICD-10-CM

## 2018-12-28 DIAGNOSIS — Z923 Personal history of irradiation: Secondary | ICD-10-CM

## 2018-12-28 DIAGNOSIS — I779 Disorder of arteries and arterioles, unspecified: Secondary | ICD-10-CM | POA: Diagnosis present

## 2018-12-28 DIAGNOSIS — Z880 Allergy status to penicillin: Secondary | ICD-10-CM

## 2018-12-28 DIAGNOSIS — Z8673 Personal history of transient ischemic attack (TIA), and cerebral infarction without residual deficits: Secondary | ICD-10-CM

## 2018-12-28 HISTORY — PX: LEFT HEART CATH AND CORONARY ANGIOGRAPHY: CATH118249

## 2018-12-28 HISTORY — PX: LOWER EXTREMITY ANGIOGRAM: SHX5508

## 2018-12-28 HISTORY — PX: FEMORAL ARTERY EXPLORATION: SHX5160

## 2018-12-28 HISTORY — PX: CORONARY/GRAFT ACUTE MI REVASCULARIZATION: CATH118305

## 2018-12-28 HISTORY — PX: VENTRICULAR ASSIST DEVICE INSERTION: CATH118273

## 2018-12-28 LAB — COOXEMETRY PANEL
Carboxyhemoglobin: 0.9 % (ref 0.5–1.5)
Methemoglobin: 1.6 % — ABNORMAL HIGH (ref 0.0–1.5)
O2 Saturation: 64.6 %
Total hemoglobin: 8.9 g/dL — ABNORMAL LOW (ref 12.0–16.0)

## 2018-12-28 LAB — POCT I-STAT 7, (LYTES, BLD GAS, ICA,H+H)
Acid-base deficit: 2 mmol/L (ref 0.0–2.0)
Acid-base deficit: 4 mmol/L — ABNORMAL HIGH (ref 0.0–2.0)
Acid-base deficit: 4 mmol/L — ABNORMAL HIGH (ref 0.0–2.0)
Acid-base deficit: 10 mmol/L — ABNORMAL HIGH (ref 0.0–2.0)
Acid-base deficit: 12 mmol/L — ABNORMAL HIGH (ref 0.0–2.0)
Acid-base deficit: 20 mmol/L — ABNORMAL HIGH (ref 0.0–2.0)
Acid-base deficit: 15 mmol/L — ABNORMAL HIGH (ref 0.0–2.0)
Acid-base deficit: 16 mmol/L — ABNORMAL HIGH (ref 0.0–2.0)
Acid-base deficit: 10 mmol/L — ABNORMAL HIGH (ref 0.0–2.0)
Bicarbonate: 21 mmol/L (ref 20.0–28.0)
Bicarbonate: 21.4 mmol/L (ref 20.0–28.0)
Bicarbonate: 19.8 mmol/L — ABNORMAL LOW (ref 20.0–28.0)
Bicarbonate: 10.5 mmol/L — ABNORMAL LOW (ref 20.0–28.0)
Bicarbonate: 15.2 mmol/L — ABNORMAL LOW (ref 20.0–28.0)
Bicarbonate: 17.6 mmol/L — ABNORMAL LOW (ref 20.0–28.0)
Bicarbonate: 13.3 mmol/L — ABNORMAL LOW (ref 20.0–28.0)
Bicarbonate: 12 mmol/L — ABNORMAL LOW (ref 20.0–28.0)
Bicarbonate: 17.2 mmol/L — ABNORMAL LOW (ref 20.0–28.0)
Calcium, Ion: 1.06 mmol/L — ABNORMAL LOW (ref 1.15–1.40)
Calcium, Ion: 1.09 mmol/L — ABNORMAL LOW (ref 1.15–1.40)
Calcium, Ion: 0.98 mmol/L — ABNORMAL LOW (ref 1.15–1.40)
Calcium, Ion: 0.49 mmol/L — CL (ref 1.15–1.40)
Calcium, Ion: 0.99 mmol/L — ABNORMAL LOW (ref 1.15–1.40)
Calcium, Ion: 1.01 mmol/L — ABNORMAL LOW (ref 1.15–1.40)
Calcium, Ion: 1.08 mmol/L — ABNORMAL LOW (ref 1.15–1.40)
Calcium, Ion: 1.1 mmol/L — ABNORMAL LOW (ref 1.15–1.40)
Calcium, Ion: 0.99 mmol/L — ABNORMAL LOW (ref 1.15–1.40)
HCT: 32 % — ABNORMAL LOW (ref 36.0–46.0)
HCT: 33 % — ABNORMAL LOW (ref 36.0–46.0)
HCT: 29 % — ABNORMAL LOW (ref 36.0–46.0)
HCT: 21 % — ABNORMAL LOW (ref 36.0–46.0)
HCT: 28 % — ABNORMAL LOW (ref 36.0–46.0)
HCT: 32 % — ABNORMAL LOW (ref 36.0–46.0)
HCT: 29 % — ABNORMAL LOW (ref 36.0–46.0)
HCT: 26 % — ABNORMAL LOW (ref 36.0–46.0)
HCT: 24 % — ABNORMAL LOW (ref 36.0–46.0)
Hemoglobin: 10.9 g/dL — ABNORMAL LOW (ref 12.0–15.0)
Hemoglobin: 11.2 g/dL — ABNORMAL LOW (ref 12.0–15.0)
Hemoglobin: 9.9 g/dL — ABNORMAL LOW (ref 12.0–15.0)
Hemoglobin: 10.9 g/dL — ABNORMAL LOW (ref 12.0–15.0)
Hemoglobin: 7.1 g/dL — ABNORMAL LOW (ref 12.0–15.0)
Hemoglobin: 9.5 g/dL — ABNORMAL LOW (ref 12.0–15.0)
Hemoglobin: 9.9 g/dL — ABNORMAL LOW (ref 12.0–15.0)
Hemoglobin: 8.8 g/dL — ABNORMAL LOW (ref 12.0–15.0)
Hemoglobin: 8.2 g/dL — ABNORMAL LOW (ref 12.0–15.0)
O2 Saturation: 100 %
O2 Saturation: 99 %
O2 Saturation: 100 %
O2 Saturation: 100 %
O2 Saturation: 61 %
O2 Saturation: 77 %
O2 Saturation: 100 %
O2 Saturation: 100 %
O2 Saturation: 100 %
Patient temperature: 36
Patient temperature: 35.8
Potassium: 3.5 mmol/L (ref 3.5–5.1)
Potassium: 3.7 mmol/L (ref 3.5–5.1)
Potassium: 3.7 mmol/L (ref 3.5–5.1)
Potassium: 3.4 mmol/L — ABNORMAL LOW (ref 3.5–5.1)
Potassium: 4.3 mmol/L (ref 3.5–5.1)
Potassium: 5.2 mmol/L — ABNORMAL HIGH (ref 3.5–5.1)
Potassium: 5.7 mmol/L — ABNORMAL HIGH (ref 3.5–5.1)
Potassium: 4.2 mmol/L (ref 3.5–5.1)
Potassium: 4 mmol/L (ref 3.5–5.1)
Sodium: 136 mmol/L (ref 135–145)
Sodium: 136 mmol/L (ref 135–145)
Sodium: 138 mmol/L (ref 135–145)
Sodium: 117 mmol/L — CL (ref 135–145)
Sodium: 136 mmol/L (ref 135–145)
Sodium: 136 mmol/L (ref 135–145)
Sodium: 137 mmol/L (ref 135–145)
Sodium: 142 mmol/L (ref 135–145)
Sodium: 144 mmol/L (ref 135–145)
TCO2: 22 mmol/L (ref 22–32)
TCO2: 23 mmol/L (ref 22–32)
TCO2: 21 mmol/L — ABNORMAL LOW (ref 22–32)
TCO2: 12 mmol/L — ABNORMAL LOW (ref 22–32)
TCO2: 16 mmol/L — ABNORMAL LOW (ref 22–32)
TCO2: 19 mmol/L — ABNORMAL LOW (ref 22–32)
TCO2: 14 mmol/L — ABNORMAL LOW (ref 22–32)
TCO2: 13 mmol/L — ABNORMAL LOW (ref 22–32)
TCO2: 18 mmol/L — ABNORMAL LOW (ref 22–32)
pCO2 arterial: 30.7 mmHg — ABNORMAL LOW (ref 32.0–48.0)
pCO2 arterial: 39.2 mmHg (ref 32.0–48.0)
pCO2 arterial: 32.2 mmHg (ref 32.0–48.0)
pCO2 arterial: 31.2 mmHg — ABNORMAL LOW (ref 32.0–48.0)
pCO2 arterial: 48 mmHg (ref 32.0–48.0)
pCO2 arterial: 53.3 mmHg — ABNORMAL HIGH (ref 32.0–48.0)
pCO2 arterial: 38.7 mmHg (ref 32.0–48.0)
pCO2 arterial: 34.5 mmHg (ref 32.0–48.0)
pCO2 arterial: 38.6 mmHg (ref 32.0–48.0)
pH, Arterial: 7.346 — ABNORMAL LOW (ref 7.350–7.450)
pH, Arterial: 7.443 (ref 7.350–7.450)
pH, Arterial: 7.396 (ref 7.350–7.450)
pH, Arterial: 6.948 — CL (ref 7.350–7.450)
pH, Arterial: 7.126 — CL (ref 7.350–7.450)
pH, Arterial: 7.295 — ABNORMAL LOW (ref 7.350–7.450)
pH, Arterial: 7.144 — CL (ref 7.350–7.450)
pH, Arterial: 7.143 — CL (ref 7.350–7.450)
pH, Arterial: 7.251 — ABNORMAL LOW (ref 7.350–7.450)
pO2, Arterial: 142 mmHg — ABNORMAL HIGH (ref 83.0–108.0)
pO2, Arterial: 548 mmHg — ABNORMAL HIGH (ref 83.0–108.0)
pO2, Arterial: 386 mmHg — ABNORMAL HIGH (ref 83.0–108.0)
pO2, Arterial: 42 mmHg — ABNORMAL LOW (ref 83.0–108.0)
pO2, Arterial: 514 mmHg — ABNORMAL HIGH (ref 83.0–108.0)
pO2, Arterial: 65 mmHg — ABNORMAL LOW (ref 83.0–108.0)
pO2, Arterial: 386 mmHg — ABNORMAL HIGH (ref 83.0–108.0)
pO2, Arterial: 397 mmHg — ABNORMAL HIGH (ref 83.0–108.0)
pO2, Arterial: 421 mmHg — ABNORMAL HIGH (ref 83.0–108.0)

## 2018-12-28 LAB — POCT I-STAT EG7
Acid-base deficit: 7 mmol/L — ABNORMAL HIGH (ref 0.0–2.0)
Acid-base deficit: 7 mmol/L — ABNORMAL HIGH (ref 0.0–2.0)
Acid-base deficit: 12 mmol/L — ABNORMAL HIGH (ref 0.0–2.0)
Bicarbonate: 18.8 mmol/L — ABNORMAL LOW (ref 20.0–28.0)
Bicarbonate: 19.1 mmol/L — ABNORMAL LOW (ref 20.0–28.0)
Bicarbonate: 18.1 mmol/L — ABNORMAL LOW (ref 20.0–28.0)
Calcium, Ion: 1.04 mmol/L — ABNORMAL LOW (ref 1.15–1.40)
Calcium, Ion: 1.06 mmol/L — ABNORMAL LOW (ref 1.15–1.40)
Calcium, Ion: 1.02 mmol/L — ABNORMAL LOW (ref 1.15–1.40)
HCT: 30 % — ABNORMAL LOW (ref 36.0–46.0)
HCT: 30 % — ABNORMAL LOW (ref 36.0–46.0)
HCT: 31 % — ABNORMAL LOW (ref 36.0–46.0)
Hemoglobin: 10.2 g/dL — ABNORMAL LOW (ref 12.0–15.0)
Hemoglobin: 10.2 g/dL — ABNORMAL LOW (ref 12.0–15.0)
Hemoglobin: 10.5 g/dL — ABNORMAL LOW (ref 12.0–15.0)
O2 Saturation: 92 %
O2 Saturation: 93 %
O2 Saturation: 62 %
Potassium: 3.8 mmol/L (ref 3.5–5.1)
Potassium: 3.9 mmol/L (ref 3.5–5.1)
Potassium: 4.9 mmol/L (ref 3.5–5.1)
Sodium: 137 mmol/L (ref 135–145)
Sodium: 137 mmol/L (ref 135–145)
Sodium: 137 mmol/L (ref 135–145)
TCO2: 20 mmol/L — ABNORMAL LOW (ref 22–32)
TCO2: 20 mmol/L — ABNORMAL LOW (ref 22–32)
TCO2: 20 mmol/L — ABNORMAL LOW (ref 22–32)
pCO2, Ven: 38 mmHg — ABNORMAL LOW (ref 44.0–60.0)
pCO2, Ven: 39.1 mmHg — ABNORMAL LOW (ref 44.0–60.0)
pCO2, Ven: 59.2 mmHg (ref 44.0–60.0)
pH, Ven: 7.297 (ref 7.250–7.430)
pH, Ven: 7.303 (ref 7.250–7.430)
pH, Ven: 7.094 — CL (ref 7.250–7.430)
pO2, Ven: 70 mmHg — ABNORMAL HIGH (ref 32.0–45.0)
pO2, Ven: 73 mmHg — ABNORMAL HIGH (ref 32.0–45.0)
pO2, Ven: 44 mmHg (ref 32.0–45.0)

## 2018-12-28 LAB — COMPREHENSIVE METABOLIC PANEL
ALT: 125 U/L — ABNORMAL HIGH (ref 0–44)
ALT: 461 U/L — ABNORMAL HIGH (ref 0–44)
AST: 382 U/L — ABNORMAL HIGH (ref 15–41)
AST: 958 U/L — ABNORMAL HIGH (ref 15–41)
Albumin: 2.6 g/dL — ABNORMAL LOW (ref 3.5–5.0)
Albumin: 2.1 g/dL — ABNORMAL LOW (ref 3.5–5.0)
Alkaline Phosphatase: 68 U/L (ref 38–126)
Alkaline Phosphatase: 79 U/L (ref 38–126)
Anion gap: 17 — ABNORMAL HIGH (ref 5–15)
Anion gap: 25 — ABNORMAL HIGH (ref 5–15)
BUN: 29 mg/dL — ABNORMAL HIGH (ref 8–23)
BUN: 27 mg/dL — ABNORMAL HIGH (ref 8–23)
CO2: 15 mmol/L — ABNORMAL LOW (ref 22–32)
CO2: 10 mmol/L — ABNORMAL LOW (ref 22–32)
Calcium: 7.7 mg/dL — ABNORMAL LOW (ref 8.9–10.3)
Calcium: 8.6 mg/dL — ABNORMAL LOW (ref 8.9–10.3)
Chloride: 103 mmol/L (ref 98–111)
Chloride: 106 mmol/L (ref 98–111)
Creatinine, Ser: 1.55 mg/dL — ABNORMAL HIGH (ref 0.44–1.00)
Creatinine, Ser: 2.04 mg/dL — ABNORMAL HIGH (ref 0.44–1.00)
GFR calc Af Amer: 39 mL/min — ABNORMAL LOW (ref >60)
GFR calc Af Amer: 28 mL/min — ABNORMAL LOW (ref >60)
GFR calc non Af Amer: 34 mL/min — ABNORMAL LOW (ref >60)
GFR calc non Af Amer: 24 mL/min — ABNORMAL LOW (ref >60)
Glucose, Bld: 201 mg/dL — ABNORMAL HIGH (ref 70–99)
Glucose, Bld: 158 mg/dL — ABNORMAL HIGH (ref 70–99)
Potassium: 4.3 mmol/L (ref 3.5–5.1)
Potassium: 4.4 mmol/L (ref 3.5–5.1)
Sodium: 135 mmol/L (ref 135–145)
Sodium: 141 mmol/L (ref 135–145)
Total Bilirubin: 1.5 mg/dL — ABNORMAL HIGH (ref 0.3–1.2)
Total Bilirubin: 2.3 mg/dL — ABNORMAL HIGH (ref 0.3–1.2)
Total Protein: 5.6 g/dL — ABNORMAL LOW (ref 6.5–8.1)
Total Protein: 4.5 g/dL — ABNORMAL LOW (ref 6.5–8.1)

## 2018-12-28 LAB — POCT I-STAT, CHEM 8
BUN: 27 mg/dL — ABNORMAL HIGH (ref 8–23)
Calcium, Ion: 1.09 mmol/L — ABNORMAL LOW (ref 1.15–1.40)
Chloride: 99 mmol/L (ref 98–111)
Creatinine, Ser: 1.3 mg/dL — ABNORMAL HIGH (ref 0.44–1.00)
Glucose, Bld: 145 mg/dL — ABNORMAL HIGH (ref 70–99)
HCT: 34 % — ABNORMAL LOW (ref 36.0–46.0)
Hemoglobin: 11.6 g/dL — ABNORMAL LOW (ref 12.0–15.0)
Potassium: 3.6 mmol/L (ref 3.5–5.1)
Sodium: 135 mmol/L (ref 135–145)
TCO2: 22 mmol/L (ref 22–32)

## 2018-12-28 LAB — LIPID PANEL
Cholesterol: 138 mg/dL (ref 0–200)
HDL: 27 mg/dL — ABNORMAL LOW (ref >40)
LDL Cholesterol: 77 mg/dL (ref 0–99)
Total CHOL/HDL Ratio: 5.1 RATIO
Triglycerides: 168 mg/dL — ABNORMAL HIGH (ref <150)
VLDL: 34 mg/dL (ref 0–40)

## 2018-12-28 LAB — CBC WITH DIFFERENTIAL/PLATELET
Abs Immature Granulocytes: 0.3 10*3/uL — ABNORMAL HIGH (ref 0.00–0.07)
Basophils Absolute: 0.1 10*3/uL (ref 0.0–0.1)
Basophils Relative: 0 %
Eosinophils Absolute: 0 10*3/uL (ref 0.0–0.5)
Eosinophils Relative: 0 %
HCT: 31 % — ABNORMAL LOW (ref 36.0–46.0)
Hemoglobin: 10.1 g/dL — ABNORMAL LOW (ref 12.0–15.0)
Immature Granulocytes: 1 %
Lymphocytes Relative: 12 %
Lymphs Abs: 2.6 10*3/uL (ref 0.7–4.0)
MCH: 30 pg (ref 26.0–34.0)
MCHC: 32.6 g/dL (ref 30.0–36.0)
MCV: 92 fL (ref 80.0–100.0)
Monocytes Absolute: 1.4 10*3/uL — ABNORMAL HIGH (ref 0.1–1.0)
Monocytes Relative: 6 %
Neutro Abs: 17.5 10*3/uL — ABNORMAL HIGH (ref 1.7–7.7)
Neutrophils Relative %: 81 %
Platelets: 218 10*3/uL (ref 150–400)
RBC: 3.37 MIL/uL — ABNORMAL LOW (ref 3.87–5.11)
RDW: 14.1 % (ref 11.5–15.5)
WBC: 21.8 10*3/uL — ABNORMAL HIGH (ref 4.0–10.5)
nRBC: 0 % (ref 0.0–0.2)

## 2018-12-28 LAB — PREPARE RBC (CROSSMATCH)

## 2018-12-28 LAB — ECHOCARDIOGRAM COMPLETE
Height: 61 in
Weight: 1840 oz

## 2018-12-28 LAB — HEMOGLOBIN A1C
Hgb A1c MFr Bld: 5.8 % — ABNORMAL HIGH (ref 4.8–5.6)
Mean Plasma Glucose: 119.76 mg/dL

## 2018-12-28 LAB — ABO/RH: ABO/RH(D): A POS

## 2018-12-28 LAB — LACTIC ACID, PLASMA
Lactic Acid, Venous: 9.4 mmol/L (ref 0.5–1.9)
Lactic Acid, Venous: >11 mmol/L (ref 0.5–1.9)

## 2018-12-28 LAB — CBC
HCT: 30.2 % — ABNORMAL LOW (ref 36.0–46.0)
Hemoglobin: 9.1 g/dL — ABNORMAL LOW (ref 12.0–15.0)
MCH: 30.2 pg (ref 26.0–34.0)
MCHC: 30.1 g/dL (ref 30.0–36.0)
MCV: 100.3 fL — ABNORMAL HIGH (ref 80.0–100.0)
Platelets: 116 10*3/uL — ABNORMAL LOW (ref 150–400)
RBC: 3.01 MIL/uL — ABNORMAL LOW (ref 3.87–5.11)
RDW: 14.4 % (ref 11.5–15.5)
WBC: 17.6 10*3/uL — ABNORMAL HIGH (ref 4.0–10.5)
nRBC: 0.3 % — ABNORMAL HIGH (ref 0.0–0.2)

## 2018-12-28 LAB — TROPONIN I (HIGH SENSITIVITY)
Troponin I (High Sensitivity): >27000 ng/L (ref <18)
Troponin I (High Sensitivity): >27000 ng/L (ref <18)

## 2018-12-28 LAB — PROTIME-INR
INR: 4.3 (ref 0.8–1.2)
Prothrombin Time: 40.5 seconds — ABNORMAL HIGH (ref 11.4–15.2)

## 2018-12-28 LAB — POCT ACTIVATED CLOTTING TIME
Activated Clotting Time: 334 seconds
Activated Clotting Time: 296 seconds
Activated Clotting Time: 235 seconds
Activated Clotting Time: 213 seconds

## 2018-12-28 LAB — SARS CORONAVIRUS 2 BY RT PCR (HOSPITAL ORDER, PERFORMED IN ~~LOC~~ HOSPITAL LAB): SARS Coronavirus 2: NEGATIVE

## 2018-12-28 LAB — APTT: aPTT: >200 seconds (ref 24–36)

## 2018-12-28 SURGERY — CORONARY/GRAFT ACUTE MI REVASCULARIZATION
Anesthesia: LOCAL

## 2018-12-28 SURGERY — EXPLORATION, ARTERY, FEMORAL
Anesthesia: General | Laterality: Right

## 2018-12-28 MED ORDER — HEPARIN (PORCINE) IN NACL 1000-0.9 UT/500ML-% IV SOLN
INTRAVENOUS | Status: DC | PRN
  Administered 2018-12-28 (×2): 500 mL

## 2018-12-28 MED ORDER — VASOPRESSIN 20 UNIT/ML IV SOLN
0.0300 [IU]/min | Freq: Once | INTRAVENOUS | Status: DC
  Filled 2018-12-28 (×2): qty 2

## 2018-12-28 MED ORDER — HYDRALAZINE HCL 20 MG/ML IJ SOLN: 10.0000 mg | INTRAMUSCULAR | Status: DC | PRN

## 2018-12-28 MED ORDER — ONDANSETRON HCL 4 MG/2ML IJ SOLN
INTRAMUSCULAR | Status: DC | PRN
  Administered 2018-12-28: 4 mg via INTRAVENOUS

## 2018-12-28 MED ORDER — FENTANYL 2500MCG IN NS 250ML (10MCG/ML) PREMIX INFUSION
0.0000 ug/h | INTRAVENOUS | Status: DC
  Administered 2018-12-28: 25 ug/h via INTRAVENOUS

## 2018-12-28 MED ORDER — FENTANYL CITRATE (PF) 100 MCG/2ML IJ SOLN
INTRAMUSCULAR | Status: AC
  Filled 2018-12-28: qty 2

## 2018-12-28 MED ORDER — SODIUM BICARBONATE 8.4 % IV SOLN
INTRAVENOUS | Status: AC
  Administered 2018-12-29: 50 meq
  Filled 2018-12-28: qty 50

## 2018-12-28 MED ORDER — SODIUM CHLORIDE 0.9% IV SOLUTION
Freq: Once | INTRAVENOUS | Status: AC
  Administered 2018-12-28: 22:00:00 via INTRAVENOUS

## 2018-12-28 MED ORDER — IOHEXOL 350 MG/ML SOLN
INTRAVENOUS | Status: DC | PRN
  Administered 2018-12-28: 20:00:00 150 mL via INTRAVENOUS

## 2018-12-28 MED ORDER — MIDAZOLAM HCL 2 MG/2ML IJ SOLN
INTRAMUSCULAR | Status: DC | PRN
  Administered 2018-12-28: 2 mg via INTRAVENOUS

## 2018-12-28 MED ORDER — HEPARIN (PORCINE) 25000 UT/250ML-% IV SOLN
200.0000 [IU]/h | INTRAVENOUS | Status: DC
  Filled 2018-12-28: qty 250

## 2018-12-28 MED ORDER — CALCIUM CHLORIDE 10 % IV SOLN
INTRAVENOUS | Status: DC | PRN
  Administered 2018-12-28: 400 mg via INTRAVENOUS
  Administered 2018-12-28 (×2): 300 mg via INTRAVENOUS

## 2018-12-28 MED ORDER — VANCOMYCIN HCL IN DEXTROSE 1-5 GM/200ML-% IV SOLN
1000.0000 mg | Freq: Once | INTRAVENOUS | Status: AC
  Administered 2018-12-28: 1000 mg via INTRAVENOUS
  Filled 2018-12-28: qty 200

## 2018-12-28 MED ORDER — NOREPINEPHRINE BITARTRATE 1 MG/ML IV SOLN
INTRAVENOUS | Status: DC | PRN
  Administered 2018-12-28: 10 ug/min via INTRAVENOUS

## 2018-12-28 MED ORDER — HEPARIN (PORCINE) IN NACL 1000-0.9 UT/500ML-% IV SOLN
INTRAVENOUS | Status: AC
  Filled 2018-12-28: qty 1000

## 2018-12-28 MED ORDER — ONDANSETRON HCL 4 MG/2ML IJ SOLN: 4.0000 mg | Freq: Four times a day (QID) | INTRAMUSCULAR | Status: DC | PRN

## 2018-12-28 MED ORDER — PHENYLEPHRINE HCL (PRESSORS) 10 MG/ML IV SOLN
INTRAVENOUS | Status: DC | PRN
  Administered 2018-12-28: 80 ug via INTRAVENOUS

## 2018-12-28 MED ORDER — SODIUM BICARBONATE 8.4 % IV SOLN
INTRAVENOUS | Status: AC
  Administered 2018-12-28: 23:00:00 50 meq
  Filled 2018-12-28: qty 50

## 2018-12-28 MED ORDER — LIDOCAINE HCL (PF) 1 % IJ SOLN
INTRAMUSCULAR | Status: DC | PRN
  Administered 2018-12-28: 5 mL

## 2018-12-28 MED ORDER — SODIUM BICARBONATE 8.4 % IV SOLN
INTRAVENOUS | Status: DC | PRN
  Administered 2018-12-28: 100 meq via INTRAVENOUS

## 2018-12-28 MED ORDER — SODIUM CHLORIDE 0.9% FLUSH: 3.0000 mL | INTRAVENOUS | Status: DC | PRN

## 2018-12-28 MED ORDER — MIDAZOLAM HCL 2 MG/2ML IJ SOLN
INTRAMUSCULAR | Status: DC | PRN
  Administered 2018-12-28 (×2): 1 mg via INTRAVENOUS
  Administered 2018-12-28: 2 mg via INTRAVENOUS

## 2018-12-28 MED ORDER — LIDOCAINE HCL (PF) 1 % IJ SOLN
INTRAMUSCULAR | Status: DC | PRN
  Administered 2018-12-28 (×2): 5 mL
  Administered 2018-12-28 (×2): 10 mL

## 2018-12-28 MED ORDER — NITROGLYCERIN 1 MG/10 ML FOR IR/CATH LAB
INTRA_ARTERIAL | Status: AC
  Filled 2018-12-28: qty 10

## 2018-12-28 MED ORDER — VANCOMYCIN HCL IN DEXTROSE 1-5 GM/200ML-% IV SOLN
1000.0000 mg | Freq: Once | INTRAVENOUS | Status: AC
  Administered 2018-12-29: 1000 mg via INTRAVENOUS
  Filled 2018-12-28: qty 200

## 2018-12-28 MED ORDER — SODIUM BICARBONATE 8.4 % IV SOLN
INTRAVENOUS | Status: AC
  Filled 2018-12-28: qty 100

## 2018-12-28 MED ORDER — ASPIRIN EC 81 MG PO TBEC: 81.0000 mg | DELAYED_RELEASE_TABLET | Freq: Every evening | ORAL | Status: DC

## 2018-12-28 MED ORDER — MIDAZOLAM HCL 2 MG/2ML IJ SOLN
INTRAMUSCULAR | Status: AC
  Filled 2018-12-28: qty 2

## 2018-12-28 MED ORDER — VASOPRESSIN 20 UNIT/ML IV SOLN
INTRAVENOUS | Status: DC | PRN
  Administered 2018-12-28: 2 [IU] via INTRAVENOUS

## 2018-12-28 MED ORDER — VASOPRESSIN 20 UNIT/ML IV SOLN
INTRAVENOUS | Status: AC
  Filled 2018-12-28: qty 1

## 2018-12-28 MED ORDER — SODIUM CHLORIDE 0.9 % IV SOLN
INTRAVENOUS | Status: DC | PRN
  Administered 2018-12-28: 20:00:00 via INTRAVENOUS

## 2018-12-28 MED ORDER — HEPARIN SODIUM (PORCINE) 1000 UNIT/ML IJ SOLN
INTRAMUSCULAR | Status: DC | PRN
  Administered 2018-12-28: 5000 [IU] via INTRAVENOUS

## 2018-12-28 MED ORDER — LACTATED RINGERS IV SOLN
INTRAVENOUS | Status: DC | PRN
  Administered 2018-12-28: 20:00:00 via INTRAVENOUS

## 2018-12-28 MED ORDER — SODIUM CHLORIDE 0.9 % IV SOLN
INTRAVENOUS | Status: AC | PRN
  Administered 2018-12-28: 10 mL/h via INTRAVENOUS

## 2018-12-28 MED ORDER — SODIUM BICARBONATE 8.4 % IV SOLN: 100.0000 meq | Freq: Once | INTRAVENOUS | Status: AC

## 2018-12-28 MED ORDER — FENTANYL CITRATE (PF) 100 MCG/2ML IJ SOLN
INTRAMUSCULAR | Status: DC | PRN
  Administered 2018-12-28 (×2): 50 ug via INTRAVENOUS
  Administered 2018-12-28 (×2): 25 ug via INTRAVENOUS
  Administered 2018-12-28 (×2): 50 ug via INTRAVENOUS

## 2018-12-28 MED ORDER — SODIUM CHLORIDE 0.9 % IV SOLN
1.0000 g | Freq: Every day | INTRAVENOUS | Status: DC
  Administered 2018-12-29: 1 g via INTRAVENOUS
  Filled 2018-12-28 (×2): qty 1

## 2018-12-28 MED ORDER — LABETALOL HCL 5 MG/ML IV SOLN: 10.0000 mg | INTRAVENOUS | Status: DC | PRN

## 2018-12-28 MED ORDER — SUCCINYLCHOLINE CHLORIDE 20 MG/ML IJ SOLN
INTRAMUSCULAR | Status: DC | PRN
  Administered 2018-12-28: 60 mg via INTRAVENOUS

## 2018-12-28 MED ORDER — EPINEPHRINE 1 MG/10ML IJ SOSY
PREFILLED_SYRINGE | INTRAMUSCULAR | Status: DC | PRN
  Administered 2018-12-28: 0.5 mg via INTRAVENOUS

## 2018-12-28 MED ORDER — EPINEPHRINE HCL 5 MG/250ML IV SOLN IN NS
0.5000 ug/min | INTRAVENOUS | Status: DC
  Filled 2018-12-28: qty 250

## 2018-12-28 MED ORDER — SODIUM CHLORIDE 0.9 % IV SOLN
INTRAVENOUS | Status: DC | PRN
  Administered 2018-12-28: 15:00:00 1.75 mg/kg/h via INTRAVENOUS

## 2018-12-28 MED ORDER — BIVALIRUDIN TRIFLUOROACETATE 250 MG IV SOLR
INTRAVENOUS | Status: AC
  Filled 2018-12-28: qty 250

## 2018-12-28 MED ORDER — ROCURONIUM BROMIDE 10 MG/ML (PF) SYRINGE
PREFILLED_SYRINGE | INTRAVENOUS | Status: DC | PRN
  Administered 2018-12-28: 50 mg via INTRAVENOUS

## 2018-12-28 MED ORDER — SODIUM CHLORIDE 0.9% FLUSH
3.0000 mL | Freq: Two times a day (BID) | INTRAVENOUS | Status: DC
  Administered 2018-12-29: 05:00:00 3 mL via INTRAVENOUS

## 2018-12-28 MED ORDER — SODIUM BICARBONATE-DEXTROSE 150-5 MEQ/L-% IV SOLN
150.0000 meq | INTRAVENOUS | Status: DC
  Administered 2018-12-28 – 2018-12-29 (×2): 150 meq via INTRAVENOUS
  Filled 2018-12-28 (×7): qty 1000

## 2018-12-28 MED ORDER — NOREPINEPHRINE 4 MG/250ML-% IV SOLN
0.0000 ug/min | Freq: Once | INTRAVENOUS | Status: AC
  Administered 2018-12-28: 20 ug/min via INTRAVENOUS
  Filled 2018-12-28: qty 250

## 2018-12-28 MED ORDER — ETOMIDATE 2 MG/ML IV SOLN
INTRAVENOUS | Status: DC | PRN
  Administered 2018-12-28: 8 mg via INTRAVENOUS

## 2018-12-28 MED ORDER — AMIODARONE HCL IN DEXTROSE 360-4.14 MG/200ML-% IV SOLN
INTRAVENOUS | Status: AC
  Filled 2018-12-28: qty 200

## 2018-12-28 MED ORDER — 0.9 % SODIUM CHLORIDE (POUR BTL) OPTIME
TOPICAL | Status: DC | PRN
  Administered 2018-12-28: 1000 mL

## 2018-12-28 MED ORDER — ASPIRIN 81 MG PO CHEW: 81.0000 mg | CHEWABLE_TABLET | Freq: Every day | ORAL | Status: DC

## 2018-12-28 MED ORDER — BIVALIRUDIN BOLUS VIA INFUSION - CUPID
INTRAVENOUS | Status: DC | PRN
  Administered 2018-12-28: 15:00:00 39.15 mg via INTRAVENOUS

## 2018-12-28 MED ORDER — SODIUM CHLORIDE 0.9 % IV SOLN: 250.0000 mL | INTRAVENOUS | Status: DC | PRN

## 2018-12-28 MED ORDER — SODIUM CHLORIDE (PF) 0.9 % IJ SOLN
INTRAVENOUS | Status: DC | PRN
  Administered 2018-12-28: 100 mL via INTRAMUSCULAR

## 2018-12-28 MED ORDER — EPINEPHRINE 1 MG/10ML IJ SOSY
PREFILLED_SYRINGE | INTRAMUSCULAR | Status: AC
  Filled 2018-12-28: qty 10

## 2018-12-28 MED ORDER — VANCOMYCIN VARIABLE DOSE PER UNSTABLE RENAL FUNCTION (PHARMACIST DOSING): Status: DC

## 2018-12-28 MED ORDER — HEPARIN SODIUM (PORCINE) 5000 UNIT/ML IJ SOLN
50000.0000 [IU] | INTRAVENOUS | Status: DC
  Administered 2018-12-28: 50000 [IU]
  Filled 2018-12-28: qty 10

## 2018-12-28 MED ORDER — EPINEPHRINE HCL 5 MG/250ML IV SOLN IN NS
0.5000 ug/min | INTRAVENOUS | Status: DC
  Administered 2018-12-28: 2 ug/min via INTRAVENOUS
  Administered 2018-12-29: 30 ug/min via INTRAVENOUS
  Administered 2018-12-29: 20 ug/min via INTRAVENOUS
  Administered 2018-12-29: 5 ug/min via INTRAVENOUS
  Filled 2018-12-28 (×5): qty 250

## 2018-12-28 MED ORDER — VANCOMYCIN HCL IN DEXTROSE 1-5 GM/200ML-% IV SOLN
INTRAVENOUS | Status: AC
  Filled 2018-12-28: qty 200

## 2018-12-28 MED ORDER — AMIODARONE HCL 150 MG/3ML IV SOLN
INTRAVENOUS | Status: DC | PRN
  Administered 2018-12-28: 150 mg via INTRAVENOUS

## 2018-12-28 MED ORDER — FENTANYL 2500MCG IN NS 250ML (10MCG/ML) PREMIX INFUSION
0.0000 ug/h | Freq: Once | INTRAVENOUS | Status: AC
  Administered 2018-12-28: 100 ug/h via INTRAVENOUS
  Filled 2018-12-28: qty 250

## 2018-12-28 MED ORDER — SODIUM CHLORIDE 0.9 % IV SOLN
INTRAVENOUS | Status: DC | PRN
  Administered 2018-12-28: 500 mL

## 2018-12-28 MED ORDER — MIDAZOLAM 50MG/50ML (1MG/ML) PREMIX INFUSION
0.5000 mg/h | INTRAVENOUS | Status: DC
  Administered 2018-12-28: 0.5 mg/h via INTRAVENOUS
  Administered 2018-12-28: 2 mg/h via INTRAVENOUS
  Filled 2018-12-28: qty 50

## 2018-12-28 MED ORDER — SODIUM CHLORIDE 0.9 % IV SOLN
INTRAVENOUS | Status: DC
  Administered 2018-12-28: 23:00:00 via INTRAVENOUS

## 2018-12-28 MED ORDER — LIDOCAINE HCL (PF) 1 % IJ SOLN
INTRAMUSCULAR | Status: AC
  Filled 2018-12-28: qty 30

## 2018-12-28 MED ORDER — ACETAMINOPHEN 325 MG PO TABS: 650.0000 mg | ORAL_TABLET | ORAL | Status: DC | PRN

## 2018-12-28 MED ORDER — ROCURONIUM BROMIDE 100 MG/10ML IV SOLN
INTRAVENOUS | Status: DC | PRN
  Administered 2018-12-28 (×2): 50 mg via INTRAVENOUS

## 2018-12-28 MED ORDER — CALCIUM CHLORIDE 10 % IV SOLN
INTRAVENOUS | Status: AC
  Filled 2018-12-28: qty 10

## 2018-12-28 MED ORDER — SODIUM CHLORIDE 0.9 % IV SOLN
INTRAVENOUS | Status: DC | PRN
  Administered 2018-12-28: 250 mL via INTRAVENOUS

## 2018-12-28 MED ORDER — AMIODARONE HCL 150 MG/3ML IV SOLN
INTRAVENOUS | Status: AC
  Filled 2018-12-28: qty 3

## 2018-12-28 MED ORDER — NOREPINEPHRINE 4 MG/250ML-% IV SOLN
0.0000 ug/min | INTRAVENOUS | Status: DC
  Administered 2018-12-28: 23:00:00 20 ug/min via INTRAVENOUS
  Administered 2018-12-29: 30 ug/min via INTRAVENOUS
  Administered 2018-12-29 (×2): 40 ug/min via INTRAVENOUS
  Administered 2018-12-29 (×2): 25 ug/min via INTRAVENOUS
  Filled 2018-12-28 (×7): qty 250

## 2018-12-28 SURGICAL SUPPLY — 61 items
BAG SNAP BAND KOVER 36X36 (MISCELLANEOUS) ×1 IMPLANT
BANDAGE ESMARK 6X9 LF (GAUZE/BANDAGES/DRESSINGS) IMPLANT
BNDG ESMARK 6X9 LF (GAUZE/BANDAGES/DRESSINGS)
CANISTER SUCT 3000ML PPV (MISCELLANEOUS) ×2 IMPLANT
CANNULA VESSEL 3MM 2 BLNT TIP (CANNULA) ×1 IMPLANT
CLIP VESOCCLUDE MED 24/CT (CLIP) ×2 IMPLANT
CLIP VESOCCLUDE MED 6/CT (CLIP) ×1 IMPLANT
CLIP VESOCCLUDE SM WIDE 24/CT (CLIP) ×2 IMPLANT
CLIP VESOCCLUDE SM WIDE 6/CT (CLIP) ×1 IMPLANT
COVER DOME SNAP 22 D (MISCELLANEOUS) ×1 IMPLANT
COVER PROBE W GEL 5X96 (DRAPES) ×1 IMPLANT
COVER WAND RF STERILE (DRAPES) ×2 IMPLANT
CUFF TOURN SGL QUICK 24 (TOURNIQUET CUFF)
CUFF TOURN SGL QUICK 34 (TOURNIQUET CUFF)
CUFF TOURN SGL QUICK 42 (TOURNIQUET CUFF) IMPLANT
CUFF TRNQT CYL 24X4X16.5-23 (TOURNIQUET CUFF) IMPLANT
CUFF TRNQT CYL 34X4.125X (TOURNIQUET CUFF) IMPLANT
DECANTER SPIKE VIAL GLASS SM (MISCELLANEOUS) IMPLANT
DERMABOND ADVANCED (GAUZE/BANDAGES/DRESSINGS)
DERMABOND ADVANCED .7 DNX12 (GAUZE/BANDAGES/DRESSINGS) IMPLANT
DRAIN HEMOVAC 1/8 X 5 (WOUND CARE) IMPLANT
DRAPE X-RAY CASS 24X20 (DRAPES) IMPLANT
DRSG ADAPTIC 3X8 NADH LF (GAUZE/BANDAGES/DRESSINGS) ×1 IMPLANT
DRSG TEGADERM 4X4.75 (GAUZE/BANDAGES/DRESSINGS) ×2 IMPLANT
ELECT REM PT RETURN 9FT ADLT (ELECTROSURGICAL) ×2
ELECTRODE REM PT RTRN 9FT ADLT (ELECTROSURGICAL) ×1 IMPLANT
EVACUATOR SILICONE 100CC (DRAIN) IMPLANT
GLOVE BIO SURGEON STRL SZ7.5 (GLOVE) ×2 IMPLANT
GOWN STRL REUS W/ TWL LRG LVL3 (GOWN DISPOSABLE) ×3 IMPLANT
GOWN STRL REUS W/TWL LRG LVL3 (GOWN DISPOSABLE) ×3
KIT BASIN OR (CUSTOM PROCEDURE TRAY) ×2 IMPLANT
KIT TURNOVER KIT B (KITS) ×2 IMPLANT
NS IRRIG 1000ML POUR BTL (IV SOLUTION) ×4 IMPLANT
PACK PERIPHERAL VASCULAR (CUSTOM PROCEDURE TRAY) ×2 IMPLANT
PAD ARMBOARD 7.5X6 YLW CONV (MISCELLANEOUS) ×4 IMPLANT
SET COLLECT BLD 21X3/4 12 (NEEDLE) IMPLANT
SET MICROPUNCTURE 5F STIFF (MISCELLANEOUS) ×1 IMPLANT
SHEATH PINNACLE 5F 10CM (SHEATH) ×1 IMPLANT
SHEATH PINNACLE 6F 10CM (SHEATH) ×1 IMPLANT
SPONGE SURGIFOAM ABS GEL 100 (HEMOSTASIS) IMPLANT
STAPLER VISISTAT 35W (STAPLE) ×1 IMPLANT
STOPCOCK 4 WAY LG BORE MALE ST (IV SETS) IMPLANT
SUT ETHILON 2 0 FS 18 (SUTURE) ×2 IMPLANT
SUT PROLENE 5 0 C 1 24 (SUTURE) ×1 IMPLANT
SUT PROLENE 6 0 CC (SUTURE) ×2 IMPLANT
SUT PROLENE 7 0 BV 1 (SUTURE) IMPLANT
SUT PROLENE 7 0 BV1 MDA (SUTURE) IMPLANT
SUT SILK 2 0 SH (SUTURE) ×1 IMPLANT
SUT SILK 3 0 (SUTURE)
SUT SILK 3-0 18XBRD TIE 12 (SUTURE) IMPLANT
SUT VIC AB 2-0 CTX 36 (SUTURE) ×2 IMPLANT
SUT VIC AB 3-0 SH 27 (SUTURE)
SUT VIC AB 3-0 SH 27X BRD (SUTURE) ×2 IMPLANT
SYR 10ML LL (SYRINGE) ×2 IMPLANT
TAPE UMBILICAL COTTON 1/8X30 (MISCELLANEOUS) IMPLANT
TOWEL GREEN STERILE (TOWEL DISPOSABLE) ×2 IMPLANT
TRAY FOLEY MTR SLVR 16FR STAT (SET/KITS/TRAYS/PACK) ×1 IMPLANT
TUBING EXTENTION W/L.L. (IV SETS) IMPLANT
UNDERPAD 30X30 (UNDERPADS AND DIAPERS) ×2 IMPLANT
WATER STERILE IRR 1000ML POUR (IV SOLUTION) ×2 IMPLANT
WIRE BENTSON .035X145CM (WIRE) ×1 IMPLANT

## 2018-12-28 SURGICAL SUPPLY — 30 items
BAG SNAP BAND KOVER 36X36 (MISCELLANEOUS) ×1 IMPLANT
BALLN WOLVERINE 2.50X10 (BALLOONS) ×2
BALLOON WOLVERINE 2.50X10 (BALLOONS) IMPLANT
CATH INFINITI 5FR MULTPACK ANG (CATHETERS) ×1 IMPLANT
CATH LAUNCHER 6FR EBU3.5 (CATHETERS) ×1 IMPLANT
CATH LAUNCHER 6FR JR4 (CATHETERS) ×1 IMPLANT
CATH SWAN GANZ VIP 7.5F (CATHETERS) ×1 IMPLANT
COVER DOME SNAP 22 D (MISCELLANEOUS) ×1 IMPLANT
GLIDESHEATH SLEND SS 6F .021 (SHEATH) ×1 IMPLANT
GUIDEWIRE INQWIRE 1.5J.035X260 (WIRE) IMPLANT
INQWIRE 1.5J .035X260CM (WIRE) ×2
KIT ENCORE 26 ADVANTAGE (KITS) ×1 IMPLANT
KIT HEART LEFT (KITS) ×2 IMPLANT
KIT HEMO VALVE WATCHDOG (MISCELLANEOUS) ×1 IMPLANT
KIT MICROPUNCTURE NIT STIFF (SHEATH) ×4 IMPLANT
PACK CARDIAC CATHETERIZATION (CUSTOM PROCEDURE TRAY) ×2 IMPLANT
SET IMPELLA CP PUMP (CATHETERS) ×1 IMPLANT
SHEATH PINNACLE 6F 10CM (SHEATH) ×3 IMPLANT
SHEATH PINNACLE 7F 10CM (SHEATH) ×1 IMPLANT
SHEATH PINNACLE 8F 10CM (SHEATH) ×1 IMPLANT
SHEATH PINNACLE MP 6F 45CM (SHEATH) ×1 IMPLANT
SHEATH PROBE COVER 6X72 (BAG) ×4 IMPLANT
SLEEVE REPOSITIONING LENGTH 30 (MISCELLANEOUS) ×1 IMPLANT
TRANSDUCER W/STOPCOCK (MISCELLANEOUS) ×2 IMPLANT
TRAY CATH 3LUMEN 20C SULFAFREE (CATHETERS) ×1 IMPLANT
TUBING CIL FLEX 10 FLL-RA (TUBING) ×3 IMPLANT
WIRE ASAHI PROWATER 180CM (WIRE) ×2 IMPLANT
WIRE EMERALD 3MM-J .035X150CM (WIRE) ×2 IMPLANT
WIRE HI TORQ VERSACORE-J 145CM (WIRE) ×1 IMPLANT
WIRE MICROINTRODUCER 60CM (WIRE) ×4 IMPLANT

## 2018-12-28 NOTE — CV Procedure (Signed)
Central Venous Catheter Insertion Procedure Note [name] [name] [id number] February 12, 1951    Procedure: Insertion of Central Venous Catheter Indications: Drug and/or fluid administration   Procedure Details Consent: Risks of procedure as well as the alternatives and risks of each were explained to the (patient/caregiver).  Consent for procedure obtained. Time Out: Verified patient identification, verified procedure, site/side was marked, verified correct patient position, special equipment/implants available, medications/allergies/relevent history reviewed, required imaging and test results available.  Performed   Maximum sterile technique was used including antiseptics, cap, gloves, gown, hand hygiene, mask and sheet. Skin prep: Chlorhexidine; local anesthetic administered  We attempted placement in the left IJ but were unable to pass the wire even under fluoro guidance due to obstruction in the left upper venous system likely due to previous port-a-cath placement.  A antimicrobial bonded/coated triple lumen catheter was placed in the right internal jugular vein using the Seldinger technique and u/s guidance.   Evaluation Blood flow good Complications: No apparent complications Patient did tolerate procedure well. Placement verified under fluoro.    Glori Bickers, MD  7:39 PM

## 2018-12-28 NOTE — Progress Notes (Signed)
Critical lab values noted to Dr Haroldine Laws orders given

## 2018-12-28 NOTE — CV Procedure (Cosign Needed)
Radial arterial line placement.    Emergent consent assumed. The right wrist was prepped and draped in the routine sterile fashion a single lumen radial arterial catheter was placed in the right radial artery using a modified Seldinger technique. Good blood flow and wave forms. A dressing was placed.     Glori Bickers, MD  7:38 PM

## 2018-12-28 NOTE — Anesthesia Postprocedure Evaluation (Signed)
Anesthesia Post Note  Patient: [name] [name]  Procedure(s) Performed: RIGHT SUPERIOR FEMORAL ARTERY CUT DOWN WITH PLACEMENT OF 6 FR SHEATH WITH EXTERNAL FEMORAL TO FEMORAL BYPASS (Right ) Right Lower Extremity Angiogram (Right )     Patient location during evaluation: ICU Anesthesia Type: General Level of consciousness: sedated and patient remains intubated per anesthesia plan Pain management: pain level controlled Vital Signs Assessment: vitals unstable Respiratory status: patient remains intubated per anesthesia plan Cardiovascular status: tachycardic and unstable (Remains on vasopressors with impella) Postop Assessment: no apparent nausea or vomiting Anesthetic complications: no    Last Vitals:  Vitals:   12/27/2018 2113 01/02/2019 2300  BP: (!) 112/104 (!) 42/27  Pulse: (!) 128   Resp: 16 16  Temp:  (!) 35.8 C    Last Pain: There were no vitals filed for this visit.               Audry Pili

## 2018-12-28 NOTE — ED Provider Notes (Signed)
[facility] EMERGENCY DEPARTMENT Provider Note   CSN: [id number] Arrival date & time:        History   Chief Complaint No chief complaint on file. STEMI  HPI [name] [name] is a 68 y.o. female.     The history is provided by the patient.  Chest Pain Pain location:  Substernal area Pain quality: aching   Pain radiates to:  Does not radiate Pain severity:  Moderate Onset quality:  Gradual Duration:  3 days Timing:  Intermittent Progression:  Unchanged Chronicity:  New Context: breathing   Relieved by:  None tried Worsened by:  Nothing Ineffective treatments:  None tried Associated symptoms: cough and fever   Associated symptoms: no abdominal pain     Past Medical History:  Diagnosis Date  . AAA (abdominal aortic aneurysm) ([place])   . Abdominal cramping 09/21/2010  . Anxiety   . Aortic aneurysm, abdominal ([place])   . Arthritis   . Cancer [facility]) 2010   right breast   mastectomy and cheotherapy  . Chronic diarrhea   . Coronary artery disease    Status post drug eluding stent to the right coronary artery status post in-stent restenosis.  DAPT  . Depression   . Diverticulosis   . GERD (gastroesophageal reflux disease)   . Hyperlipidemia   . Hypertension   . IBS (irritable bowel syndrome)     Patient Active Problem List   Diagnosis Date Noted  . AAA (abdominal aortic aneurysm) ([place]) 02/05/2013  . Abdominal aneurysm without mention of rupture 01/31/2012  . Coronary artery disease   . HX: breast cancer 08/22/2011  . HYPOTENSION 06/01/2009  . Dyslipidemia 05/11/2009  . ESSENTIAL HYPERTENSION, BENIGN 05/11/2009  . CAD 05/11/2009  . CORONARY ARTERY DISEASE, S/P PTCA 05/11/2009  . CHRONIC DIASTOLIC HEART FAILURE 17/00/1749  . COPD 05/11/2009  . IRRITABLE BOWEL SYNDROME 05/11/2009    Past Surgical History:  Procedure Laterality Date  . APPENDECTOMY  1995  . BIOPSY N/A 05/07/2014   Procedure: BIOPSY;  Surgeon: Rogene Houston, MD;  Location:  [facility];  Service: Endoscopy;  Laterality: N/A;  . CATARACT EXTRACTION W/PHACO Left 07/19/2012   Procedure: CATARACT EXTRACTION PHACO AND INTRAOCULAR LENS PLACEMENT (IOC);  Surgeon: Tonny Branch, MD;  Location: [facility];  Service: Ophthalmology;  Laterality: Left;  CDE:9.95  . CERVICAL FUSION     x6  . COLONOSCOPY    . CORONARY ANGIOPLASTY WITH STENT PLACEMENT  2000-2010   3 cardiac stent splaced per patient  . CT ANGIOGRAPHY  11/02/2010   ABD &PELV  . ESOPHAGOGASTRODUODENOSCOPY (EGD) WITH PROPOFOL N/A 05/07/2014   Procedure: ESOPHAGOGASTRODUODENOSCOPY (EGD) WITH PROPOFOL;  Surgeon: Rogene Houston, MD;  Location: [facility];  Service: Endoscopy;  Laterality: N/A;  . MALONEY DILATION N/A 05/07/2014   Procedure: MALONEY DILATION 56 cm;  Surgeon: Rogene Houston, MD;  Location: [facility];  Service: Endoscopy;  Laterality: N/A;  . MASTECTOMY  05/27/2008   RIGHT  . SMALL BOWEL GIVENS  01/06/2009     OB History   No obstetric history on file.      Home Medications    Prior to Admission medications   Medication Sig Start Date End Date Taking? Authorizing Provider  aspirin EC 81 MG tablet Take 1 tablet (81 mg total) by mouth daily. Patient taking differently: Take 81 mg by mouth every evening.  01/09/13   Serpe, Burna Forts, PA-C  carisoprodol (SOMA) 350 MG tablet Take 350 mg by mouth as needed for muscle spasms.  [provider]  carvedilol (COREG) 6.25 MG tablet Take 1 tablet (6.25 mg total) by mouth 2 (two) times daily. 06/23/16   Herminio Commons, MD  cetirizine (ZYRTEC) 10 MG chewable tablet Chew 10 mg by mouth daily.    [provider]  clopidogrel (PLAVIX) 75 MG tablet TAKE ONE TABLET BY MOUTH ONCE DAILY 07/14/16   Herminio Commons, MD  Cyanocobalamin (VITAMIN B 12 PO) Take 1,000 mcg by mouth daily.    [provider]  diazepam (VALIUM) 5 MG tablet Take 5-10 mg by mouth 2 (two) times daily. Takes 1 tab in am and 2 tablets at night 04/11/12   [provider]   dicyclomine (BENTYL) 20 MG tablet Take 1 tablet (20 mg total) by mouth 3 (three) times daily as needed for spasms. 01/05/16   Rogene Houston, MD  diphenhydrAMINE (BENADRYL) 25 MG tablet Take 25 mg by mouth daily.    [provider]  diphenoxylate-atropine (LOMOTIL) 2.5-0.025 MG tablet TAKE ONE TABLET BY MOUTH 4 TIMES DAILY AS NEEDED FOR DIARRHEA OR LOOSE STOOLS 08/11/16   Rehman, Mechele Dawley, MD  famotidine (PEPCID) 20 MG tablet Take 1 tablet (20 mg total) by mouth 2 (two) times daily. 04/15/14   Rogene Houston, MD  furosemide (LASIX) 40 MG tablet Take 1 tablet (40 mg total) by mouth daily as needed (swelling). 11/11/13   Herminio Commons, MD  gabapentin (NEURONTIN) 600 MG tablet Take 600 mg by mouth 2 (two) times daily.  12/09/10   [provider]  HYDROcodone-acetaminophen (NORCO) 7.5-325 MG tablet Take 1-2 tablets by mouth every 6 (six) hours as needed.  01/02/16   [provider]  hydrOXYzine (VISTARIL) 25 MG capsule Take 25 mg by mouth 2 (two) times daily as needed. For itching 03/13/14   [provider]  losartan (COZAAR) 50 MG tablet Take 2 tablets (100 mg total) by mouth daily. 06/02/17   Herminio Commons, MD  nitroGLYCERIN (NITROSTAT) 0.4 MG SL tablet Place 1 tablet (0.4 mg total) under the tongue every 5 (five) minutes as needed for chest pain. 01/09/13   Serpe, Burna Forts, PA-C  pantoprazole (PROTONIX) 40 MG tablet TAKE ONE TABLET BY MOUTH IN THE MORNING BEFORE  BREAKFAST 12/30/15   Rehman, Mechele Dawley, MD  pravastatin (PRAVACHOL) 40 MG tablet Take 40 mg by mouth at bedtime.  05/02/11   de Stanford Scotland, MD  Probiotic Product (PROBIOTIC DAILY PO) Take 1 capsule by mouth daily.     [provider]  promethazine (PHENERGAN) 25 MG tablet TAKE ONE TABLET BY MOUTH EVERY 6 HOURS AS NEEDED FOR NAUSEA 12/31/14   Setzer, Rona Ravens, NP  triamcinolone (NASACORT ALLERGY 24HR) 55 MCG/ACT AERO nasal inhaler Place 2 sprays into the nose daily as needed (for allergies).     [provider]  venlafaxine XR (EFFEXOR-XR) 150 MG 24 hr capsule Take 150 mg by mouth at bedtime.    [provider]  fluticasone (FLONASE) 50 MCG/ACT nasal spray Place 2 sprays into the nose as needed for allergies.  12/01/11 04/14/14  [provider]    Family History Family History  Problem Relation Age of Onset  . Diverticulitis Mother   . Heart disease Father   . Aortic aneurysm Father   . Heart disease Brother   . Aortic aneurysm Brother   . Crohn's disease Brother     Social History Social History   Tobacco Use  . Smoking status: Former Smoker    Packs/day:  1.00    Years: 45.00    Pack years: 45.00    Types: Cigarettes    Start date: 06/04/1965    Quit date: 05/16/2016    Years since quitting: 2.6  . Smokeless tobacco: Never Used  . Tobacco comment: using e cigs, but she does smoke  Substance Use Topics  . Alcohol use: No    Alcohol/week: 0.0 standard drinks  . Drug use: No     Allergies   Amlodipine besylate, Doxazosin mesylate, Erythromycin, Lisinopril, Metoprolol tartrate, Oxycodone, Penicillins, and Percocet [oxycodone-acetaminophen]   Review of Systems Review of Systems  Unable to perform ROS: Acuity of condition  Constitutional: Positive for fever.  Respiratory: Positive for cough.   Cardiovascular: Positive for chest pain.  Gastrointestinal: Negative for abdominal pain.     Physical Exam Updated Vital Signs There were no vitals taken for this visit.  Physical Exam Vitals signs and nursing note reviewed.  Constitutional:      General: She is not in acute distress.    Appearance: She is well-developed and underweight.  HENT:     Head: Normocephalic and atraumatic.  Eyes:     Conjunctiva/sclera: Conjunctivae normal.  Neck:     Musculoskeletal: Neck supple.  Cardiovascular:     Rate and Rhythm: Normal rate and regular rhythm.     Heart sounds: Murmur present.  Pulmonary:     Effort: Pulmonary effort is normal.  No respiratory distress.     Breath sounds: Normal breath sounds.  Abdominal:     Palpations: Abdomen is soft.     Tenderness: There is no abdominal tenderness.  Musculoskeletal: Normal range of motion.     Right lower leg: No edema.     Left lower leg: No edema.  Skin:    General: Skin is warm and dry.     Capillary Refill: Capillary refill takes less than 2 seconds.  Neurological:     General: No focal deficit present.     Mental Status: She is alert.      ED Treatments / Results  Labs (all labs ordered are listed, but only abnormal results are displayed) Labs Reviewed - No data to display  EKG None  Radiology No results found.  Procedures Procedures (including critical care time)  Medications Ordered in ED Medications - No data to display   Initial Impression / Assessment and Plan / ED Course  I have reviewed the triage vital signs and the nursing notes.  Pertinent labs & imaging results that were available during my care of the patient were reviewed by me and considered in my medical decision making (see chart for details).    Patient was a STEMI activation in field. I evaluated her on arrival to emergency department and she was covid swabbed per protocol. Cardiology PA was present and took the patient up to cath lab emergently     Final Clinical Impressions(s) / ED Diagnoses   Final diagnoses:  Acute ST elevation myocardial infarction (STEMI), unspecified artery [facility])  Febrile illness    ED Discharge Orders    None       Hayden Rasmussen, MD 2019/01/25 [phone]

## 2018-12-28 NOTE — Transfer of Care (Signed)
Immediate Anesthesia Transfer of Care Note  Patient: [name] [name]  Procedure(s) Performed: RIGHT SUPERIOR FEMORAL ARTERY CUT DOWN WITH PLACEMENT OF 6 FR SHEATH WITH EXTERNAL FEMORAL TO FEMORAL BYPASS (Right ) Right Lower Extremity Angiogram (Right )  Patient Location: ICU  Anesthesia Type:General  Level of Consciousness: sedated and Patient remains intubated per anesthesia plan  Airway & Oxygen Therapy: Patient remains intubated per anesthesia plan and Patient placed on Ventilator (see vital sign flow sheet for setting)  Post-op Assessment: Report given to RN and Post -op Vital signs reviewed and stable  Post vital signs: Reviewed and stable  Last Vitals:  Vitals Value Taken Time  BP 112/104 12/23/2018 2113  Temp    Pulse 128 12/17/2018 2113  Resp 16 01/14/2019 2113  SpO2      Last Pain: There were no vitals filed for this visit.       Complications: No apparent anesthesia complications

## 2018-12-28 NOTE — Anesthesia Procedure Notes (Signed)
Procedure Name: Intubation Date/Time: 01/01/2019 5:03 PM Performed by: Renato Shin, CRNA Pre-anesthesia Checklist: Patient identified, Emergency Drugs available, Suction available and Patient being monitored Patient Re-evaluated:Patient Re-evaluated prior to induction Oxygen Delivery Method: Circle system utilized Preoxygenation: Pre-oxygenation with 100% oxygen Induction Type: IV induction and Rapid sequence Ventilation: Mask ventilation without difficulty Laryngoscope Size: Miller and 2 Grade View: Grade I Tube type: Oral Tube size: 7.0 mm Number of attempts: 1 Airway Equipment and Method: Stylet and Oral airway Placement Confirmation: ETT inserted through vocal cords under direct vision,  positive ETCO2 and breath sounds checked- equal and bilateral Secured at: 21 cm Tube secured with: Tape Dental Injury: Teeth and Oropharynx as per pre-operative assessment

## 2018-12-28 NOTE — Progress Notes (Signed)
  Advanced HF Team Critical Care Note   Called to the cath lab urgently for 68 y/o woman with COPD, PAD and CAD with previous LAD & RCA stentS. Presented today with CP and inferior STEMI on ECG. Developed CP several days ago and recurred today.   Cath films showed chronic occluded RCA and high grade lesion in previous LAD stent.   Developed VF and cardiogenic shock. Underwent defibrillation and placement of Impella CP. She then underwent cutting balloon PCI of LAD.  I was called to help with management.   [place] numbers, cath films and echo reviewed personally. Echo with normal LVEF but evidence of acute sizeable VSD in midventricle.  Patient started on NE and EPI.   TCTS & Structural heart team called. Felt not to be candidate urgent surgical or percutaneous repair   Patient developed severe pain in RLE. RLE appeared mottled and ischemic. Drs. Cooper and Shell and I tried extensively to place antegrade sheath in R SFA but unable to pass wire.   Angiogram through Impella sheath showed no significant flow below Impella sheath.   [facility] called to evaluate. Case d/w Dr. Oneida Alar and plan made to go to OR urgently for cutdown on RLE to place sheath in R SFA to do external left femoral sheath to right femoral sheath bypass.   ABG showed severe acidosis. Given 2amps of bicarb placed and started on bicarb gtt. Inotoropes titrated frequently and Impella repositioned under fluoro guidance. Waveforms and output stable on P-6 without suction alarms.  I placed urgent RIJ central line and R radial arterial line to assist with shock management.   Prognosis poor. We attempted to contact family multiple times but were unsuccessful.   Total CCT was 180 minutes not including procedure time.   Glori Bickers, MD  7:37 PM

## 2018-12-28 NOTE — H&P (Addendum)
Cardiology Admission History and Physical:   Patient ID: [name] [name] MRN: [id number]; DOB: [birth year]   Admission date: 01/11/2019  Primary Care Provider: Glenda Chroman, MD Primary Cardiologist: No primary care provider on file.  Primary Electrophysiologist:  None   Chief Complaint:  Chest Pain/STEMI  Patient Profile:   [name] [name] is a 68 y.o. female with a history of CAD with prior stenting to LAD and RCA (most recent cardiac catheterization in 2014 showed stable CAD), recent CVA, hypertension, hyperlipidemia, former tobacco use,  AAA, GERD, IBS, and depression who present with chest pain and inferior STEMI on EKG in the field.  History of Present Illness:   [name] [name] is a 68 year old female CAD with prior stenting to LAD and RCA (most recent cardiac catheterization in 2014 showed stable CAD), recent CVA, hypertension, hyperlipidemia, AAA, GERD, IBS, and depression who was transferred from [facility] ED with inferior STEMI after presenting with chest pain. Patient previously seen by Dr. Bronson Ing; however, it looks like she was dismissed from our practice.   Patient reports onset of sharp stabbing chest pain 2 nights ago after eating a hot dog with associated nausea and vomiting as well as shortness of breath. Symptoms been intermittent since that time. Patient reportedly had a syncope episode this afternoon. Neighbor witnessed this and called 911. When EMS arrived patient was alert and oriented but hypotensive. EKG showed sinus tachycardia, rate 112 bpm, with ST elevations in the inferior leads and ST depression in lateral leads. CODE STEMI was called. Patient received Aspirin 324mg . She was note given any Nitro due to her hypotension and she was not started on Heparin. Patient was started on IV fluid and got about 250cc by the time she arrived to our ED. Systolic BP ranging from 97'D to 110's in EMS.  Patient denies any palpitations. She reports mild nasal congestion and cough but  denies any known exposure to the coronavirus. She was febrile upon arrival to the ED with temperature of 100.4. She reports poor PO intake over the last 2 days. She is on Aspirin and Plavix at home but states she has not taken them since Wednesday due to the nausea/vomting.   Upon arrival to our ED, patient reports 10/10 pain. She was swabbed for COVID and then transferred immediately to the cath lab for emergency cardiac catheterization.  Of note, patient a former smoker but quit about 8 years ago.  Heart Pathway Score:     Past Medical History:  Diagnosis Date  . AAA (abdominal aortic aneurysm) ([place])   . Abdominal cramping 09/21/2010  . Anxiety   . Aortic aneurysm, abdominal ([place])   . Arthritis   . Cancer [facility]) 2010   right breast   mastectomy and cheotherapy  . Chronic diarrhea   . Coronary artery disease    Status post drug eluding stent to the right coronary artery status post in-stent restenosis.  DAPT  . Depression   . Diverticulosis   . GERD (gastroesophageal reflux disease)   . Hyperlipidemia   . Hypertension   . IBS (irritable bowel syndrome)     Past Surgical History:  Procedure Laterality Date  . APPENDECTOMY  1995  . BIOPSY N/A 05/07/2014   Procedure: BIOPSY;  Surgeon: Rogene Houston, MD;  Location: [facility] ORS;  Service: Endoscopy;  Laterality: N/A;  . CATARACT EXTRACTION W/PHACO Left 07/19/2012   Procedure: CATARACT EXTRACTION PHACO AND INTRAOCULAR LENS PLACEMENT (IOC);  Surgeon: Tonny Branch, MD;  Location: [facility]S;  Service:  Ophthalmology;  Laterality: Left;  CDE:9.95  . CERVICAL FUSION     x6  . COLONOSCOPY    . CORONARY ANGIOPLASTY WITH STENT PLACEMENT  2000-2010   3 cardiac stent splaced per patient  . CT ANGIOGRAPHY  11/02/2010   ABD &PELV  . ESOPHAGOGASTRODUODENOSCOPY (EGD) WITH PROPOFOL N/A 05/07/2014   Procedure: ESOPHAGOGASTRODUODENOSCOPY (EGD) WITH PROPOFOL;  Surgeon: Rogene Houston, MD;  Location: [facility];  Service: Endoscopy;  Laterality: N/A;  . MALONEY  DILATION N/A 05/07/2014   Procedure: MALONEY DILATION 56 cm;  Surgeon: Rogene Houston, MD;  Location: [facility];  Service: Endoscopy;  Laterality: N/A;  . MASTECTOMY  05/27/2008   RIGHT  . SMALL BOWEL GIVENS  01/06/2009     Medications Prior to Admission: Prior to Admission medications   Medication Sig Start Date End Date Taking? Authorizing Provider  aspirin EC 81 MG tablet Take 1 tablet (81 mg total) by mouth daily. Patient taking differently: Take 81 mg by mouth every evening.  01/09/13   Serpe, Burna Forts, PA-C  carisoprodol (SOMA) 350 MG tablet Take 350 mg by mouth as needed for muscle spasms.    [provider]  carvedilol (COREG) 6.25 MG tablet Take 1 tablet (6.25 mg total) by mouth 2 (two) times daily. 06/23/16   Herminio Commons, MD  cetirizine (ZYRTEC) 10 MG chewable tablet Chew 10 mg by mouth daily.    [provider]  clopidogrel (PLAVIX) 75 MG tablet TAKE ONE TABLET BY MOUTH ONCE DAILY 07/14/16   Herminio Commons, MD  Cyanocobalamin (VITAMIN B 12 PO) Take 1,000 mcg by mouth daily.    [provider]  diazepam (VALIUM) 5 MG tablet Take 5-10 mg by mouth 2 (two) times daily. Takes 1 tab in am and 2 tablets at night 04/11/12   [provider]  dicyclomine (BENTYL) 20 MG tablet Take 1 tablet (20 mg total) by mouth 3 (three) times daily as needed for spasms. 01/05/16   Rogene Houston, MD  diphenhydrAMINE (BENADRYL) 25 MG tablet Take 25 mg by mouth daily.    [provider]  diphenoxylate-atropine (LOMOTIL) 2.5-0.025 MG tablet TAKE ONE TABLET BY MOUTH 4 TIMES DAILY AS NEEDED FOR DIARRHEA OR LOOSE STOOLS 08/11/16   Rehman, Mechele Dawley, MD  famotidine (PEPCID) 20 MG tablet Take 1 tablet (20 mg total) by mouth 2 (two) times daily. 04/15/14   Rogene Houston, MD  furosemide (LASIX) 40 MG tablet Take 1 tablet (40 mg total) by mouth daily as needed (swelling). 11/11/13   Herminio Commons, MD  gabapentin (NEURONTIN) 600 MG tablet Take 600 mg by mouth  2 (two) times daily.  12/09/10   [provider]  HYDROcodone-acetaminophen (NORCO) 7.5-325 MG tablet Take 1-2 tablets by mouth every 6 (six) hours as needed.  01/02/16   [provider]  hydrOXYzine (VISTARIL) 25 MG capsule Take 25 mg by mouth 2 (two) times daily as needed. For itching 03/13/14   [provider]  losartan (COZAAR) 50 MG tablet Take 2 tablets (100 mg total) by mouth daily. 06/02/17   Herminio Commons, MD  nitroGLYCERIN (NITROSTAT) 0.4 MG SL tablet Place 1 tablet (0.4 mg total) under the tongue every 5 (five) minutes as needed for chest pain. 01/09/13   Serpe, Burna Forts, PA-C  pantoprazole (PROTONIX) 40 MG tablet TAKE ONE TABLET BY MOUTH IN THE MORNING BEFORE  BREAKFAST 12/30/15   Rehman, Mechele Dawley, MD  pravastatin (PRAVACHOL) 40 MG tablet Take 40 mg by mouth  at bedtime.  05/02/11   de Stanford Scotland, MD  Probiotic Product (PROBIOTIC DAILY PO) Take 1 capsule by mouth daily.     [provider]  promethazine (PHENERGAN) 25 MG tablet TAKE ONE TABLET BY MOUTH EVERY 6 HOURS AS NEEDED FOR NAUSEA 12/31/14   Setzer, Rona Ravens, NP  triamcinolone (NASACORT ALLERGY 24HR) 55 MCG/ACT AERO nasal inhaler Place 2 sprays into the nose daily as needed (for allergies).    [provider]  venlafaxine XR (EFFEXOR-XR) 150 MG 24 hr capsule Take 150 mg by mouth at bedtime.    [provider]  fluticasone (FLONASE) 50 MCG/ACT nasal spray Place 2 sprays into the nose as needed for allergies.  12/01/11 04/14/14  [provider]     Allergies:    Allergies  Allergen Reactions  . Amlodipine Besylate Hives  . Doxazosin Mesylate Hives  . Erythromycin Hives  . Lisinopril Hives  . Metoprolol Tartrate Hives  . Oxycodone Hives and Itching    Benadryl is needed with dose  . Penicillins Hives  . Percocet [Oxycodone-Acetaminophen] Itching    Must take med with hydroxyzine    Social History:   Social History   Socioeconomic History  . Marital status:  Married    Spouse name: Not on file  . Number of children: Not on file  . Years of education: Not on file  . Highest education level: Not on file  Occupational History  . Not on file  Social Needs  . Financial resource strain: Not on file  . Food insecurity    Worry: Not on file    Inability: Not on file  . Transportation needs    Medical: Not on file    Non-medical: Not on file  Tobacco Use  . Smoking status: Former Smoker    Packs/day: 1.00    Years: 45.00    Pack years: 45.00    Types: Cigarettes    Start date: 06/04/1965    Quit date: 05/16/2016    Years since quitting: 2.6  . Smokeless tobacco: Never Used  . Tobacco comment: using e cigs, but she does smoke  Substance and Sexual Activity  . Alcohol use: No    Alcohol/week: 0.0 standard drinks  . Drug use: No  . Sexual activity: Not on file  Lifestyle  . Physical activity    Days per week: Not on file    Minutes per session: Not on file  . Stress: Not on file  Relationships  . Social Herbalist on phone: Not on file    Gets together: Not on file    Attends religious service: Not on file    Active member of club or organization: Not on file    Attends meetings of clubs or organizations: Not on file    Relationship status: Not on file  . Intimate partner violence    Fear of current or ex partner: Not on file    Emotionally abused: Not on file    Physically abused: Not on file    Forced sexual activity: Not on file  Other Topics Concern  . Not on file  Social History Narrative  . Not on file    Family History:   The patient's family history includes Aortic aneurysm in her brother and father; Crohn's disease in her brother; Diverticulitis in her mother; Heart disease in her brother and father.    ROS:  Please see the history of present illness.  Unable to obtain  full ROS due to need for emergency cardiac catheterization.  Physical Exam/Data:  There were no vitals filed for this visit. No intake or  output data in the 24 hours ending 01/07/2019 1507 Last 3 Weights 01/13/2019 06/21/2016 01/05/2016  Weight (lbs) 115 lb 138 lb 137 lb 11.2 oz  Weight (kg) 52.164 kg 62.596 kg 62.46 kg     There is no height or weight on file to calculate BMI.   Physical Exam per MD:  General: 68 y.o. female resting comfortably in no acute distress. HEENT: Normocephalic and atraumatic.  Neck: Supple.  Heart: Tachycardic with regular rhythm. Distinct S1 and S2. No murmurs, gallops, or rubs. Lungs: No increased work of breathing. Clear to ausculation bilaterally. No wheezes, rhonchi, or rales.  Abdomen: Soft, non-distended, and non-tender to palpation.   MSK: Normal strength and tone for age. Extremities: No clubbing, cyanosis, or edema.    Skin: Warm and dry. Neuro: Alert and oriented x3. No focal deficits. Psych: Normal affect. Responds appropriately.  EKG:  The ECG that was done was personally reviewed and demonstrates sinus tachycardia, rate 112 bpm, with Q waves and ST elevations in inferior leads and ST depressions in lateral leads.   Relevant CV Studies:  Cardiac Catheterization 01/18/2013: Hemodynamic Findings: Central aortic pressure: 165/80 Left ventricular pressure: 164/22/29  Angiographic Findings: - Left main: No obstructive disease.  - Left Anterior Descending Artery: Large caliber vessel that courses to the apex. The proximal vessel has a patent stent with 20% in-stent restenosis. There are several very small caliber diagonal branches.  - Circumflex Artery: Moderate caliber vessel with 30% proximal stenosis leading into a small OM branch. There is a small intermediate branch with ostial/proximal 50% stenosis. This is a 1.5-1.75 mm vessel.  - Right Coronary Artery: Moderate to large caliber dominant vessel with patent proximal stent extending back to the ostium. There is a 30% stenosis in the mid portion of the stented segment. This does not appear to flow limiting. The mid vessel has 30%  stenosis.   Left Ventricular Angiogram: LVEF=65%  Impression: 1. Stable double vessel CAD 2. Preserved LV systolic function  Recommendations: Continue medical management of CAD.   Laboratory Data:  High Sensitivity Troponin:  No results for input(s): TROPONINIHS in the last 720 hours.    Cardiac EnzymesNo results for input(s): TROPONINI in the last 168 hours. No results for input(s): TROPIPOC in the last 168 hours.  ChemistryNo results for input(s): NA, K, CL, CO2, GLUCOSE, BUN, CREATININE, CALCIUM, GFRNONAA, GFRAA, ANIONGAP in the last 168 hours.  No results for input(s): PROT, ALBUMIN, AST, ALT, ALKPHOS, BILITOT in the last 168 hours. HematologyNo results for input(s): WBC, RBC, HGB, HCT, MCV, MCH, MCHC, RDW, PLT in the last 168 hours. BNPNo results for input(s): BNP, PROBNP in the last 168 hours.  DDimer No results for input(s): DDIMER in the last 168 hours.   Radiology/Studies:  No results found.  Assessment and Plan:    [name] [name] is a 68 y.o. female with a history of CAD with prior stenting to LAD and RCA (most recent cardiac catheterization in 2014 showed stable CAD), recent CVA, hypertension, hyperlipidemia, AAA, GERD, IBS, and depression who present with chest pain and inferior STEMI on EKG in the field.  Acute STEMI - Patient presented with chest pain with associated nausea, vomiting, and shortness of breath. Patient started about 36 hours prior to presentation. - EKG shows ST elevations in inferior leads.  - Will check high-sensitivity troponin, lipid panel, and hemoglobin  A1c. - Will order Echo. - COVID-19 testing pending. - Patient on Aspirin and Plavix at home but states she has not taken her medications in the last 2 days due to nausea/vomting. - Patient taken urgently to cath lab for emergency cardiac catheterization. Further recommendation following cath.  Severity of Illness: The appropriate patient status for this patient is INPATIENT. Inpatient status  is judged to be reasonable and necessary in order to provide the required intensity of service to ensure the patient's safety. The patient's presenting symptoms, physical exam findings, and initial radiographic and laboratory data in the context of their chronic comorbidities is felt to place them at high risk for further clinical deterioration. Furthermore, it is not anticipated that the patient will be medically stable for discharge from the hospital within 2 midnights of admission. The following factors support the patient status of inpatient.   " The patient's presenting symptoms include chest pain, shortness of breath, nausea, and vomiting. " The worrisome physical exam findings include tachycardia and hypotension. " The initial radiographic and laboratory data are worrisome because of ST elevation on EKG. " The chronic co-morbidities include hypertension, hyperlipidemia, CVA.   * I certify that at the point of admission it is my clinical judgment that the patient will require inpatient hospital care spanning beyond 2 midnights from the point of admission due to high intensity of service, high risk for further deterioration and high frequency of surveillance required.*    For questions or updates, please contact [place] Please consult [email or web address] for contact info under        Signed, Darreld Mclean, PA-C  01/13/2019 3:07 PM   I have examined the patient and reviewed assessment and plan and discussed with patient.  Agree with above as stated.    I personally reviewed the ECG and made the decision for the patient to come emergently to the cath lab.   She was initially stable but quickly went into cardiogenic shock.  She required an Impella catheter followed by PCI of the LAD.  RCA was a CTO.  She was intubated after the Impella was placed.   Dr. Haroldine Laws, and Dr. Riki Sheer were consultsed and saw the patient as well.  Dr. Oneida Alar was consulted to help with right leg perfusion  post Imella.  THe patient will be headed to the OR.  I attempted to call family but was unable to reach either of the emergency contacts.   Larae Grooms

## 2018-12-28 NOTE — Anesthesia Preprocedure Evaluation (Addendum)
Anesthesia Evaluation   Patient unresponsive    Reviewed: Allergy & Precautions, Patient's Chart, lab work & pertinent test results, Unable to perform ROS - Chart review only  History of Anesthesia Complications Negative for: history of anesthetic complications  Airway Mallampati: Intubated       Dental   Pulmonary COPD,    breath sounds clear to auscultation   + intubated    Cardiovascular hypertension, Pt. on home beta blockers and Pt. on medications + CAD   Rhythm:Regular Rate:Tachycardia   Impella in place    Neuro/Psych PSYCHIATRIC DISORDERS Anxiety Depression    GI/Hepatic GERD  Medicated,  Endo/Other    Renal/GU      Musculoskeletal  (+) Arthritis ,   Abdominal   Peds  Hematology   Anesthesia Other Findings   Reproductive/Obstetrics                          Anesthesia Physical Anesthesia Plan  ASA: IV and emergent  Anesthesia Plan: General   Post-op Pain Management:    Induction: Inhalational  PONV Risk Score and Plan: 3 and Treatment may vary due to age or medical condition  Airway Management Planned: Oral ETT  Additional Equipment:   Intra-op Plan:   Post-operative Plan: Post-operative intubation/ventilation  Informed Consent:     History available from chart only  Plan Discussed with: CRNA, Anesthesiologist and Surgeon  Anesthesia Plan Comments:        Anesthesia Quick Evaluation

## 2018-12-28 NOTE — Progress Notes (Signed)
Pharmacy Antibiotic Note  [name] [name] is a 68 y.o. female admitted on 01/14/2019 with STEMI s/p Impella pump placement and RLE revascularization.  Pharmacy has been consulted for Vancomycin and Cefepime dosing.  Plan: Vancomycin 1 g IV now F/U renal function and redose as indicated Cefepime 1 g IV q24h   Height: 5\' 3"  (160 cm) IBW/kg (Calculated) : 52.4  Temp (24hrs), Avg:96.4 F (35.8 C), Min:96.4 F (35.8 C), Max:96.4 F (35.8 C)  Recent Labs  Lab 12/17/2018 1445 01/04/2019 1636 01/12/2019 1912 12/24/2018 2128 01/13/2019 2134  WBC  --  21.8*  --   --  17.6*  CREATININE 1.30* 1.55*  --   --  2.04*  LATICACIDVEN  --   --  9.4* >11.0*  --     Estimated Creatinine Clearance: 21.1 mL/min (A) (by C-G formula based on SCr of 2.04 mg/dL (H)).    Allergies  Allergen Reactions  . Amlodipine Besylate Hives  . Doxazosin Mesylate Hives  . Erythromycin Hives  . Lisinopril Hives  . Metoprolol Tartrate Hives  . Oxycodone Hives and Itching    Benadryl is needed with dose  . Penicillins Hives  . Percocet [Oxycodone-Acetaminophen] Itching    Must take med with hydroxyzine    Caryl Pina 12/27/2018 11:23 PM

## 2018-12-28 NOTE — Op Note (Addendum)
Procedure: Right superficial femoral artery cutdown with insertion of 6 French arterial sheath and intraoperative arteriogram  Preoperative diagnosis: Acute ischemia right lower extremity  Postoperative diagnosis: Same  Anesthesia: General  Assistant: Luanne Bras, RNFA  Indications: Patient is a 68 year old female who earlier today had an Impella placed for an acute MI.  She has an acutely ischemic right leg.  The Impella cannot be removed because of her poor cardiac function.  I was asked by the cardiology service to obtain open access to the right superficial femoral artery so that a sheath from the left common femoral artery could be connected to the right superficial femoral artery for right leg perfusion.  Operative details: The patient was taken to the operating room with implied consent.  Patient's entire right lower extremities prepped and draped in usual sterile fashion.  A longitudinal incision was made just anterior to the border of the right sartorius muscle and carried down through subtendinous tissue and the sartorius reflected posteriorly.  The superficial femoral artery had some thickening to it but overall had soft portions.  A micropuncture needle was used to access the artery and the micropuncture wire advanced into the superficial femoral artery under fluoroscopic guidance.  Micropuncture sheath was then placed over this.  The micropuncture wire was removed and exchanged for an 035 Bentson wire.  The micropuncture sheath was then removed and exchanged for a 6 French sheath.  I then performed a contrast angiogram so that we knew with the patient's outflow anatomy looked like.  The entire right superficial femoral artery and popliteal artery was patent.  The patient has moderately severe tibial disease.  The anterior tibial artery is patent all the way level of foot.  Tibioperoneal trunk is patent.  The posterior tibial and peroneal arteries are occluded.  At this point using a female  to female connector the 6 French sheath in the right superficial femoral artery was connected to a pre-existing sheath in the left common femoral artery.  Everything was de-aired.  Flow was then restored to the right lower extremity.  The sheath was placed in a position where it was laying underneath the muscle belly of the sartorius.  The skin was then stapled so that the side-port was exiting the skin.  The patient tolerated procedure well and there were no complications.  The patient was taken back to the intensive care unit in critical condition.  Ruta Hinds, MD [facility] Office: [phone] Pager: [phone]

## 2018-12-28 NOTE — Progress Notes (Signed)
Echocardiogram 2D Echocardiogram has been performed.  Oneal Deputy Lanorris Kalisz 01/12/2019, 5:54 PM

## 2018-12-28 NOTE — Progress Notes (Signed)
  Patient developed and ischemic leg due to Impella pump in RFA.   Angiogram in cath lab showed complete occlusion of FA.   I accompanied patient to OR with Dr. Oneida Alar to manage Impella and Inotropes.   I then accompanied patient back to CCU.  I performed bedside u/s to confirm position of pump. Inflow port at 3.7 cm from Aov with good position in midventricle. Flows and waveforms stable.   ABG with ph 7.13 so I gave another 2 amps bicarb and restarted bicarb gtt.   Percutaneous fem-fem bypass working well with improved perfusion of RLE.  ACT at 235 and heparin started.   Labs pending. 4u RBCs on hold for transfusion as needed.   Remains critically ill on NE/Epi and Impella at P-6.   Additional 90 mins CCT.  Glori Bickers, MD  9:55 PM

## 2018-12-29 DIAGNOSIS — R570 Cardiogenic shock: Secondary | ICD-10-CM

## 2018-12-29 LAB — POCT ACTIVATED CLOTTING TIME
Activated Clotting Time: 235 seconds
Activated Clotting Time: 213 seconds
Activated Clotting Time: 219 seconds
Activated Clotting Time: 219 seconds
Activated Clotting Time: 208 seconds
Activated Clotting Time: 208 seconds
Activated Clotting Time: 230 seconds

## 2018-12-29 LAB — POCT I-STAT 7, (LYTES, BLD GAS, ICA,H+H)
Acid-Base Excess: 2 mmol/L (ref 0.0–2.0)
Acid-base deficit: 9 mmol/L — ABNORMAL HIGH (ref 0.0–2.0)
Acid-base deficit: 16 mmol/L — ABNORMAL HIGH (ref 0.0–2.0)
Bicarbonate: 17.2 mmol/L — ABNORMAL LOW (ref 20.0–28.0)
Bicarbonate: 12.1 mmol/L — ABNORMAL LOW (ref 20.0–28.0)
Bicarbonate: 26.7 mmol/L (ref 20.0–28.0)
Calcium, Ion: 0.8 mmol/L — CL (ref 1.15–1.40)
Calcium, Ion: 0.87 mmol/L — CL (ref 1.15–1.40)
Calcium, Ion: 0.74 mmol/L — CL (ref 1.15–1.40)
HCT: 25 % — ABNORMAL LOW (ref 36.0–46.0)
HCT: 23 % — ABNORMAL LOW (ref 36.0–46.0)
HCT: 26 % — ABNORMAL LOW (ref 36.0–46.0)
Hemoglobin: 8.5 g/dL — ABNORMAL LOW (ref 12.0–15.0)
Hemoglobin: 7.8 g/dL — ABNORMAL LOW (ref 12.0–15.0)
Hemoglobin: 8.8 g/dL — ABNORMAL LOW (ref 12.0–15.0)
O2 Saturation: 100 %
O2 Saturation: 99 %
O2 Saturation: 100 %
Patient temperature: 35
Patient temperature: 36.5
Potassium: 4.5 mmol/L (ref 3.5–5.1)
Potassium: 3.7 mmol/L (ref 3.5–5.1)
Potassium: 3.4 mmol/L — ABNORMAL LOW (ref 3.5–5.1)
Sodium: 140 mmol/L (ref 135–145)
Sodium: 143 mmol/L (ref 135–145)
Sodium: 151 mmol/L — ABNORMAL HIGH (ref 135–145)
TCO2: 18 mmol/L — ABNORMAL LOW (ref 22–32)
TCO2: 13 mmol/L — ABNORMAL LOW (ref 22–32)
TCO2: 28 mmol/L (ref 22–32)
pCO2 arterial: 32.7 mmHg (ref 32.0–48.0)
pCO2 arterial: 35.3 mmHg (ref 32.0–48.0)
pCO2 arterial: 44.3 mmHg (ref 32.0–48.0)
pH, Arterial: 7.318 — ABNORMAL LOW (ref 7.350–7.450)
pH, Arterial: 7.14 — CL (ref 7.350–7.450)
pH, Arterial: 7.389 (ref 7.350–7.450)
pO2, Arterial: 467 mmHg — ABNORMAL HIGH (ref 83.0–108.0)
pO2, Arterial: 162 mmHg — ABNORMAL HIGH (ref 83.0–108.0)
pO2, Arterial: 221 mmHg — ABNORMAL HIGH (ref 83.0–108.0)

## 2018-12-29 LAB — LACTIC ACID, PLASMA
Lactic Acid, Venous: >11 mmol/L (ref 0.5–1.9)
Lactic Acid, Venous: >11 mmol/L (ref 0.5–1.9)

## 2018-12-29 LAB — BASIC METABOLIC PANEL
Anion gap: 34 — ABNORMAL HIGH (ref 5–15)
BUN: 26 mg/dL — ABNORMAL HIGH (ref 8–23)
CO2: 11 mmol/L — ABNORMAL LOW (ref 22–32)
Calcium: 7.1 mg/dL — ABNORMAL LOW (ref 8.9–10.3)
Chloride: 101 mmol/L (ref 98–111)
Creatinine, Ser: 2.34 mg/dL — ABNORMAL HIGH (ref 0.44–1.00)
GFR calc Af Amer: 24 mL/min — ABNORMAL LOW (ref >60)
GFR calc non Af Amer: 20 mL/min — ABNORMAL LOW (ref >60)
Glucose, Bld: 257 mg/dL — ABNORMAL HIGH (ref 70–99)
Potassium: 4.1 mmol/L (ref 3.5–5.1)
Sodium: 146 mmol/L — ABNORMAL HIGH (ref 135–145)

## 2018-12-29 LAB — COOXEMETRY PANEL
Carboxyhemoglobin: 0.4 % — ABNORMAL LOW (ref 0.5–1.5)
Methemoglobin: 1.1 % (ref 0.0–1.5)
O2 Saturation: 60 %
Total hemoglobin: 7.7 g/dL — ABNORMAL LOW (ref 12.0–16.0)

## 2018-12-29 LAB — CBC
HCT: 27.6 % — ABNORMAL LOW (ref 36.0–46.0)
Hemoglobin: 8.3 g/dL — ABNORMAL LOW (ref 12.0–15.0)
MCH: 30.6 pg (ref 26.0–34.0)
MCHC: 30.1 g/dL (ref 30.0–36.0)
MCV: 101.8 fL — ABNORMAL HIGH (ref 80.0–100.0)
Platelets: 80 10*3/uL — ABNORMAL LOW (ref 150–400)
RBC: 2.71 MIL/uL — ABNORMAL LOW (ref 3.87–5.11)
RDW: 14.6 % (ref 11.5–15.5)
WBC: 16.8 10*3/uL — ABNORMAL HIGH (ref 4.0–10.5)
nRBC: 0.3 % — ABNORMAL HIGH (ref 0.0–0.2)

## 2018-12-29 LAB — PREPARE RBC (CROSSMATCH)

## 2018-12-29 LAB — MRSA PCR SCREENING: MRSA by PCR: NEGATIVE

## 2018-12-29 MED ORDER — SODIUM BICARBONATE 8.4 % IV SOLN: 100.0000 meq | Freq: Once | INTRAVENOUS | Status: AC

## 2018-12-29 MED ORDER — SODIUM BICARBONATE 8.4 % IV SOLN
INTRAVENOUS | Status: AC
  Administered 2018-12-29: 50 meq
  Filled 2018-12-29: qty 50

## 2018-12-29 MED ORDER — SODIUM CHLORIDE 0.9% FLUSH: 10.0000 mL | INTRAVENOUS | Status: DC | PRN

## 2018-12-29 MED ORDER — SODIUM CHLORIDE 0.9% FLUSH: 10.0000 mL | Freq: Two times a day (BID) | INTRAVENOUS | Status: DC

## 2018-12-29 MED ORDER — SODIUM BICARBONATE 8.4 % IV SOLN
INTRAVENOUS | Status: AC
  Filled 2018-12-29: qty 50

## 2018-12-29 MED ORDER — CHLORHEXIDINE GLUCONATE 0.12% ORAL RINSE (MEDLINE KIT)
15.0000 mL | Freq: Two times a day (BID) | OROMUCOSAL | Status: DC
  Administered 2018-12-29: 15 mL via OROMUCOSAL

## 2018-12-29 MED ORDER — SODIUM BICARBONATE-DEXTROSE 150-5 MEQ/L-% IV SOLN
150.0000 meq | INTRAVENOUS | Status: DC
  Filled 2018-12-29 (×3): qty 1000

## 2018-12-29 MED ORDER — SODIUM CHLORIDE 0.9% IV SOLUTION: Freq: Once | INTRAVENOUS | Status: DC

## 2018-12-29 MED ORDER — ORAL CARE MOUTH RINSE
15.0000 mL | OROMUCOSAL | Status: DC
  Administered 2018-12-29 (×4): 15 mL via OROMUCOSAL

## 2018-12-29 MED ORDER — EPINEPHRINE 1 MG/10ML IJ SOSY
PREFILLED_SYRINGE | INTRAMUSCULAR | Status: AC
  Filled 2018-12-29: qty 10

## 2018-12-29 MED ORDER — CHLORHEXIDINE GLUCONATE CLOTH 2 % EX PADS: 6.0000 | MEDICATED_PAD | Freq: Every day | CUTANEOUS | Status: DC

## 2018-12-29 MED ORDER — SODIUM BICARBONATE 8.4 % IV SOLN
INTRAVENOUS | Status: AC
  Administered 2018-12-29: 05:00:00 50 meq
  Filled 2018-12-29: qty 50

## 2018-12-31 ENCOUNTER — Encounter (HOSPITAL_COMMUNITY): Payer: Self-pay | Admitting: Interventional Cardiology

## 2018-12-31 LAB — TYPE AND SCREEN
ABO/RH(D): A POS
Antibody Screen: NEGATIVE
Unit division: 0
Unit division: 0
Unit division: 0
Unit division: 0

## 2018-12-31 LAB — BPAM RBC
Blood Product Expiration Date: 202009112359
Blood Product Expiration Date: 202009112359
Blood Product Expiration Date: 202009112359
Blood Product Expiration Date: 202009112359
ISSUE DATE / TIME: 202008142019
ISSUE DATE / TIME: 202008142019
ISSUE DATE / TIME: 202008150428
ISSUE DATE / TIME: 202008150530
Unit Type and Rh: 6200
Unit Type and Rh: 6200
Unit Type and Rh: 6200
Unit Type and Rh: 6200

## 2018-12-31 MED FILL — Amiodarone HCl in Dextrose 4.14% IV Soln 360 MG/200ML: INTRAVENOUS | Qty: 200 | Status: AC

## 2018-12-31 MED FILL — Nitroglycerin IV Soln 100 MCG/ML in D5W: INTRA_ARTERIAL | Qty: 10 | Status: AC

## 2019-01-01 ENCOUNTER — Telehealth (HOSPITAL_COMMUNITY): Payer: Self-pay | Admitting: Cardiology

## 2019-01-01 NOTE — Telephone Encounter (Signed)
[employer] death certificate received Completed,Signed, and faxed back to funeral home @ [phone]

## 2019-01-11 NOTE — Telephone Encounter (Signed)
Original DC received in mail, form completed and signed by Dr Haroldine Laws and mailed to [facility].

## 2019-01-15 NOTE — Progress Notes (Addendum)
I was called at 730 this morning due to worsening patient condition.  She is a 68 year old female admitted with acute myocardial infarction and was also found to have a post myocardial infarction VSD.  She has an Impella in place and is on multiple pressors.  She has a severe metabolic acidosis and is on a bicarb drip.  She has an ischemic right lower extremity.  Patient's prognosis is extremely poor.  I tried multiple telephone numbers to contact family members including Everardo All both home and mobile numbers.  I also tried to contact patient's mobile phone.  I attempted to call Reggy Eye who is patient's son.  There was either no answer or mailbox was full and unable to leave voicemail.  We were then able to contact Melvern Sample who is patient's brother.  I explained that patient is in critical condition and would likely not survive.  He explained to me that her husband recently passed away.  She apparently does not have significant contact with her son from previous marriage.  We tried to contact patient's ex-husband Lelan Pons without success.  Patient's brother stated he would also try to contact other family members. Critical care time 45 min (7:30 to 8:00 AM). Kirk Ruths

## 2019-01-15 NOTE — Procedures (Signed)
Extubation Procedure Note  Patient Details:   Name: KAMBRI DISMORE DOB: 04/10/1951 MRN: [id number]   Airway Documentation:    Vent end date: 01/08/19 Vent end time: 1745   Evaluation  O2 sats: transiently fell during during procedure Complications: No apparent complications Patient did not tolerate procedure well. Bilateral Breath Sounds: Rhonchi   No   Patient was terminally extubated to room air with RN & family at the bedside.   Claretta Fraise 01/08/2019, 5:45 PM

## 2019-01-15 NOTE — Progress Notes (Signed)
[facility]  Subjective  - sedated on vent   Objective (!) 81/60 (!) 106 (!) 97.5 F (36.4 C) (Core) 16 100%  Intake/Output Summary (Last 24 hours) at 22-Jan-2019 0918 Last data filed at 01/22/2019 0900 Gross per 24 hour  Intake 4027.52 ml  Output 345 ml  Net 3682.52 ml   No sheath bleeding right leg  Assessment/Planning: Sheaths perfusing leg Prognosis and events noted Call if questions  Ruta Hinds 01/22/19 9:18 AM --  Laboratory Lab Results: Recent Labs    12/27/2018 2134  2019-01-22 0333 2019-01-22 0335 01/22/2019 0733  WBC 17.6*  --  16.8*  --   --   HGB 9.1*   < > 8.3* 7.8* 8.8*  HCT 30.2*   < > 27.6* 23.0* 26.0*  PLT 116*  --  80*  --   --    < > = values in this interval not displayed.   BMET Recent Labs    01/03/2019 2134  22-Jan-2019 0333 22-Jan-2019 0335 01-22-19 0733  NA 141   < > 146* 143 151*  K 4.4   < > 4.1 3.7 3.4*  CL 106  --  101  --   --   CO2 10*  --  11*  --   --   GLUCOSE 158*  --  257*  --   --   BUN 27*  --  26*  --   --   CREATININE 2.04*  --  2.34*  --   --   CALCIUM 8.6*  --  7.1*  --   --    < > = values in this interval not displayed.    COAG Lab Results  Component Value Date   INR 4.3 (HH) 01/11/2019   No results found for: PTT

## 2019-01-15 NOTE — Discharge Summary (Addendum)
Advanced Heart Failure Team  Death Summary   Patient ID: [name] [name] MRN: [id number], DOB/AGE: Oct 06, 1950 68 y.o. Admit date: 12/24/2018 D/C date:     2019/01/14   Primary Discharge Diagnoses:  1. Acute inferior STEMI 2. CAD 3. Acute ventricular septal defect 4. Cardiogenic shock 5. PAD with critical limb ischemia 6. Acute hypoxic respiratory failure 7. Acute blood loss anemia 8. Ventricular fibrillation arrest   Hospital Course:   [name] was a 68 y/o woman with COPD, PAD and CAD with previous LAD & RCA stents. She developed severe CP, nausea and diaphoresis several days prior to admission while eating a hot dog. She did not seek medical care at that time. She then presented to [facility] ED on 8/14 with recurrent CP. ECG showed inferior ST elevation. Transferred emergently to [facility] cath lab.  Urgent cath showed what appeared to be a chronically occluded RCA and high grade lesion in previous LAD stent. Developed VF arrest and cardiogenic shock. Underwent defibrillation and placement of Impella CP. She then underwent cutting balloon PCI of LAD. RHC showed PA sats in 90%s so stat echo done in cath lab which showed an acute midventricular VSD. Patient started on epi and NE for BP support and bicarb gtt for worsening acidosis.   TCTS consulted urgently and patient not felt to be candidate for OR due to severe shock and comorbidities. On cath table developed severe ischemic RLE pain below Impella site and also developed respiratory failure. Intubated emergently by anesthesia team.   Angiogram through Impella sheath showed no significant arterial flow below Impella sheath. We attempted multiple times to place a antegrade perfusion catheter down RLE but were unable to obtain access given lack of flow and inability to pass a wire. .   VVS called to evaluate. Dr. Oneida Alar took patient to OR urgently for cutdown on RLE and antegrade sheath placed in R SFA for external left femoral sheath  to right femoral sheath bypass. Patient transferred to CCU.  On arrival to CCU, patient with persistent severe shock and acidosis despite bicarb gtt and full support. She deteriorated throughout the night with increasing shock and pressor requirements. We were finally able to reach family on 8/15 and they were clear that patient would not heroic measures. She was started on comfort drips and terminally extubated and passed shortly thereafter.      Treasa School MD 01/01/2019, 10:01 PM

## 2019-01-15 NOTE — Progress Notes (Signed)
Advanced Heart Failure Rounding Note   Subjective:    Patient admitted last night from cath lab with inferior stemi and post-MI VSD complicated by shock. Impella placed for support but developed ischemic RLE and required trip to OR for RLE fem-fem percutaneous bypass.   Remains critically ill Intubated and sedated. On NE/Epi and bicarb gtt. Having increased pressor requirements today.   Rec'd 2u of RBC overnight. Lactate > 11 persistently  Impella P-6 flows 2.5. Waveform stable. Device visualized directly under echo and tip slightly deep at 4.7cm so pulled back slightly.   Swam with   PA 25/16  CO unreliable due to VSD Co-ox 60%  I did bedside echo EF 50% RV severely HK. Impella placement as above.    Objective:   Weight Range:  Vital Signs:   Temp:  [95.9 F (35.5 C)-98.1 F (36.7 C)] 97.5 F (36.4 C) (08/15 0800) Pulse Rate:  [83-128] 83 (08/15 0410) Resp:  [14-18] 16 (08/15 0800) BP: (34-112)/(23-104) 81/60 (08/15 0800) SpO2:  [81 %-100 %] 100 % (08/15 0410) Arterial Line BP: (71-104)/(67-100) 86/81 (08/15 0800) FiO2 (%):  [70 %-100 %] 70 % (08/15 0805) Weight:  [52.2 kg] 52.2 kg (08/14 1434)    Weight change: There were no vitals filed for this visit.  Intake/Output:   Intake/Output Summary (Last 24 hours) at Jan 09, 2019 0820 Last data filed at 01/09/19 0700 Gross per 24 hour  Intake 3985.82 ml  Output 345 ml  Net 3640.82 ml     Physical Exam: General:  Critically ill appearing. Intubated/sedated HEENT: normal Neck: supple. JVP to jaw . RIJ TLC Carotids 2+ bilat; no bruits. No lymphadenopathy or thryomegaly appreciated. Cor: PMI nondisplaced.Irregular impella hum Lungs: coarse Abdomen: soft, nontender, nondistended. No hepatosplenomegaly. No bruits or masses. Good bowel sounds. Extremities: mottled in R groin around impella sheath and lower part of LLE  Neuro: intubated/sedated   Telemetry: Sinus 90-110 Personally reviewed  ECG sinus tach 105  with wenckebach persistent ST elevation in inferior leads Personally reviewed   Labs: Basic Metabolic Panel: Recent Labs  Lab 12/30/2018 1445  12/16/2018 1636  12/18/2018 2134 01/09/2019 2142 12/24/2018 2259 01-09-2019 0333 01-09-2019 0335  NA 135   < > 135   < > 141 142 144 146* 143  K 3.6   < > 4.3   < > 4.4 4.2 4.0 4.1 3.7  CL 99  --  103  --  106  --   --  101  --   CO2  --   --  15*  --  10*  --   --  11*  --   GLUCOSE 145*  --  201*  --  158*  --   --  257*  --   BUN 27*  --  29*  --  27*  --   --  26*  --   CREATININE 1.30*  --  1.55*  --  2.04*  --   --  2.34*  --   CALCIUM  --   --  7.7*  --  8.6*  --   --  7.1*  --    < > = values in this interval not displayed.    Liver Function Tests: Recent Labs  Lab 12/30/2018 1636 01/05/2019 2134  AST 382* 958*  ALT 125* 461*  ALKPHOS 68 79  BILITOT 1.5* 2.3*  PROT 5.6* 4.5*  ALBUMIN 2.6* 2.1*   No results for input(s): LIPASE, AMYLASE in the last 168 hours. No results  for input(s): AMMONIA in the last 168 hours.  CBC: Recent Labs  Lab 01/10/2019 1636  12/23/2018 2134 12/26/2018 2142 01/01/2019 2259 01/19/19 0333 2019-01-19 0335  WBC 21.8*  --  17.6*  --   --  16.8*  --   NEUTROABS 17.5*  --   --   --   --   --   --   HGB 10.1*   < > 9.1* 8.8* 8.2* 8.3* 7.8*  HCT 31.0*   < > 30.2* 26.0* 24.0* 27.6* 23.0*  MCV 92.0  --  100.3*  --   --  101.8*  --   PLT 218  --  116*  --   --  80*  --    < > = values in this interval not displayed.    Cardiac Enzymes: No results for input(s): CKTOTAL, CKMB, CKMBINDEX, TROPONINI in the last 168 hours.  BNP: BNP (last 3 results) No results for input(s): BNP in the last 8760 hours.  ProBNP (last 3 results) No results for input(s): PROBNP in the last 8760 hours.    Other results:  Imaging: Hybrid Or Imaging (mc Only)  Result Date: 12/30/2018 There is no interpretation for this exam.  This order is for images obtained during a surgical procedure.  Please See "Surgeries" Tab for more information  regarding the procedure.      Medications:     Scheduled Medications: . sodium chloride   Intravenous Once  . aspirin  81 mg Oral Daily  . chlorhexidine gluconate (MEDLINE KIT)  15 mL Mouth Rinse BID  . Chlorhexidine Gluconate Cloth  6 each Topical Daily  . EPINEPHrine      . mouth rinse  15 mL Mouth Rinse 10 times per day  . sodium bicarbonate      . sodium chloride flush  10-40 mL Intracatheter Q12H  . sodium chloride flush  3 mL Intravenous Q12H  . vancomycin variable dose per unstable renal function (pharmacist dosing)   Does not apply See admin instructions     Infusions: . sodium chloride    . ceFEPime (MAXIPIME) IV 1 g (01-19-19 0106)  . epinephrine 5 mcg/min (01/19/2019 0700)  . fentaNYL infusion INTRAVENOUS 25 mcg/hr (2019-01-19 0700)  . impella catheter heparin 50 unit/mL in dextrose 5% 50,000 Units (01/03/2019 2240)  . heparin    . midazolam 0.5 mg/hr (2019/01/19 0700)  . norepinephrine (LEVOPHED) Adult infusion 25 mcg/min (2019-01-19 0700)  . sodium bicarbonate 150 mEq in dextrose 5% 1000 mL 125 mL/hr at 19-Jan-2019 0700  . vasopressin (PITRESSIN) infusion - *FOR SHOCK*       PRN Medications:  sodium chloride, acetaminophen, ondansetron (ZOFRAN) IV, sodium chloride flush, sodium chloride flush   Assessment/Plan:   1. Cardiogenic shock 2. CAD with acute inferolateral/RV MI 3. Post-MI VSD 4. Critical limb ischemia 5. Acute hypoxic respiratory failure 6. Acute renal failure 7. Shock liver  6. Acute blood loss anemia  She is critically ill with MSOF in setting of late-presenting MI with acute VSD and RV infarct. Situation complicated by critical limb ischemia and MSOF. Despite appearance of RCA on cath suspect RCA is IRA.  After multiple attempts Dr. Stanford Breed finally reached her family. And her niece, Olean Ree Long who is a Marine scientist in [place] and closest relative that are still speaking with her. She is on her way to see her Aunt now and said that the patient would  never want aggressive life support measures. We will continue current care and likely proceed with transition  to comfort care once family arrives. DNR now.   CRITICAL CARE Performed by: Glori Bickers  Total critical care time: 45 minutes  Critical care time was exclusive of separately billable procedures and treating other patients.  Critical care was necessary to treat or prevent imminent or life-threatening deterioration.  Critical care was time spent personally by me (independent of midlevel providers or residents) on the following activities: development of treatment plan with patient and/or surrogate as well as nursing, discussions with consultants, evaluation of patient's response to treatment, examination of patient, obtaining history from patient or surrogate, ordering and performing treatments and interventions, ordering and review of laboratory studies, ordering and review of radiographic studies, pulse oximetry and re-evaluation of patient's condition.  Length of Stay: 1   Glori Bickers MD Jan 12, 2019, 8:20 AM  Advanced Heart Failure Team Pager [phone] (M-F; [place])  Please contact [facility] for night-coverage after hours (4p -7a ) and weekends on [email or web address]

## 2019-01-15 NOTE — Progress Notes (Signed)
   Paged to the bedside. Family (next of kin) has arrived and wishes the patient to be full comfort care. This plan was discussed with Dr. Haroldine Laws this morning (see his detailed notes). Inotropes had been held when I arrived. Will order d/c impella and extubate.  Comfort measures only. Anticipate imminent death. Notified Dr. Haroldine Laws.  Pixie Casino, MD, [facility], [place] Director of the Advanced Lipid Disorders &  Cardiovascular Risk Reduction Clinic Diplomate of the American Board of Clinical Lipidology Attending Cardiologist  Direct Dial: [phone]  Fax: [phone]  Website:  [email or web address]

## 2019-01-15 NOTE — Progress Notes (Signed)
Patients family unable to be reached by phone

## 2019-01-15 NOTE — Progress Notes (Signed)
ANTICOAGULATION CONSULT NOTE - Initial Consult  Pharmacy Consult for heparin  Indication: impella  Allergies  Allergen Reactions  . Amlodipine Besylate Hives  . Doxazosin Mesylate Hives  . Erythromycin Hives  . Lisinopril Hives  . Metoprolol Tartrate Hives  . Oxycodone Hives and Itching    Benadryl is needed with dose  . Penicillins Hives  . Percocet [Oxycodone-Acetaminophen] Itching    Must take med with hydroxyzine    Patient Measurements: Height: 5\' 3"  (160 cm) IBW/kg (Calculated) : 52.4   Vital Signs: Temp: 98.4 F (36.9 C) (08/15 1300) Temp Source: Core (08/15 0800) BP: 80/28 (08/15 1300) Pulse Rate: 106 (08/15 1119)  Labs: Recent Labs    12/15/2018 1636  12/19/2018 2128 01/14/2019 2134  01/04/2019 0333 2019/01/04 0335 2019-01-04 0733  HGB 10.1*   < >  --  9.1*   < > 8.3* 7.8* 8.8*  HCT 31.0*   < >  --  30.2*   < > 27.6* 23.0* 26.0*  PLT 218  --   --  116*  --  80*  --   --   APTT  --   --   --  >200*  --   --   --   --   LABPROT  --   --   --  40.5*  --   --   --   --   INR  --   --   --  4.3*  --   --   --   --   CREATININE 1.55*  --   --  2.04*  --  2.34*  --   --   TROPONINIHS >27,000.0*  --  >27,000*  --   --   --   --   --    < > = values in this interval not displayed.    Estimated Creatinine Clearance: 18.4 mL/min (A) (by C-G formula based on SCr of 2.34 mg/dL (H)).   Medical History: Past Medical History:  Diagnosis Date  . AAA (abdominal aortic aneurysm) ([place])   . Abdominal cramping 09/21/2010  . Anxiety   . Aortic aneurysm, abdominal ([place])   . Arthritis   . Cancer [facility]) 2010   right breast   mastectomy and cheotherapy  . Chronic diarrhea   . Coronary artery disease    Status post drug eluding stent to the right coronary artery status post in-stent restenosis.  DAPT  . Depression   . Diverticulosis   . GERD (gastroesophageal reflux disease)   . Hyperlipidemia   . Hypertension   . IBS (irritable bowel syndrome)      Assessment: 11yof s/p  STEMI and POBA to LAD in cardiogenic shock.  Impella place for support heparin in purge solution running 9ml/hr = 700 uts/hr of heparin ACTs 200s > goal without systemic heparin - cbc ok no bleeding - continue to monitor   Goal of Therapy:  ACT 160-180 sec  Monitor platelets by anticoagulation protocol: Yes   Plan:  Continue current plan for now  [employer].D. CPP, BCPS Clinical Pharmacist [phone] 01/04/19 3:43 PM

## 2019-01-15 NOTE — Progress Notes (Signed)
EKG CRITICAL VALUE     12 lead EKG performed.  Critical value noted. Margreta Journey, RN notified.   Cherie Lasalle L, CCT 01/12/2019 9:13 AM

## 2019-01-15 DEATH — deceased
